# Patient Record
Sex: Female | Born: 1950
Health system: Southern US, Community
[De-identification: ages and names within clinical notes are randomized; demographics above are authoritative.]

## PROBLEM LIST (undated history)

## (undated) DIAGNOSIS — R600 Localized edema: Secondary | ICD-10-CM

## (undated) DIAGNOSIS — M5430 Sciatica, unspecified side: Secondary | ICD-10-CM

## (undated) DIAGNOSIS — R7303 Prediabetes: Secondary | ICD-10-CM

## (undated) DIAGNOSIS — M2550 Pain in unspecified joint: Secondary | ICD-10-CM

## (undated) DIAGNOSIS — M1990 Unspecified osteoarthritis, unspecified site: Secondary | ICD-10-CM

## (undated) DIAGNOSIS — Z91018 Allergy to other foods: Secondary | ICD-10-CM

## (undated) DIAGNOSIS — T7840XA Allergy, unspecified, initial encounter: Secondary | ICD-10-CM

## (undated) DIAGNOSIS — I10 Essential (primary) hypertension: Secondary | ICD-10-CM

## (undated) DIAGNOSIS — K219 Gastro-esophageal reflux disease without esophagitis: Secondary | ICD-10-CM

## (undated) HISTORY — DX: Allergy to other foods: Z91.018

## (undated) HISTORY — DX: Unspecified osteoarthritis, unspecified site: M19.90

## (undated) HISTORY — PX: BREAST SURGERY: SHX581

## (undated) HISTORY — DX: Localized edema: R60.0

## (undated) HISTORY — DX: Prediabetes: R73.03

## (undated) HISTORY — DX: Gastro-esophageal reflux disease without esophagitis: K21.9

## (undated) HISTORY — DX: Allergy, unspecified, initial encounter: T78.40XA

## (undated) HISTORY — DX: Pain in unspecified joint: M25.50

## (undated) HISTORY — DX: Essential (primary) hypertension: I10

## (undated) HISTORY — DX: Sciatica, unspecified side: M54.30

---

## 2013-01-08 LAB — HEMOGLOBIN A1C: Hemoglobin A1C: 5.9

## 2013-01-08 LAB — LIPID PANEL
Cholesterol: 199 mg/dL (ref 0–200)
HDL: 79 mg/dL — AB (ref 35–70)
LDL Cholesterol: 104 mg/dL
Triglycerides: 78 mg/dL (ref 40–160)

## 2013-01-08 LAB — BASIC METABOLIC PANEL
BUN: 18 mg/dL (ref 4–21)
Creatinine: 0.9 mg/dL (ref 0.5–1.1)
Glucose: 86 mg/dL
Potassium: 3.9 mmol/L (ref 3.4–5.3)
Sodium: 141 mmol/L (ref 137–147)

## 2013-01-08 LAB — HEPATIC FUNCTION PANEL
ALT: 20 U/L (ref 7–35)
AST: 21 U/L (ref 13–35)
Alkaline Phosphatase: 99 U/L (ref 25–125)
Bilirubin, Total: 0.8 mg/dL

## 2013-01-08 LAB — TSH: TSH: 1.77 u[IU]/mL (ref 0.41–5.90)

## 2013-01-08 LAB — MICROALBUMIN, URINE: Microalb, Ur: 1.2

## 2013-01-09 LAB — HM PAP SMEAR: HM Pap smear: NEGATIVE

## 2017-03-14 ENCOUNTER — Encounter: Payer: Self-pay | Admitting: Family Medicine

## 2017-03-14 ENCOUNTER — Ambulatory Visit (INDEPENDENT_AMBULATORY_CARE_PROVIDER_SITE_OTHER): Payer: Self-pay | Admitting: Family Medicine

## 2017-03-14 VITALS — BP 122/80 | HR 71 | Resp 12 | Ht 63.5 in | Wt 181.2 lb

## 2017-03-14 DIAGNOSIS — Z6831 Body mass index (BMI) 31.0-31.9, adult: Secondary | ICD-10-CM

## 2017-03-14 DIAGNOSIS — M19041 Primary osteoarthritis, right hand: Secondary | ICD-10-CM

## 2017-03-14 DIAGNOSIS — L84 Corns and callosities: Secondary | ICD-10-CM

## 2017-03-14 DIAGNOSIS — M19042 Primary osteoarthritis, left hand: Secondary | ICD-10-CM

## 2017-03-14 DIAGNOSIS — I10 Essential (primary) hypertension: Secondary | ICD-10-CM | POA: Insufficient documentation

## 2017-03-14 DIAGNOSIS — E669 Obesity, unspecified: Secondary | ICD-10-CM | POA: Insufficient documentation

## 2017-03-14 DIAGNOSIS — R7303 Prediabetes: Secondary | ICD-10-CM

## 2017-03-14 DIAGNOSIS — M19049 Primary osteoarthritis, unspecified hand: Secondary | ICD-10-CM | POA: Insufficient documentation

## 2017-03-14 LAB — BASIC METABOLIC PANEL
BUN: 19 mg/dL (ref 6–23)
CO2: 29 mEq/L (ref 19–32)
Calcium: 9.6 mg/dL (ref 8.4–10.5)
Chloride: 102 mEq/L (ref 96–112)
Creatinine, Ser: 0.88 mg/dL (ref 0.40–1.20)
GFR: 83.2 mL/min (ref >60.00)
Glucose, Bld: 83 mg/dL (ref 70–99)
Potassium: 3.9 mEq/L (ref 3.5–5.1)
Sodium: 140 mEq/L (ref 135–145)

## 2017-03-14 LAB — HEMOGLOBIN A1C: Hgb A1c MFr Bld: 5.9 % (ref 4.6–6.5)

## 2017-03-14 MED ORDER — TRIAMTERENE-HCTZ 37.5-25 MG PO TABS: 1.0000 | ORAL_TABLET | Freq: Every day | ORAL | 2 refills | Status: DC

## 2017-03-14 NOTE — Progress Notes (Signed)
HPI:   Robin Martinez is a 66 y.o. female, who is here today to establish care.  Former PCP: N/A Last preventive routine visit: 2016 in [place]. Colonoscopy right before moving to [place].   Chronic medical problems: HTN, allergies, OA, and prediabetes among some.   Hypertension:  Dx in her mid 45's. Currently on Triamterene -HCTZ 75-50 mg 1/2 tab daily, taking her husband's mediation after hers ran out.  She is not checking BP.  She is taking medications as instructed, no side effects reported.  She has not noted unusual headache, visual changes, exertional chest pain, dyspnea,  focal weakness, or edema.   Concerns today:   -IP joints "crooked", mild achy sometimes but has not noted significant stiffness and no limitation of ROM. She has had symptoms since 2016. She is not sure if it is getting worse.  She was having knee pain before but improved since she started Omega XL (OTC).  -Tender lesions of feet, recurrent for years. Pain is moderate, exacerbated by prolonged standing and walking, alleviated by peeling lesions off. She denies numbness, edema,or erythema.  - She exercises 3 times per week, she goes to the gym. She also tried to follow a healthy diet. She states that she has gained wt since she moved to [facility]. She was eating out frequently, now trying to cook more at home.    Review of Systems  Constitutional: Negative for activity change, appetite change, fatigue, fever and unexpected weight change.  HENT: Negative for mouth sores, nosebleeds and trouble swallowing.   Eyes: Negative for redness and visual disturbance.  Respiratory: Negative for cough, shortness of breath and wheezing.   Cardiovascular: Negative for chest pain, palpitations and leg swelling.  Gastrointestinal: Negative for abdominal pain, nausea and vomiting.       Negative for changes in bowel habits.  Endocrine: Negative for polydipsia, polyphagia and polyuria.  Genitourinary:  Negative for decreased urine volume, difficulty urinating, dysuria and hematuria.  Musculoskeletal: Positive for arthralgias. Negative for gait problem and myalgias.  Skin: Negative for rash.  Allergic/Immunologic: Positive for food allergies (Citrus fruits: rash if she "eats a lot.").  Neurological: Negative for syncope, weakness and headaches.  Psychiatric/Behavioral: Negative for confusion and sleep disturbance. The patient is not nervous/anxious.      No current outpatient prescriptions on file prior to visit.   No current facility-administered medications on file prior to visit.     Past Medical History:  Diagnosis Date  . Allergy   . Arthritis   . GERD (gastroesophageal reflux disease)   . Hypertension    No Known Allergies : Citrus fruits rash if eats a lot.  Family History  Problem Relation Age of Onset  . Hypertension Mother   . Dementia Mother   . Stroke Father   . Hypertension Father   . Cancer Maternal Grandmother        gyn ("vaginal")  . Diabetes Maternal Grandfather     Social History   Social History  . Marital status: Married    Spouse name: N/A  . Number of children: N/A  . Years of education: N/A   Social History Main Topics  . Smoking status: Never Smoker  . Smokeless tobacco: Never Used  . Alcohol use No  . Drug use: No  . Sexual activity: Not Currently   Other Topics Concern  . None   Social History Narrative  . None    Vitals:   03/14/17 1009  BP: 122/80  Pulse:  71  Resp: 12   O2 sat at RA 98% Body mass index is 31.6 kg/m.  Physical Exam  Nursing note and vitals reviewed. Constitutional: She is oriented to person, place, and time. She appears well-developed. No distress.  HENT:  Head: Atraumatic.  Mouth/Throat: Oropharynx is clear and moist and mucous membranes are normal.  Eyes: Conjunctivae and EOM are normal. Pupils are equal, round, and reactive to light.  Neck: No tracheal deviation present. No thyroid mass and no  thyromegaly present.  Cardiovascular: Normal rate and regular rhythm.   No murmur heard. Pulses:      Dorsalis pedis pulses are 2+ on the right side, and 2+ on the left side.  Respiratory: Effort normal and breath sounds normal. No respiratory distress.  GI: Soft. She exhibits no mass. There is no hepatomegaly. There is no tenderness.  Musculoskeletal: She exhibits no edema.       Feet:  No significant deformities appreciated, some nodular changes on IP joints, more noticeable on 5th left finger. No limitation of ROM. Knee crepitus bilateral, normal ROM, no tenderness or effusion.  Lymphadenopathy:    She has no cervical adenopathy.  Neurological: She is alert and oriented to person, place, and time. She has normal strength. Coordination normal.  Skin: Skin is warm. No rash noted. No erythema.  Thick hyperkeratotic rounded lesions (2-3 mm to 1.2 cm) on plantar aspect of both feet. One on left foot is tender with palpation (lateral). No erythema, edema,or drainage. See MS graphic for detail.  Psychiatric: She has a normal mood and affect.  Well groomed, good eye contact.      ASSESSMENT AND PLAN:    Gavin PoundDeborah was seen today for establish care.  Diagnoses and all orders for this visit:   Lab Results  Component Value Date   HGBA1C 5.9 03/14/2017   Lab Results  Component Value Date   CREATININE 0.88 03/14/2017   BUN 19 03/14/2017   NA 140 03/14/2017   K 3.9 03/14/2017   CL 102 03/14/2017   CO2 29 03/14/2017    Prediabetes  Healthy life style for primary prevention recommended. Further recommendations will be given according to lab results.  -     Basic metabolic panel -     Hemoglobin A1c  Hypertension, essential, benign  Adequately controlled. No changes in current management. DASH-low salt diet to continue. Eye exam recommended annually. F/U in 6 months, before if needed.  -     Basic metabolic panel -     triamterene-hydrochlorothiazide (MAXZIDE-25)  37.5-25 MG tablet; Take 1 tablet by mouth daily.  Plantar callus  OTC inserts or callus removal products may help. Educated about Dx and frequent recurrence. She can follow with podiatrists if needed.  Class 1 obesity without serious comorbidity with body mass index (BMI) of 31.0 to 31.9 in adult, unspecified obesity type  We discussed benefits of wt loss as well as adverse effects of obesity. Consistency with healthy diet and physical activity to continue.   Primary osteoarthritis of both hands  Educated about Dx, prognosis,and treatment options. It is mild, Omega XL has helped with knees, so continue. Tylenol OTC 500 mg tid if needed. F/U as needed.     Betty G. SwazilandJordan, MD  [facility]. [place] office.

## 2017-03-14 NOTE — Patient Instructions (Addendum)
A few things to remember from today's visit:   Prediabetes - Plan: Basic metabolic panel, Hemoglobin A1c  Hypertension, essential, benign - Plan: Basic metabolic panel, triamterene-hydrochlorothiazide (MAXZIDE-25) 37.5-25 MG tablet  Monitor blood at home.  Blood pressure goal for most people is less than 140/90. Some populations (older than 60) the goal is less than 150/90.  Most recent cardiologists' recommendations recommend blood pressure at or less than 130/80.   Elevated blood pressure increases the risk of strokes, heart and kidney disease, and eye problems. Regular physical activity and a healthy diet (DASH diet) usually help. Low salt diet. Take medications as instructed.  Caution with some over the counter medications as cold medications, dietary products (for weight loss), and Ibuprofen or Aleve (frequent use);all these medications could cause elevation of blood pressure.  Please be sure medication list is accurate. If a new problem present, please set up appointment sooner than planned today.

## 2017-03-16 ENCOUNTER — Encounter: Payer: Self-pay | Admitting: Family Medicine

## 2017-10-30 ENCOUNTER — Ambulatory Visit (INDEPENDENT_AMBULATORY_CARE_PROVIDER_SITE_OTHER): Payer: BLUE CROSS/BLUE SHIELD | Admitting: Family Medicine

## 2017-10-30 VITALS — BP 160/100 | HR 62 | Temp 98.1°F | Ht 63.5 in | Wt 185.2 lb

## 2017-10-30 DIAGNOSIS — R590 Localized enlarged lymph nodes: Secondary | ICD-10-CM | POA: Diagnosis not present

## 2017-10-30 DIAGNOSIS — Z9189 Other specified personal risk factors, not elsewhere classified: Secondary | ICD-10-CM | POA: Diagnosis not present

## 2017-10-30 DIAGNOSIS — Z13220 Encounter for screening for lipoid disorders: Secondary | ICD-10-CM | POA: Diagnosis not present

## 2017-10-30 DIAGNOSIS — I10 Essential (primary) hypertension: Secondary | ICD-10-CM | POA: Diagnosis not present

## 2017-10-30 DIAGNOSIS — Z1159 Encounter for screening for other viral diseases: Secondary | ICD-10-CM

## 2017-10-30 DIAGNOSIS — Z0000 Encounter for general adult medical examination without abnormal findings: Secondary | ICD-10-CM

## 2017-10-30 DIAGNOSIS — Z780 Asymptomatic menopausal state: Secondary | ICD-10-CM

## 2017-10-30 DIAGNOSIS — Z131 Encounter for screening for diabetes mellitus: Secondary | ICD-10-CM | POA: Diagnosis not present

## 2017-10-30 LAB — BASIC METABOLIC PANEL
BUN: 16 mg/dL (ref 6–23)
CO2: 34 mEq/L — ABNORMAL HIGH (ref 19–32)
Calcium: 9.5 mg/dL (ref 8.4–10.5)
Chloride: 102 mEq/L (ref 96–112)
Creatinine, Ser: 0.74 mg/dL (ref 0.40–1.20)
GFR: 101.41 mL/min (ref >60.00)
Glucose, Bld: 88 mg/dL (ref 70–99)
Potassium: 4 mEq/L (ref 3.5–5.1)
Sodium: 141 mEq/L (ref 135–145)

## 2017-10-30 LAB — LIPID PANEL
Cholesterol: 203 mg/dL — ABNORMAL HIGH (ref 0–200)
HDL: 74.2 mg/dL (ref >39.00)
LDL Cholesterol: 111 mg/dL — ABNORMAL HIGH (ref 0–99)
NonHDL: 128.48
Total CHOL/HDL Ratio: 3
Triglycerides: 89 mg/dL (ref 0.0–149.0)
VLDL: 17.8 mg/dL (ref 0.0–40.0)

## 2017-10-30 LAB — HEMOGLOBIN A1C: Hgb A1c MFr Bld: 5.7 % (ref 4.6–6.5)

## 2017-10-30 NOTE — Progress Notes (Signed)
HPI:   Robin Martinez is a 67 y.o. female, who is here today for her routine physical.  Last CPE: 2016 in [place] She was last see on 03/14/17.  Regular exercise 3 or more time per week: Yes Following a healthy diet: Yes She lives with her husband.  Chronic medical problems: Prediabetes.   Pap smear: In 01/2013 Hx of abnormal pap smears: Denies M: 10 G: 4 P:1 A:3 LMP: at 50.  Hx of STD's Denies. No sexually active.    There is no immunization history on file for this patient.  Mammogram: 10/2013. Colon cancer screening: Colonoscopy in 10/2013. DEXA: N/A  Hep C screening: Not done before and she would like to have it done today.  She has no concerns today.  BP elevated today. She is reporting home BP's 130's/80's.  HTN, currently she is on Triamterene - HCTZ 35-25 mg daily. Denies severe/frequent headache, visual changes, chest pain, dyspnea, palpitation, claudication, focal weakness, or edema.    Review of Systems   Constitutional: Negative for appetite change, fatigue, fever and unexpected weight change.  HENT: Negative for dental problem, hearing loss, mouth sores, nosebleeds, sore throat, trouble swallowing and voice change.   Eyes: Negative for photophobia and visual disturbance.  Respiratory: Negative for cough, shortness of breath and wheezing.   Cardiovascular: Negative for chest pain, palpitations and leg swelling.  Gastrointestinal: Negative for abdominal pain, blood in stool, nausea and vomiting.       No changes in bowel habits.  Endocrine: Negative for cold intolerance, heat intolerance, polydipsia, polyphagia and polyuria.  Genitourinary: Negative for decreased urine volume, dysuria, hematuria, menstrual problem, vaginal bleeding and vaginal discharge.       No breast tenderness or nipple discharge.  Musculoskeletal: Negative for gait problem and myalgias. + Arthralgias. Skin: Negative for color change and rash.  Neurological: Negative for  syncope, weakness, numbness and headaches.  Hematological: Negative for adenopathy. Does not bruise/bleed easily.  Psychiatric/Behavioral: Negative for confusion and sleep disturbance. The patient is not nervous/anxious.   All other systems reviewed and are negative.   Current Outpatient Medications on File Prior to Visit  Medication Sig Dispense Refill  . triamterene-hydrochlorothiazide (MAXZIDE-25) 37.5-25 MG tablet Take 1 tablet by mouth daily. 90 tablet 2   No current facility-administered medications on file prior to visit.      Past Medical History:  Diagnosis Date  . Allergy   . Arthritis   . GERD (gastroesophageal reflux disease)   . Hypertension      No Known Allergies  Family History  Problem Relation Age of Onset  . Hypertension Mother   . Dementia Mother   . Stroke Father   . Hypertension Father   . Cancer Maternal Grandmother        gyn ("vaginal")  . Diabetes Maternal Grandfather     Social History   Socioeconomic History  . Marital status: Married    Spouse name: Not on file  . Number of children: Not on file  . Years of education: Not on file  . Highest education level: Not on file  Social Needs  . Financial resource strain: Not on file  . Food insecurity - worry: Not on file  . Food insecurity - inability: Not on file  . Transportation needs - medical: Not on file  . Transportation needs - non-medical: Not on file  Occupational History  . Not on file  Tobacco Use  . Smoking status: Never Smoker  . Smokeless tobacco:  Never Used  Substance and Sexual Activity  . Alcohol use: No  . Drug use: No  . Sexual activity: Not Currently  Other Topics Concern  . Not on file  Social History Narrative  . Not on file     Vitals:   10/30/17 1051 10/30/17 1203  BP: (!) 158/110 (!) 160/100  Pulse: 62   Temp: 98.1 F (36.7 C)   SpO2: 97%    Body mass index is 32.29 kg/m.   Wt Readings from Last 3 Encounters:  10/30/17 185 lb 3.2 oz (84 kg)    03/14/17 181 lb 4 oz (82.2 kg)     Physical Exam   Nursing note and vitals reviewed. Constitutional: She is oriented to person, place, and time. She appears well-developed. No distress.  HENT:  Head: Normocephalic and atraumatic.  Right Ear: Hearing, tympanic membrane, external ear and ear canal normal.  Left Ear: Hearing, tympanic membrane, external ear and ear canal normal.  Mouth/Throat: Uvula is midline, oropharynx is clear and moist and mucous membranes are normal.  Eyes: Conjunctivae and EOM are normal. Pupils are equal, round, and reactive to light.  Neck: No tracheal deviation present. No thyromegaly present.  Cardiovascular: Normal rate and regular rhythm.  No murmur heard. Pulses:      Dorsalis pedis pulses are 2+ on the right side, and 2+ on the left side.    Respiratory: Effort normal and breath sounds normal. No respiratory distress.  GI: Soft. She exhibits no mass. There is no hepatomegaly. There is no tenderness.  Genitourinary:Breast: No masses,skin changes,or nipple discharge.  Musculoskeletal: She exhibits no edema.  No major deformity or signs of synovitis appreciated.  Lymphadenopathy:    She has no cervical adenopathy.    She has axillary adenopathy,tender left side, 1 cm. Right axillary neg.      Right: No supraclavicular adenopathy present.       Left: No supraclavicular adenopathy present.  Neurological: She is alert and oriented to person, place, and time. She has normal strength. No cranial nerve deficit. Coordination and gait normal.  Reflex Scores:      Bicep reflexes are 2+ on the right side and 2+ on the left side.      Patellar reflexes are 2+ on the right side and 2+ on the left side. Skin: Skin is warm. No rash noted. No erythema.  Psychiatric: She has a normal mood and affect. Cognition and memory are normal.  Well groomed, good eye contact.    ASSESSMENT AND PLAN:   Robin Martinez was seen today for annual exam.  Diagnoses and all orders  for this visit:  Lab Results  Component Value Date   CHOL 203 (H) 10/30/2017   HDL 74.20 10/30/2017   LDLCALC 111 (H) 10/30/2017   TRIG 89.0 10/30/2017   CHOLHDL 3 10/30/2017      Lab Results  Component Value Date   HGBA1C 5.7 10/30/2017   Lab Results  Component Value Date   CREATININE 0.74 10/30/2017   BUN 16 10/30/2017   NA 141 10/30/2017   K 4.0 10/30/2017   CL 102 10/30/2017   CO2 34 (H) 10/30/2017    Routine general medical examination at a health care facility  We discussed the importance of regular physical activity and healthy diet for prevention of chronic illness and/or complications. Preventive guidelines reviewed. Vaccination updated. Pap smear in 2014 negative for lesions or malignancy,HPV neg.She is not interested in having pap today, so we will plan on doing it next  year. Ca++ and vit D supplementation recommended. Next CPE in a year.  The 10-year ASCVD risk score Denman George(Goff [place] Montez HagemanJr., et al., 2013) is: 13.7%   Values used to calculate the score:     Age: [age] years     Sex: Female     Is Non-Hispanic African American: Yes     Diabetic: No     Tobacco smoker: No     Systolic Blood Pressure: 160 mmHg     Is BP treated: Yes     HDL Cholesterol: 74.2 mg/dL     Total Cholesterol: 203 mg/dL  Screening for lipid disorders -     Lipid panel  Diabetes mellitus screening -     Basic metabolic panel -     Hemoglobin A1c  Asymptomatic postmenopausal estrogen deficiency -     DEXAScan; Future  Lymphadenopathy, axillary -     Mammogram Digital Diagnostic Bilateral; Future  Encounter for HCV screening test for high risk patient -     Hepatitis C antibody screen  Hypertension, essential, benign  She is reporting normal BP readings at home. No changes in current management for now. Clearly instructed about warning signs. Recommend bringing BP monitoring next visit.   Return in 5 months (on 03/30/2018) for HTN.       Robin Franze G. Swazil[place]Jordan, MD  [facility]. [place] office.

## 2017-10-30 NOTE — Patient Instructions (Addendum)
A few things to remember from today's visit:   Routine general medical examination at a health care facility  Screening for lipid disorders - Plan: Lipid panel  Prediabetes  Asymptomatic postmenopausal estrogen deficiency - Plan: DEXAScan, Basic metabolic panel, Hemoglobin A1c  Lymphadenopathy, axillary - Plan: Mammogram Digital Diagnostic Bilateral  Encounter for HCV screening test for high risk patient - Plan: Hepatitis C antibody screen  Hypertension, essential, benign  Today you have you routine preventive visit.  Monitor blood pressure at home.   At least 150 minutes of moderate exercise per week, daily brisk walking for 15-30 min is a good exercise option. Healthy diet low in saturated (animal) fats and sweets and consisting of fresh fruits and vegetables, lean meats such as fish and white chicken and whole grains.  These are some of recommendations for screening depending of age and risk factors:   - Vaccines:  Tdap vaccine every 10 years.  Shingles vaccine recommended at age 65, could be given after 10950 years of age but not sure about insurance coverage.   Pneumonia vaccines:  Prevnar 13 at 65 and Pneumovax at 66. Sometimes Pneumovax is giving earlier if history of smoking, lung disease,diabetes,kidney disease among some.    Screening for diabetes at age 65 and every 3 years.  Cervical cancer prevention:   Pap smear every 3-5 years between women 30 and older if pap smear negative and HPV screening negative.   -Breast cancer: Mammogram: There is disagreement between experts about when to start screening in low risk asymptomatic female but recent recommendations are to start screening at 8040 and not later than 65 years old , every 1-2 years and after 65 yo q 2 years. Screening is recommended until 65 years old but some women can continue screening depending of healthy issues.   Colon cancer screening: starts at 65 years old until 65 years old.  Cholesterol  disorder screening at age 65 and every 3 years.  Also recommended:  1. Dental visit- Brush and floss your teeth twice daily; visit your dentist twice a year. 2. Eye doctor- Get an eye exam at least every 2 years. 3. Helmet use- Always wear a helmet when riding a bicycle, motorcycle, rollerblading or skateboarding. 4. Safe sex- If you may be exposed to sexually transmitted infections, use a condom. 5. Seat belts- Seat belts can save your live; always wear one. 6. Smoke/Carbon Monoxide detectors- These detectors need to be installed on the appropriate level of your home. Replace batteries at least once a year. 7. Skin cancer- When out in the sun please cover up and use sunscreen 15 SPF or higher. 8. Violence- If anyone is threatening or hurting you, please tell your healthcare provider.  9. Drink alcohol in moderation- Limit alcohol intake to one drink or less per day. Never drink and drive.  Please be sure medication list is accurate. If a new problem present, please set up appointment sooner than planned today.

## 2017-10-31 ENCOUNTER — Telehealth: Payer: Self-pay | Admitting: Family Medicine

## 2017-10-31 LAB — HEPATITIS C ANTIBODY
Hepatitis C Ab: NONREACTIVE
SIGNAL TO CUT-OFF: 0.01 (ref <1.00)

## 2017-10-31 NOTE — Telephone Encounter (Signed)
Copied from CRM [id number]. Topic: Inquiry >> Oct 31, 2017  8:24 AM Diana EvesHoyt, Maryann B wrote: Reason for CRM: pt wants to know where to call to get mammo scheduled.

## 2017-10-31 NOTE — Telephone Encounter (Signed)
[facility] Thanks, BJ

## 2017-11-01 NOTE — Telephone Encounter (Signed)
Spoke with patient on 10/31/2017, informed her that she could call [facility].

## 2017-11-05 ENCOUNTER — Encounter: Payer: Self-pay | Admitting: Family Medicine

## 2017-11-07 LAB — HM DEXA SCAN

## 2017-11-13 ENCOUNTER — Encounter: Payer: Self-pay | Admitting: Family Medicine

## 2017-12-21 ENCOUNTER — Other Ambulatory Visit: Payer: Self-pay | Admitting: Family Medicine

## 2017-12-21 DIAGNOSIS — R590 Localized enlarged lymph nodes: Secondary | ICD-10-CM

## 2018-01-16 ENCOUNTER — Other Ambulatory Visit: Payer: Self-pay | Admitting: Family Medicine

## 2018-01-16 DIAGNOSIS — I10 Essential (primary) hypertension: Secondary | ICD-10-CM

## 2018-02-09 ENCOUNTER — Encounter: Payer: Self-pay | Admitting: Family Medicine

## 2018-02-09 ENCOUNTER — Ambulatory Visit: Payer: BLUE CROSS/BLUE SHIELD | Admitting: Family Medicine

## 2018-02-09 VITALS — BP 120/78 | HR 68 | Temp 98.0°F | Resp 12 | Ht 63.5 in | Wt 179.2 lb

## 2018-02-09 DIAGNOSIS — J309 Allergic rhinitis, unspecified: Secondary | ICD-10-CM

## 2018-02-09 DIAGNOSIS — M20002 Unspecified deformity of left finger(s): Secondary | ICD-10-CM

## 2018-02-09 DIAGNOSIS — M19042 Primary osteoarthritis, left hand: Secondary | ICD-10-CM | POA: Diagnosis not present

## 2018-02-09 DIAGNOSIS — M19041 Primary osteoarthritis, right hand: Secondary | ICD-10-CM

## 2018-02-09 MED ORDER — FLUTICASONE PROPIONATE 50 MCG/ACT NA SUSP: 1.0000 | Freq: Two times a day (BID) | NASAL | 3 refills | Status: DC

## 2018-02-09 NOTE — Progress Notes (Signed)
ACUTE VISIT   HPI:  Chief Complaint  Patient presents with  . Joint issue    pinky finger, left hand, had for a long time  . Cough    started Monday  . Nasal Congestion    Ms.Tonnie Stillman is a 67 y.o. female, who is here today complaining of IP pain 5th finger left hand.  She has had this problem for years,exacerbated by cold weather. No alleviating factors identified. No erythema or edema.  She looked up information on line and thinks she has is "DC," and she would like to pursue treatment.   She is also c/o nasal congestion,post nasal drainage,and rhinorrhea for 5 days.  Taking Robitussin ,Dayquil,and Nyquil.  No fever,chills,soere throat,or body aches.  + Cough,which initially was productive and now is not. No wheezing,dyspnea,or hemoptysis. She has not identified exacerbating or alleviating factors.  Hx of allergies years ago.   Review of Systems  Constitutional: Negative for activity change, appetite change, fatigue and fever.  HENT: Positive for congestion, postnasal drip and rhinorrhea. Negative for ear pain, mouth sores, sinus pressure, sore throat and voice change.   Eyes: Negative for discharge, redness and itching.  Respiratory: Positive for cough. Negative for shortness of breath and wheezing.   Cardiovascular: Negative.   Gastrointestinal: Negative for abdominal pain, diarrhea, nausea and vomiting.  Musculoskeletal: Positive for arthralgias. Negative for myalgias.  Skin: Negative for rash.  Allergic/Immunologic: Positive for environmental allergies.  Neurological: Negative for weakness, numbness and headaches.  Hematological: Negative for adenopathy. Does not bruise/bleed easily.      Current Outpatient Medications on File Prior to Visit  Medication Sig Dispense Refill  . triamterene-hydrochlorothiazide (MAXZIDE-25) 37.5-25 MG tablet TAKE 1 TABLET BY MOUTH ONCE DAILY 90 tablet 2   No current facility-administered medications on file prior  to visit.      Past Medical History:  Diagnosis Date  . Allergy   . Arthritis   . GERD (gastroesophageal reflux disease)   . Hypertension    No Known Allergies  Social History   Socioeconomic History  . Marital status: Married    Spouse name: Not on file  . Number of children: Not on file  . Years of education: Not on file  . Highest education level: Not on file  Occupational History  . Not on file  Social Needs  . Financial resource strain: Not on file  . Food insecurity:    Worry: Not on file    Inability: Not on file  . Transportation needs:    Medical: Not on file    Non-medical: Not on file  Tobacco Use  . Smoking status: Never Smoker  . Smokeless tobacco: Never Used  Substance and Sexual Activity  . Alcohol use: No  . Drug use: No  . Sexual activity: Not Currently  Lifestyle  . Physical activity:    Days per week: Not on file    Minutes per session: Not on file  . Stress: Not on file  Relationships  . Social connections:    Talks on phone: Not on file    Gets together: Not on file    Attends religious service: Not on file    Active member of club or organization: Not on file    Attends meetings of clubs or organizations: Not on file    Relationship status: Not on file  Other Topics Concern  . Not on file  Social History Narrative  . Not on file    Vitals:  02/09/18 0918  BP: 120/78  Pulse: 68  Resp: 12  Temp: 98 F (36.7 C)  SpO2: 98%   Body mass index is 31.25 kg/m.   Physical Exam  Nursing note and vitals reviewed. Constitutional: She is oriented to person, place, and time. She appears well-developed. She does not appear ill. No distress.  HENT:  Head: Normocephalic and atraumatic.  Nose: Right sinus exhibits no maxillary sinus tenderness and no frontal sinus tenderness. Left sinus exhibits no maxillary sinus tenderness and no frontal sinus tenderness.  Mouth/Throat: Oropharynx is clear and moist and mucous membranes are normal.    Hypertrophic turbinates, nasal voice.   Eyes: Conjunctivae are normal.  Cardiovascular: Normal rate and regular rhythm.  No murmur heard. Respiratory: Effort normal and breath sounds normal. No respiratory distress.  Musculoskeletal: She exhibits no edema.  5th left finger DIP joint with fixed flexion. No signs of synovitis. No tenderness with palpation. No contractures or palpable nodules/cord in palm of left hand appreciated.   Lymphadenopathy:    She has no cervical adenopathy.  Neurological: She is alert and oriented to person, place, and time. She has normal strength. Gait normal.  Skin: Skin is warm. No rash noted. No erythema.  Psychiatric: She has a normal mood and affect. Her speech is normal.  Well groomed, good eye contact.     ASSESSMENT AND PLAN:   Ms. Randall was seen today for joint issue, cough and nasal congestion.  Diagnoses and all orders for this visit:  Finger deformity, left  Educated about signs and symptoms of dupuytren contracture, which I do not think is causing her symptoms. Most likely changes of DIP joint 5th finger is caused by OA.  Treatment options discussed,including surgical correction ,she prefers to hold on referral.  Primary osteoarthritis of both hands  Educated about symptoms. OTC Tylenol arthritis can be taken tid as needed.  F/U as needed.  Allergic rhinitis, unspecified seasonality, unspecified trigger  OTC Zyrtec or Allegra daily and Flonase nasal spray may help. Nasal irrigations with saline. F/U as needed.   -     fluticasone (FLONASE) 50 MCG/ACT nasal spray; Place 1 spray into both nostrils 2 (two) times daily.      Ellyssa Zagal G. [place], MD  [facility]. [place] office.

## 2018-02-09 NOTE — Patient Instructions (Addendum)
A few things to remember from today's visit:   Primary osteoarthritis of both hands  Allergic rhinitis, unspecified seasonality, unspecified trigger - Plan: fluticasone (FLONASE) 50 MCG/ACT nasal spray  Zyrtec 10 mg daily. There are 2 forms of allergic rhinitis: . Seasonal (hay fever): Caused by an allergy to pollen and/or mold spores in the air. Pollen is the fine powder that comes from the stamen of flowering plants. It can be carried through the air and is easily inhaled. Symptoms are seasonal and usually occur in spring, late summer, and fall. Marland Kitchen Perennial: Caused by other allergens such as dust mites, pet hair or dander, or mold. Symptoms occur year-round.  Symptoms: Your symptoms can vary, depending on the severity of your allergies. Symptoms can include: Sneezing, coughing.itching (mostly eyes, nose, mouth, throat and skin),runny nose,stuffy nose.headache,pressure in the nose and cheeks,ear fullness and popping, sore throat.watery, red, or swollen eyes,dark circles under your eyes,trouble smelling, and sometimes hives.  Allergic rhinitis cannot be prevented. You can help your symptoms by avoiding the things that you are allergic, including: . Keeping windows closed. This is especially important during high-pollen seasons. . Washing your hands after petting animals. . Using dust- and mite-proof bedding and mattress covers. . Wearing glasses outside to protect your eyes. . Showering before bed to wash off allergens from hair and skin. You can also avoid things that can make your symptoms worse, such as: . aerosol sprays . air pollution . cold temperatures . humidity . irritating fumes . tobacco smoke . wind . wood smoke.   Antihistamines help reduce the sneezing, runny nose, and itchiness of allergies. These come in pill form and as nasal sprays. Allegra,Zyrtec,or Claritin are some examples. Decongestants, such as pseudoephedrine and phenylephrine, help temporarily relieve the  stuffy nose of allergies. Decongestants are found in many medicines and come as pills, nose sprays, and nose drops. They could increase heart rate and cause tachycardia and tremor. Nasal Afrin should not be used for more than 3 days because you can become dependent on them. This causes you to feel even more stopped-up when you try to quit using them.  Nasal sprays: steroids or antihistaminics. Over the counter intranasal sterids: Nasocort,Rhinocort,or Flonase.You won't notice their benefits for up to 2 weeks after starting them. Allergy shots or sublingual tablets when other treatment do not help.This is done by immunologists.    Please be sure medication list is accurate. If a new problem present, please set up appointment sooner than planned today.

## 2018-02-10 DIAGNOSIS — M20002 Unspecified deformity of left finger(s): Secondary | ICD-10-CM | POA: Insufficient documentation

## 2018-06-04 DIAGNOSIS — R69 Illness, unspecified: Secondary | ICD-10-CM | POA: Diagnosis not present

## 2018-06-25 ENCOUNTER — Encounter: Payer: Self-pay | Admitting: Family Medicine

## 2018-06-25 ENCOUNTER — Ambulatory Visit (INDEPENDENT_AMBULATORY_CARE_PROVIDER_SITE_OTHER): Payer: Medicare HMO | Admitting: Family Medicine

## 2018-06-25 VITALS — BP 124/80 | HR 58 | Temp 98.2°F | Resp 16 | Ht 63.5 in | Wt 179.0 lb

## 2018-06-25 DIAGNOSIS — I83891 Varicose veins of right lower extremities with other complications: Secondary | ICD-10-CM | POA: Diagnosis not present

## 2018-06-25 DIAGNOSIS — J309 Allergic rhinitis, unspecified: Secondary | ICD-10-CM

## 2018-06-25 DIAGNOSIS — M79604 Pain in right leg: Secondary | ICD-10-CM | POA: Diagnosis not present

## 2018-06-25 MED ORDER — FLUTICASONE PROPIONATE 50 MCG/ACT NA SUSP: 1.0000 | Freq: Two times a day (BID) | NASAL | 6 refills | Status: DC

## 2018-06-25 MED ORDER — CETIRIZINE HCL 10 MG PO TABS: 10.0000 mg | ORAL_TABLET | Freq: Every day | ORAL | 3 refills | Status: DC

## 2018-06-25 NOTE — Patient Instructions (Addendum)
A few things to remember from today's visit:   Right leg pain  Varicose veins of lower extremities with complications, right  Allergic rhinitis, unspecified seasonality, unspecified trigger - Plan: fluticasone (FLONASE) 50 MCG/ACT nasal spray

## 2018-06-25 NOTE — Progress Notes (Signed)
ACUTE VISIT   HPI:  Chief Complaint  Patient presents with  . Right leg pain    Robin Martinez is a 67 y.o. female, who is here today complaining of RLE pain that started about 5 months ago. Problem started after prolonged sitting in the car, long trip. No edema or erythema but skin feels hot." She has Hx of varicose veins,has not noted skin ulcers or rash.  Pain starts on anterior aspect of right hip to medial aspect of thigh and knee.  Morning hip and knee stiffness.  Intermittent soreness, 5/10,and more noticeable at night when she is in bed,lying on her back. Occasional lower back pain,stable,no radiated.   Topical Hemp has helped.   She is also requesting "something for allergies." In the past she has used Flonase nasal spray and Zyrtec, she would like a prescription for both. Postnasal drainage, nasal congestion, and rhinorrhea. Occasional non productive cough.  No fever,chills,sore throat. No sick contact.   Review of Systems  Constitutional: Negative for activity change, appetite change, chills, fatigue and fever.  HENT: Positive for congestion, postnasal drip and sneezing. Negative for mouth sores, sinus pressure, sore throat, trouble swallowing and voice change.   Eyes: Negative for discharge, redness and itching.  Respiratory: Positive for cough. Negative for shortness of breath and wheezing.   Cardiovascular: Negative for chest pain, palpitations and leg swelling.  Gastrointestinal: Negative for abdominal pain, diarrhea, nausea and vomiting.  Musculoskeletal: Positive for arthralgias. Negative for back pain, joint swelling, myalgias and neck pain.  Skin: Negative for color change, pallor and rash.  Allergic/Immunologic: Positive for environmental allergies.  Neurological: Negative for weakness, numbness and headaches.  Psychiatric/Behavioral: Positive for sleep disturbance.      Current Outpatient Medications on File Prior to Visit    Medication Sig Dispense Refill  . triamterene-hydrochlorothiazide (MAXZIDE-25) 37.5-25 MG tablet TAKE 1 TABLET BY MOUTH ONCE DAILY 90 tablet 2   No current facility-administered medications on file prior to visit.      Past Medical History:  Diagnosis Date  . Allergy   . Arthritis   . GERD (gastroesophageal reflux disease)   . Hypertension    No Known Allergies  Social History   Socioeconomic History  . Marital status: Married    Spouse name: Not on file  . Number of children: Not on file  . Years of education: Not on file  . Highest education level: Not on file  Occupational History  . Not on file  Social Needs  . Financial resource strain: Not on file  . Food insecurity:    Worry: Not on file    Inability: Not on file  . Transportation needs:    Medical: Not on file    Non-medical: Not on file  Tobacco Use  . Smoking status: Never Smoker  . Smokeless tobacco: Never Used  Substance and Sexual Activity  . Alcohol use: No  . Drug use: No  . Sexual activity: Not Currently  Lifestyle  . Physical activity:    Days per week: Not on file    Minutes per session: Not on file  . Stress: Not on file  Relationships  . Social connections:    Talks on phone: Not on file    Gets together: Not on file    Attends religious service: Not on file    Active member of club or organization: Not on file    Attends meetings of clubs or organizations: Not on file  Relationship status: Not on file  Other Topics Concern  . Not on file  Social History Narrative  . Not on file    Vitals:   06/25/18 0944  BP: 124/80  Pulse: (!) 58  Resp: 16  Temp: 98.2 F (36.8 C)  SpO2: 98%   Body mass index is 31.21 kg/m.   Physical Exam  Nursing note and vitals reviewed. Constitutional: She is oriented to person, place, and time. She appears well-developed. No distress.  HENT:  Head: Normocephalic and atraumatic.  Nose: Right sinus exhibits no maxillary sinus tenderness and no  frontal sinus tenderness. Left sinus exhibits no maxillary sinus tenderness and no frontal sinus tenderness.  Mouth/Throat: Oropharynx is clear and moist and mucous membranes are normal.  Hypertrophic turbinates.   Eyes: Conjunctivae are normal.  Cardiovascular: Regular rhythm. Bradycardia present.  No murmur heard. Pulses:      Dorsalis pedis pulses are 2+ on the right side, and 2+ on the left side.  Tortuous varicose veins inner aspect of RLE, worse in thigh and warm to palpation,no tender or erythema. Other smaller varicose veins R>L  Respiratory: Effort normal and breath sounds normal. No respiratory distress.  GI: Soft. She exhibits no mass. There is no hepatomegaly. There is no tenderness.  Musculoskeletal: She exhibits no edema.       Right hip: She exhibits normal range of motion and no tenderness.       Right knee: She exhibits normal range of motion, no effusion and no erythema.       Lumbar back: She exhibits no tenderness and no bony tenderness.  Knee with crepitus upon flexion and extension. No tendernes upon palpation of right hip,anterior thigh or great trochanteric bursa.  Neurological: She is alert and oriented to person, place, and time. She has normal strength. No cranial nerve deficit. Gait normal.  SLR negative ,right.  Skin: Skin is warm. No rash noted. No erythema.  Psychiatric: She has a normal mood and affect.  Well groomed, good eye contact.      ASSESSMENT AND PLAN:   Ms. Robin Martinez was seen today for right leg pain.  Diagnoses and all orders for this visit:  Right leg pain  Possible causes discussed, I think it is mostly related to varicose vein disease. Radicular pain also to be consider. For now no further work up. Instructed about warning signs.  -     Ambulatory referral to Vascular Surgery  Varicose veins of lower extremities with complications, right  LE elevation,compression stocking recommended. Skin care to prevent venous ulcers.  -      Ambulatory referral to Vascular Surgery  Allergic rhinitis, unspecified seasonality, unspecified trigger  Saline nasal irrigations as needed. Resume Flonase nasal spray and Zyrtec 10 mg daily. F/U as needed.  -     fluticasone (FLONASE) 50 MCG/ACT nasal spray; Place 1 spray into both nostrils 2 (two) times daily. -     cetirizine (ZYRTEC) 10 MG tablet; Take 1 tablet (10 mg total) by mouth daily.     Return if symptoms worsen or fail to improve.     Betty G. [place], MD  [facility]. [place] office.

## 2018-06-28 ENCOUNTER — Encounter: Payer: Self-pay | Admitting: Family Medicine

## 2018-07-11 DIAGNOSIS — K08409 Partial loss of teeth, unspecified cause, unspecified class: Secondary | ICD-10-CM | POA: Diagnosis not present

## 2018-07-11 DIAGNOSIS — J309 Allergic rhinitis, unspecified: Secondary | ICD-10-CM | POA: Diagnosis not present

## 2018-07-11 DIAGNOSIS — E669 Obesity, unspecified: Secondary | ICD-10-CM | POA: Diagnosis not present

## 2018-07-11 DIAGNOSIS — Z823 Family history of stroke: Secondary | ICD-10-CM | POA: Diagnosis not present

## 2018-07-11 DIAGNOSIS — Z833 Family history of diabetes mellitus: Secondary | ICD-10-CM | POA: Diagnosis not present

## 2018-07-11 DIAGNOSIS — I10 Essential (primary) hypertension: Secondary | ICD-10-CM | POA: Diagnosis not present

## 2018-07-11 DIAGNOSIS — R32 Unspecified urinary incontinence: Secondary | ICD-10-CM | POA: Diagnosis not present

## 2018-07-12 ENCOUNTER — Other Ambulatory Visit: Payer: Self-pay

## 2018-07-12 DIAGNOSIS — S46819S Strain of other muscles, fascia and tendons at shoulder and upper arm level, unspecified arm, sequela: Secondary | ICD-10-CM

## 2018-07-12 DIAGNOSIS — I83811 Varicose veins of right lower extremities with pain: Secondary | ICD-10-CM

## 2018-07-18 ENCOUNTER — Ambulatory Visit (INDEPENDENT_AMBULATORY_CARE_PROVIDER_SITE_OTHER): Payer: Medicare HMO | Admitting: Vascular Surgery

## 2018-07-18 ENCOUNTER — Ambulatory Visit (HOSPITAL_COMMUNITY)
Admission: RE | Admit: 2018-07-18 | Discharge: 2018-07-18 | Disposition: A | Discharge status: Home / Self Care | Payer: Medicare HMO | Source: Ambulatory Visit | Attending: Vascular Surgery | Admitting: Vascular Surgery

## 2018-07-18 ENCOUNTER — Encounter: Payer: Self-pay | Admitting: Vascular Surgery

## 2018-07-18 ENCOUNTER — Other Ambulatory Visit: Payer: Self-pay

## 2018-07-18 VITALS — BP 137/88 | HR 78 | Temp 98.1°F | Resp 18 | Ht 63.0 in | Wt 177.7 lb

## 2018-07-18 DIAGNOSIS — I83811 Varicose veins of right lower extremities with pain: Secondary | ICD-10-CM | POA: Diagnosis not present

## 2018-07-18 NOTE — Progress Notes (Signed)
Referring Physician: Dr Betty [place]  Patient name: [name] MRN: [id number] DOB: 05-24-1951 Sex: female  REASON FOR CONSULT: Symptomatic varicose veins with pain and swelling  HPI: Robin Martinez is a 67 y.o. female, with a long-standing history of varicose veins of about 30 years.  These developed after previous pregnancy.  Over the last 2 to 3 years they have become more symptomatic with pain swelling and aching.  This is somewhat improved by applying Tiger balm.  He has not worn compression stockings in several years.  Pain primarily occurs on the lateral right thigh posterior calf and medial knee of the right leg she does have a family history of varicose veins in her mother.  She denies prior history of DVT.    Past Medical History:  Diagnosis Date  . Allergy   . Arthritis   . GERD (gastroesophageal reflux disease)   . Hypertension    Past Surgical History:  Procedure Laterality Date  . BREAST SURGERY     biopsy    Family History  Problem Relation Age of Onset  . Hypertension Mother   . Dementia Mother   . Stroke Father   . Hypertension Father   . Cancer Maternal Grandmother        gyn ("vaginal")  . Diabetes Maternal Grandfather     SOCIAL HISTORY: Social History   Socioeconomic History  . Marital status: Married    Spouse name: Not on file  . Number of children: Not on file  . Years of education: Not on file  . Highest education level: Not on file  Occupational History  . Not on file  Social Needs  . Financial resource strain: Not on file  . Food insecurity:    Worry: Not on file    Inability: Not on file  . Transportation needs:    Medical: Not on file    Non-medical: Not on file  Tobacco Use  . Smoking status: Never Smoker  . Smokeless tobacco: Never Used  Substance and Sexual Activity  . Alcohol use: No  . Drug use: No  . Sexual activity: Not Currently  Lifestyle  . Physical activity:    Days per week: Not on file    Minutes per session:  Not on file  . Stress: Not on file  Relationships  . Social connections:    Talks on phone: Not on file    Gets together: Not on file    Attends religious service: Not on file    Active member of club or organization: Not on file    Attends meetings of clubs or organizations: Not on file    Relationship status: Not on file  . Intimate partner violence:    Fear of current or ex partner: Not on file    Emotionally abused: Not on file    Physically abused: Not on file    Forced sexual activity: Not on file  Other Topics Concern  . Not on file  Social History Narrative  . Not on file    No Known Allergies  Current Outpatient Medications  Medication Sig Dispense Refill  . fluticasone (FLONASE) 50 MCG/ACT nasal spray Place 1 spray into both nostrils 2 (two) times daily. 16 g 6  . triamterene-hydrochlorothiazide (MAXZIDE-25) 37.5-25 MG tablet TAKE 1 TABLET BY MOUTH ONCE DAILY 90 tablet 2  . cetirizine (ZYRTEC) 10 MG tablet Take 1 tablet (10 mg total) by mouth daily. (Patient not taking: Reported on 07/18/2018) 90 tablet 3  No current facility-administered medications for this visit.     ROS:   General:  No weight loss, Fever, chills  HEENT: No recent headaches, no nasal bleeding, no visual changes, no sore throat  Neurologic: No dizziness, blackouts, seizures. No recent symptoms of stroke or mini- stroke. No recent episodes of slurred speech, or temporary blindness.  Cardiac: No recent episodes of chest pain/pressure, no shortness of breath at rest.  No shortness of breath with exertion.  Denies history of atrial fibrillation or irregular heartbeat  Vascular: No history of rest pain in feet.  No history of claudication.  No history of non-healing ulcer, No history of DVT   Pulmonary: No home oxygen, no productive cough, no hemoptysis,  No asthma or wheezing  Musculoskeletal:  [ ]  Arthritis, [ ]  Low back pain,  [ ]  Joint pain  Hematologic:No history of hypercoagulable state.   No history of easy bleeding.  No history of anemia  Gastrointestinal: No hematochezia or melena,  No gastroesophageal reflux, no trouble swallowing  Urinary: [ ]  chronic Kidney disease, [ ]  on HD - [0] MWF or [ ]  TTHS, [ ]  Burning with urination, [ ]  Frequent urination, [ ]  Difficulty urinating;   Skin: No rashes  Psychological: No history of anxiety,  No history of depression   Physical Examination  Vitals:   07/18/18 1431  BP: 137/88  Pulse: 78  Resp: 18  Temp: 98.1 F (36.7 C)  TempSrc: Oral  SpO2: 97%  Weight: 177 lb 11.2 oz (80.6 kg)  Height: 5\' 3"  (1.6 m)    Body mass index is 31.48 kg/m.  General:  Alert and oriented, no acute distress HEENT: Normal Neck: No bruit or JVD Pulmonary: Clear to auscultation bilaterally Cardiac: Regular Rate and Rhythm without murmur Abdomen: Soft, non-tender, non-distended, no mass Skin: No rash, use enlargement tortuous varicosities right medial thigh extending down into the calf, reticular type varicosities right lateral thigh, no ulceration         Extremity Pulses:  2+ radial, brachial, femoral, dorsalis pedis, posterior tibial pulses bilaterally Musculoskeletal: No deformity or edema  Neurologic: Upper and lower extremity motor 5/5 and symmetric  DATA:  Patient had a venous reflux exam today.  This showed evidence of deep vein reflux in the common femoral vein.  She also had diffuse reflux especially throughout the thigh segment of the right greater saphenous vein.  There was significant tortuosity.  ASSESSMENT: Diffuse reflux right greater saphenous vein with enlargement of varicosities and pain   PLAN: Patient was given a prescription today for lower extremity compression stockings.  She will try these for the next 3 months.  She will follow-up in 3 months time to see if she has had any improvement in symptoms.  If not we would consider right leg vein stripping in the operating room with stab avulsions.  I do not  believe that she would be a very good candidate for laser ablation due to the severe tortuosity of the saphenous vein extending from the mid calf all the way up into the distal thigh.   Fabienne Bruns, MD [facility] Office: [phone] Pager: [phone]

## 2018-09-14 ENCOUNTER — Encounter

## 2018-09-14 ENCOUNTER — Encounter (HOSPITAL_COMMUNITY): Payer: Medicare HMO

## 2018-09-14 ENCOUNTER — Encounter: Payer: Medicare HMO | Admitting: Vascular Surgery

## 2018-10-17 ENCOUNTER — Ambulatory Visit: Payer: Medicare Other | Admitting: Vascular Surgery

## 2018-10-17 ENCOUNTER — Other Ambulatory Visit: Payer: Self-pay

## 2018-10-17 ENCOUNTER — Ambulatory Visit: Payer: Medicare HMO | Admitting: Vascular Surgery

## 2018-10-17 ENCOUNTER — Encounter: Payer: Self-pay | Admitting: Vascular Surgery

## 2018-10-17 VITALS — BP 140/91 | HR 77 | Temp 97.3°F | Resp 18 | Ht 63.0 in | Wt 178.0 lb

## 2018-10-17 DIAGNOSIS — I83811 Varicose veins of right lower extremities with pain: Secondary | ICD-10-CM

## 2018-10-17 NOTE — Progress Notes (Addendum)
Patient is a 68 year old female with a longstanding history of varicose veins for almost 30 years.  She returns today for follow-up.  She was last seen October 2019.  Her varicose veins developed after pregnancy.  They have become more progressive with more swelling and aching over the last 2 to 3 years.  They are also somewhat more prominent.  Her symptoms are somewhat improved by Tiger balm ointment as well as the compression stockings that we prescribed for her 3 months ago.  However this has not completely alleviated all of her symptoms.  She does have a family history of varicose veins in her mother.  She denies prior history of DVT.  Past Medical History:  Diagnosis Date  . Allergy   . Arthritis   . GERD (gastroesophageal reflux disease)   . Hypertension     Past Surgical History:  Procedure Laterality Date  . BREAST SURGERY     biopsy    Current Outpatient Medications on File Prior to Visit  Medication Sig Dispense Refill  . fluticasone (FLONASE) 50 MCG/ACT nasal spray Place 1 spray into both nostrils 2 (two) times daily. 16 g 6  . triamterene-hydrochlorothiazide (MAXZIDE-25) 37.5-25 MG tablet TAKE 1 TABLET BY MOUTH ONCE DAILY 90 tablet 2  . cetirizine (ZYRTEC) 10 MG tablet Take 1 tablet (10 mg total) by mouth daily. (Patient not taking: Reported on 07/18/2018) 90 tablet 3   No current facility-administered medications on file prior to visit.     Review of systems: She has no shortness of breath.  She has no chest pain.  Physical exam:  Vitals:   10/17/18 1433  BP: (!) 140/91  Pulse: 77  Resp: 18  Temp: (!) 97.3 F (36.3 C)  TempSrc: Oral  SpO2: 99%  Weight: 178 lb (80.7 kg)  Height: 5\' 3"  (1.6 m)    Right lower extremity: Tortuous greater saphenous vein extending from proximal calf to mid thigh.  I used the SonoSite ultrasound today at the bedside to examine the vein.  It has a fairly straight course extending from the saphenofemoral junction to the mid thigh and then  becomes quite tortuous all the way to the knee with multiple tributaries and branches extending off of this.  Skin: No open ulcer or rash  Assessment: Patient with symptomatic varicose veins with reflux right lower extremity tortuosity extending from the mid thigh to the proximal calf  Plan: Patient wishes to have an intervention for her varicose veins at this point.  We will get approval through her insurance for laser ablation of her right greater saphenous vein with additional stab phlebectomies to improve her overall symptoms.  We will call the patient regarding scheduling after approval.  She will continue to wear compression stockings meanwhile.  Fabienne Bruns, MD [facility] Office: [phone] Pager: [phone]

## 2018-10-19 ENCOUNTER — Other Ambulatory Visit: Payer: Self-pay | Admitting: *Deleted

## 2018-10-19 DIAGNOSIS — I10 Essential (primary) hypertension: Secondary | ICD-10-CM

## 2018-10-19 MED ORDER — TRIAMTERENE-HCTZ 37.5-25 MG PO TABS: 1.0000 | ORAL_TABLET | Freq: Every day | ORAL | 2 refills | Status: DC

## 2018-11-13 ENCOUNTER — Other Ambulatory Visit: Payer: Self-pay | Admitting: *Deleted

## 2018-11-13 DIAGNOSIS — I83811 Varicose veins of right lower extremities with pain: Secondary | ICD-10-CM

## 2018-12-12 ENCOUNTER — Encounter: Payer: Self-pay | Admitting: Vascular Surgery

## 2018-12-12 ENCOUNTER — Other Ambulatory Visit: Payer: Self-pay

## 2018-12-12 ENCOUNTER — Ambulatory Visit: Payer: Medicare Other | Admitting: Vascular Surgery

## 2018-12-12 VITALS — BP 149/90 | HR 69 | Temp 97.3°F | Resp 16 | Ht 63.0 in | Wt 178.0 lb

## 2018-12-12 DIAGNOSIS — I83811 Varicose veins of right lower extremities with pain: Secondary | ICD-10-CM

## 2018-12-12 HISTORY — PX: ENDOVENOUS ABLATION SAPHENOUS VEIN W/ LASER: SUR449

## 2018-12-12 NOTE — Progress Notes (Signed)
     Laser Ablation Procedure    Date: 12/12/2018   Robin Martinez DOB:[birth year]  Consent signed: Yes    Surgeon:  Dr. Fabienne Bruns   Procedure: Laser Ablation: right Greater Saphenous Vein  BP (!) 149/90 (BP Location: Left Arm, Patient Position: Sitting, Cuff Size: Normal)   Pulse 69   Temp (!) 97.3 F (36.3 C) (Oral)   Resp 16   Ht 5\' 3"  (1.6 m)   Wt 178 lb (80.7 kg)   SpO2 100%   BMI 31.53 kg/m   Tumescent Anesthesia: 200 cc 0.9% NaCl with 50 cc Lidocaine HCL 1% and 15 cc 8.4% NaHCO3  Local Anesthesia: 4 cc Lidocaine HCL and NaHCO3 (ratio 2:1)  15 watts continuous mode        Total energy: 1,074 Joules    Total time: 1:11   Patient tolerated procedure well    Description of Procedure:  After marking the course of the secondary varicosities, the patient was placed on the operating table in the supine position, and the right leg was prepped and draped in sterile fashion.   Local anesthetic was administered and under ultrasound guidance the saphenous vein was accessed with a micro needle and guide wire; then the mirco puncture sheath was placed.  A guide wire was inserted saphenofemoral junction , followed by a 5 french sheath. The wire would not completely cross the junction nor the sheath or laser fiber so I positioned the sheath and laser about 3 cm below the junction. The position of the sheath and then the laser fiber below the junction was confirmed using the ultrasound.  Tumescent anesthesia was administered along the course of the saphenous vein using ultrasound guidance. The patient was placed in Trendelenburg position and protective laser glasses were placed on patient and staff, and the laser was fired at 15 watts continuous mode advancing 1-1mm/second for a total of 1074 joules.    Steri strip was applied to the IV insertion site and ABD pads and thigh high compression stockings were applied.  Ace wrap bandages were applied over the right thigh and at the top of the  saphenofemoral junction. Blood loss was less than 15 cc.  The patient ambulated out of the operating room having tolerated the procedure well.  Fabienne Bruns, MD [facility] [place] Office: [phone] Pager: [phone]

## 2018-12-20 ENCOUNTER — Encounter: Payer: Self-pay | Admitting: Vascular Surgery

## 2018-12-20 ENCOUNTER — Ambulatory Visit (HOSPITAL_COMMUNITY)
Admission: RE | Admit: 2018-12-20 | Discharge: 2018-12-20 | Disposition: A | Discharge status: Home / Self Care | Payer: Medicare Other | Source: Ambulatory Visit | Attending: Vascular Surgery | Admitting: Vascular Surgery

## 2018-12-20 ENCOUNTER — Ambulatory Visit (INDEPENDENT_AMBULATORY_CARE_PROVIDER_SITE_OTHER): Payer: Medicare Other | Admitting: Vascular Surgery

## 2018-12-20 ENCOUNTER — Other Ambulatory Visit: Payer: Self-pay

## 2018-12-20 VITALS — BP 142/87 | HR 69 | Temp 97.8°F | Resp 16 | Ht 63.0 in | Wt 178.0 lb

## 2018-12-20 DIAGNOSIS — I83811 Varicose veins of right lower extremities with pain: Secondary | ICD-10-CM | POA: Diagnosis not present

## 2018-12-20 NOTE — Progress Notes (Signed)
Patient is a 68 year old female who returns for follow-up today.  Laser ablation of her right greater saphenous approximately 1 week ago.  She reports minimal pain at this point.  Her cannulation site was mid anterior thigh due to tortuosity in the more medial segment.  On ultrasound today there is good closure of the accessory saphenous.  There is still some reflux in the mid right greater saphenous.  Unfortunately due to tortuosity I do not believe the laser will pass around this.  Physical exam:  Vitals:   12/20/18 1036  BP: (!) 142/87  Pulse: 69  Resp: 16  Temp: 97.8 F (36.6 C)  TempSrc: Oral  SpO2: 97%  Weight: 178 lb (80.7 kg)  Height: 5\' 3"  (1.6 m)    Right lower extremity: Well-healed skin incision, multiple varicosities right medial thigh centered around the medial aspect of the right leg and knee.  Data: Duplex ultrasound no DVT exclusion of the anterior right greater saphenous  Assessment: We will see how the patient does with her current vein closure procedure.  She will follow-up with [place] in 3 months time.  She will continue to wear her compression stockings.  If she has not had complete relief of her symptoms we will consider stab avulsions in the right leg.  Fabienne Bruns, MD [facility] [place] Office: [phone] Pager: [phone]

## 2019-04-03 ENCOUNTER — Ambulatory Visit: Payer: Medicare Other | Admitting: Vascular Surgery

## 2019-04-17 ENCOUNTER — Ambulatory Visit (INDEPENDENT_AMBULATORY_CARE_PROVIDER_SITE_OTHER): Payer: Medicare Other | Admitting: Vascular Surgery

## 2019-04-17 ENCOUNTER — Other Ambulatory Visit: Payer: Self-pay

## 2019-04-17 ENCOUNTER — Encounter: Payer: Self-pay | Admitting: Vascular Surgery

## 2019-04-17 VITALS — BP 143/92 | HR 68 | Temp 97.7°F | Resp 18 | Ht 63.0 in | Wt 178.0 lb

## 2019-04-17 DIAGNOSIS — I83811 Varicose veins of right lower extremities with pain: Secondary | ICD-10-CM

## 2019-04-17 NOTE — Progress Notes (Signed)
Patient is a 68 year old female who returns for follow-up today.  She underwent laser ablation of her right greater saphenous vein in March 2020.  She returns for further follow-up today for consideration of stab avulsions.  She states that although the veins in her right leg decompressed a little bit she still has symptoms of aching and pain over the varicosities and has not had complete resolution of her symptoms.  He has continued to wear her compression stockings in the right leg.  Review of systems: She denies shortness of breath.  She denies chest pain.  Past Medical History:  Diagnosis Date  . Allergy   . Arthritis   . GERD (gastroesophageal reflux disease)   . Hypertension    Physical exam:  Vitals:   04/17/19 1424  BP: (!) 143/92  Pulse: 68  Resp: 18  Temp: 97.7 F (36.5 C)  TempSrc: Temporal  SpO2: 99%  Weight: 178 lb (80.7 kg)  Height: 5\' 3"  (1.6 m)    Extremities: No significant tenderness over the cannula insertion site in the right medial leg.    Vascular: 2+ dorsalis pedis pulse right foot      Assessment: Symptomatic varicose veins right leg.  She has not had complete resolution of her symptoms with laser ablation of the right greater saphenous vein as well as compression stockings.  Plan: Stab avulsions right leg for relief of varicose vein pain symptoms.  This will be scheduled in the near future pending insurance approval.  Ruta Hinds, MD [facility] [place]: [phone] Pager: [phone]

## 2019-04-26 ENCOUNTER — Other Ambulatory Visit: Payer: Self-pay

## 2019-04-26 ENCOUNTER — Encounter: Payer: Self-pay | Admitting: Family Medicine

## 2019-04-26 ENCOUNTER — Ambulatory Visit (INDEPENDENT_AMBULATORY_CARE_PROVIDER_SITE_OTHER): Payer: Medicare Other | Admitting: Family Medicine

## 2019-04-26 DIAGNOSIS — B070 Plantar wart: Secondary | ICD-10-CM | POA: Diagnosis not present

## 2019-04-26 NOTE — Progress Notes (Signed)
Virtual Visit via Video Note  I connected with Robin Martinez on 04/26/19 at 11:30 AM EDT by a video enabled telemedicine application and verified that I am speaking with the correct person using two identifiers.  Location patient: home Location provider:work or home office Persons participating in the virtual visit: patient, provider  I discussed the limitations of evaluation and management by telemedicine and the availability of in person appointments. The patient expressed understanding and agreed to proceed.   HPI: Pt is a 68 yo female, pt of Dr. Doug Sou, with [facility] sig for HTN, GERD, OA who was seen for ongoing concern.  Pt using OTC freeze spray for plantar warts x 3 yrs.  The spray has made the warts go down, but they return.  Has also used the medicated disc.  States now her feet are becoming sore on the bottom.  Pt has 2 on soles of each foot.   ROS: See pertinent positives and negatives per HPI.  Past Medical History:  Diagnosis Date  . Allergy   . Arthritis   . GERD (gastroesophageal reflux disease)   . Hypertension     Past Surgical History:  Procedure Laterality Date  . BREAST SURGERY     biopsy  . ENDOVENOUS ABLATION SAPHENOUS VEIN W/ LASER Right 12/12/2018   endovenous laser ablation right greater saphenous vein by Ruta Hinds MD    Family History  Problem Relation Age of Onset  . Hypertension Mother   . Dementia Mother   . Stroke Father   . Hypertension Father   . Cancer Maternal Grandmother        gyn ("vaginal")  . Diabetes Maternal Grandfather      Current Outpatient Medications:  .  fluticasone (FLONASE) 50 MCG/ACT nasal spray, Place 1 spray into both nostrils 2 (two) times daily., Disp: 16 g, Rfl: 6 .  triamterene-hydrochlorothiazide (MAXZIDE-25) 37.5-25 MG tablet, Take 1 tablet by mouth daily., Disp: 90 tablet, Rfl: 2  EXAM:  VITALS per patient if applicable: RR between 41-32 bpm  GENERAL: alert, oriented, appears well and in no acute  distress  HEENT: atraumatic, conjunctiva clear, no obvious abnormalities on inspection of external nose and ears  NECK: normal movements of the head and neck  LUNGS: on inspection no signs of respiratory distress, breathing rate appears normal, no obvious gross SOB, gasping or wheezing  CV: no obvious cyanosis  MS: moves all visible extremities without noticeable abnormality  SKIN:  Plantar warts noted on soles of b/l feet.  PSYCH/NEURO: pleasant and cooperative, no obvious depression or anxiety, speech and thought processing grossly intact  ASSESSMENT AND PLAN:  Discussed the following assessment and plan:  Plantar warts  -discussed cause and various options for removal - Plan: Ambulatory referral to Podiatry  F/u prn   I discussed the assessment and treatment plan with the patient. The patient was provided an opportunity to ask questions and all were answered. The patient agreed with the plan and demonstrated an understanding of the instructions.   The patient was advised to call back or seek an in-person evaluation if the symptoms worsen or if the condition fails to improve as anticipated.  Billie Ruddy, MD

## 2019-04-27 ENCOUNTER — Other Ambulatory Visit: Payer: Self-pay | Admitting: Family Medicine

## 2019-04-27 DIAGNOSIS — I10 Essential (primary) hypertension: Secondary | ICD-10-CM

## 2019-04-27 DIAGNOSIS — J309 Allergic rhinitis, unspecified: Secondary | ICD-10-CM

## 2019-05-09 ENCOUNTER — Ambulatory Visit: Payer: Medicare Other | Admitting: Podiatry

## 2019-05-09 ENCOUNTER — Other Ambulatory Visit: Payer: Self-pay

## 2019-05-09 ENCOUNTER — Encounter: Payer: Self-pay | Admitting: Podiatry

## 2019-05-09 VITALS — BP 155/90 | Temp 97.8°F

## 2019-05-09 DIAGNOSIS — M2042 Other hammer toe(s) (acquired), left foot: Secondary | ICD-10-CM | POA: Diagnosis not present

## 2019-05-09 DIAGNOSIS — M2041 Other hammer toe(s) (acquired), right foot: Secondary | ICD-10-CM | POA: Diagnosis not present

## 2019-05-09 DIAGNOSIS — Q828 Other specified congenital malformations of skin: Secondary | ICD-10-CM

## 2019-05-09 NOTE — Progress Notes (Signed)
Subjective:   Patient ID: Robin Martinez, female   DOB: 68 y.o.   MRN: [id number]   HPI Patient presents stating she has lesions on the bottom of both feet which become painful and make it hard for her to be active.  States that at times the pain is stabbing and it started with one and then it is grown into 4 different lesions that are painful.  Patient does not smoke likes to be active   Review of Systems  All other systems reviewed and are negative.       Objective:  Physical Exam Vitals signs and nursing note reviewed.  Constitutional:      Appearance: She is well-developed.  Pulmonary:     Effort: Pulmonary effort is normal.  Musculoskeletal: Normal range of motion.  Skin:    General: Skin is warm.  Neurological:     Mental Status: She is alert.     Neurovascular status intact muscle strength was found to be adequate range of motion within normal limits.  Patient is found to have 3 lesions on the left foot 1 on the right that are painful when pressed and making walking difficult.  They are keratotic with upon debridement lucent bases and I did not note pinpoint bleeding or pain to lateral pressure with most pain on direct pressure.  Patient was found to have good digital perfusion well oriented x3 and did have digital deformities noted bilateral with elevation of the lesser digits     Assessment:  Lesions that are most likely porokeratotic in nature along with hammertoe deformity bilateral     Plan:  H&P discussed conditions and at this point sharp sterile debridement accomplished with no iatrogenic bleeding and discussed digital deformities and recommended shoe gear modifications.  We will see the patient back and make decisions for other treatment based on response to this aggressive conservative therapy performed today

## 2019-06-05 ENCOUNTER — Encounter: Payer: Self-pay | Admitting: Family Medicine

## 2019-06-05 ENCOUNTER — Telehealth (INDEPENDENT_AMBULATORY_CARE_PROVIDER_SITE_OTHER): Payer: Medicare Other | Admitting: Family Medicine

## 2019-06-05 ENCOUNTER — Other Ambulatory Visit: Payer: Self-pay

## 2019-06-05 VITALS — BP 170/85 | HR 81 | Ht 63.5 in

## 2019-06-05 DIAGNOSIS — J309 Allergic rhinitis, unspecified: Secondary | ICD-10-CM

## 2019-06-05 DIAGNOSIS — R7303 Prediabetes: Secondary | ICD-10-CM | POA: Diagnosis not present

## 2019-06-05 DIAGNOSIS — I10 Essential (primary) hypertension: Secondary | ICD-10-CM | POA: Diagnosis not present

## 2019-06-05 DIAGNOSIS — E785 Hyperlipidemia, unspecified: Secondary | ICD-10-CM | POA: Insufficient documentation

## 2019-06-05 MED ORDER — FLUTICASONE PROPIONATE 50 MCG/ACT NA SUSP: NASAL | 3 refills | Status: DC

## 2019-06-05 MED ORDER — TRIAMTERENE-HCTZ 37.5-25 MG PO TABS: 1.0000 | ORAL_TABLET | Freq: Every day | ORAL | 1 refills | Status: DC

## 2019-06-05 NOTE — Progress Notes (Signed)
Virtual Visit via Video Note   I connected with Robin Martinez on 06/05/19 by a video enabled telemedicine application and verified that I am speaking with the correct person using two identifiers.  Location patient: home Location provider:work office Persons participating in the virtual visit: patient, provider  I discussed the limitations of evaluation and management by telemedicine and the availability of in person appointments. The patient expressed understanding and agreed to proceed.   HPI: Robin Martinez is a 68 year old female with history of allergy rhinitis, hypertension, and hyperlipidemia who is requesting refills on Flonase nasal spray and Maxide. Last follow-up on hypertension was in 10/2017.  She has not been checking BP regularly. Currently she is on triamterene-HCTZ 37.5-25 mg daily. She took BP during visit x 2 and elevated at 170/85 and 189/86. Denies severe/frequent headache, visual changes, chest pain, dyspnea, palpitation, gross hematuria, decreased urine output, focal weakness, or edema.   She states that about 2 months ago she had a BP check at her podiatrist's office and it was mildly elevated at 142/92, which she attributes to some stress.   Lab Results  Component Value Date   CREATININE 0.74 10/30/2017   BUN 16 10/30/2017   NA 141 10/30/2017   K 4.0 10/30/2017   CL 102 10/30/2017   CO2 34 (H) 10/30/2017   Allergy rhinitis: Currently she is on Flonase nasal spray, which she uses as needed and helps with nasal congestion and rhinorrhea. She has not had fever, chills, or sinus pain. Negative for cough and wheezing.  Hyperlipidemia: Currently she is on nonpharmacologic treatment. She has not been consistent with following a healthful diet. She is walking about 2 miles every 2 days.  Lab Results  Component Value Date   CHOL 203 (H) 10/30/2017   HDL 74.20 10/30/2017   LDLCALC 111 (H) 10/30/2017   TRIG 89.0 10/30/2017   CHOLHDL 3 10/30/2017   A1C has been  elevated in the past.  Lab Results  Component Value Date   HGBA1C 5.7 10/30/2017  Denies abdominal pain, nausea,vomiting, polydipsia,polyuria, or polyphagia.   ROS: See pertinent positives and negatives per HPI.  Past Medical History:  Diagnosis Date  . Allergy   . Arthritis   . GERD (gastroesophageal reflux disease)   . Hypertension     Past Surgical History:  Procedure Laterality Date  . BREAST SURGERY     biopsy  . ENDOVENOUS ABLATION SAPHENOUS VEIN W/ LASER Right 12/12/2018   endovenous laser ablation right greater saphenous vein by Ruta Hinds MD    Family History  Problem Relation Age of Onset  . Hypertension Mother   . Dementia Mother   . Stroke Father   . Hypertension Father   . Cancer Maternal Grandmother        gyn ("vaginal")  . Diabetes Maternal Grandfather     Social History   Socioeconomic History  . Marital status: Married    Spouse name: Not on file  . Number of children: Not on file  . Years of education: Not on file  . Highest education level: Not on file  Occupational History  . Not on file  Social Needs  . Financial resource strain: Not on file  . Food insecurity    Worry: Not on file    Inability: Not on file  . Transportation needs    Medical: Not on file    Non-medical: Not on file  Tobacco Use  . Smoking status: Never Smoker  . Smokeless tobacco: Never Used  Substance  and Sexual Activity  . Alcohol use: No  . Drug use: No  . Sexual activity: Not Currently  Lifestyle  . Physical activity    Days per week: Not on file    Minutes per session: Not on file  . Stress: Not on file  Relationships  . Social Musician on phone: Not on file    Gets together: Not on file    Attends religious service: Not on file    Active member of club or organization: Not on file    Attends meetings of clubs or organizations: Not on file    Relationship status: Not on file  . Intimate partner violence    Fear of current or ex  partner: Not on file    Emotionally abused: Not on file    Physically abused: Not on file    Forced sexual activity: Not on file  Other Topics Concern  . Not on file  Social History Narrative  . Not on file    Current Outpatient Medications:  .  fluticasone (FLONASE) 50 MCG/ACT nasal spray, Use 1 spray(s) in each nostril twice daily, Disp: 16 g, Rfl: 3 .  triamterene-hydrochlorothiazide (MAXZIDE-25) 37.5-25 MG tablet, Take 1 tablet by mouth daily., Disp: 90 tablet, Rfl: 1  EXAM:  VITALS per patient if applicable:BP (!) 170/85   Pulse 81   Ht 5' 3.5" (1.613 m)   BMI 31.04 kg/m   GENERAL: alert, oriented, appears well and in no acute distress  HEENT: atraumatic, conjunctiva clear, no obvious abnormalities on inspection.  NECK: normal movements of the head and neck  LUNGS: on inspection no signs of respiratory distress, breathing rate appears normal, no obvious gross SOB, gasping or wheezing  CV: no obvious cyanosis  Robin: moves all visible extremities without noticeable abnormality  PSYCH/NEURO: pleasant and cooperative, no obvious depression or anxiety, speech and thought processing grossly intact  ASSESSMENT AND PLAN:  Discussed the following assessment and plan:  Hypertension, essential, benign - Plan: Basic metabolic panel, triamterene-hydrochlorothiazide (MAXZIDE-25) 37.5-25 MG tablet BP readings elevated today, she does not think her BP monitor is accurate. She is going to ask her husband to check BP and she will let me know about readings. We discussed possible complications of elevated BP. Continue low-salt diet. No changes in current management. Instructed about warning signs.  Allergic rhinitis, unspecified seasonality, unspecified trigger - Plan: fluticasone (FLONASE) 50 MCG/ACT nasal spray Problem is well controlled with Flonase nasal spray daily as needed. She can also try nasal saline irrigations as needed.  Hyperlipidemia, unspecified hyperlipidemia type  - Plan: Lipid panel For now recommend low-fat diet. Further recommendation will be given according to lipid panel results.  Prediabetes - Plan: Basic metabolic panel, Hemoglobin A1c Healthy lifestyle for primary prevention recommended.  Lab appointment will be arranged.   I discussed the assessment and treatment plan with the patient. She was provided an opportunity to ask questions and all were answered. She agreed with the plan and demonstrated an understanding of the instructions.   The patient was advised to call back or seek an in-person evaluation if the symptoms worsen or if the condition fails to improve as anticipated.   We will decide about follow-up depending on BP readings at home.   [place], MD

## 2019-06-13 ENCOUNTER — Other Ambulatory Visit: Payer: Self-pay

## 2019-06-13 ENCOUNTER — Other Ambulatory Visit (INDEPENDENT_AMBULATORY_CARE_PROVIDER_SITE_OTHER): Payer: Medicare Other

## 2019-06-13 DIAGNOSIS — I10 Essential (primary) hypertension: Secondary | ICD-10-CM | POA: Diagnosis not present

## 2019-06-13 DIAGNOSIS — E785 Hyperlipidemia, unspecified: Secondary | ICD-10-CM | POA: Diagnosis not present

## 2019-06-13 DIAGNOSIS — R7303 Prediabetes: Secondary | ICD-10-CM | POA: Diagnosis not present

## 2019-06-13 LAB — BASIC METABOLIC PANEL
BUN: 20 mg/dL (ref 6–23)
CO2: 31 mEq/L (ref 19–32)
Calcium: 9.2 mg/dL (ref 8.4–10.5)
Chloride: 103 mEq/L (ref 96–112)
Creatinine, Ser: 0.86 mg/dL (ref 0.40–1.20)
GFR: 79.82 mL/min (ref >60.00)
Glucose, Bld: 81 mg/dL (ref 70–99)
Potassium: 3.8 mEq/L (ref 3.5–5.1)
Sodium: 142 mEq/L (ref 135–145)

## 2019-06-13 LAB — LIPID PANEL
Cholesterol: 210 mg/dL — ABNORMAL HIGH (ref 0–200)
HDL: 73.4 mg/dL (ref >39.00)
LDL Cholesterol: 120 mg/dL — ABNORMAL HIGH (ref 0–99)
NonHDL: 136.2
Total CHOL/HDL Ratio: 3
Triglycerides: 79 mg/dL (ref 0.0–149.0)
VLDL: 15.8 mg/dL (ref 0.0–40.0)

## 2019-06-13 LAB — HEMOGLOBIN A1C: Hgb A1c MFr Bld: 5.9 % (ref 4.6–6.5)

## 2019-07-10 ENCOUNTER — Encounter: Payer: Medicare Other | Admitting: Vascular Surgery

## 2019-08-05 ENCOUNTER — Telehealth: Payer: Self-pay | Admitting: Family Medicine

## 2019-08-05 NOTE — Telephone Encounter (Signed)
Patient wanted to know if she could stop by tomorrow to drop off form for authorization to release information from [employer] [facility]   Please advise  Call back number [phone]

## 2019-08-12 NOTE — Telephone Encounter (Signed)
Patient can drop of the form at her convenience.

## 2019-08-12 NOTE — Telephone Encounter (Signed)
Left message for patient to call back. CRM created 

## 2019-08-14 ENCOUNTER — Other Ambulatory Visit: Payer: Self-pay

## 2019-08-14 ENCOUNTER — Ambulatory Visit: Payer: Medicare Other | Admitting: Podiatry

## 2019-08-14 ENCOUNTER — Encounter: Payer: Self-pay | Admitting: Podiatry

## 2019-08-14 DIAGNOSIS — M7752 Other enthesopathy of left foot: Secondary | ICD-10-CM | POA: Diagnosis not present

## 2019-08-14 DIAGNOSIS — Q828 Other specified congenital malformations of skin: Secondary | ICD-10-CM | POA: Diagnosis not present

## 2019-08-14 DIAGNOSIS — M779 Enthesopathy, unspecified: Secondary | ICD-10-CM

## 2019-08-15 NOTE — Progress Notes (Signed)
Subjective:   Patient ID: Robin Martinez, female   DOB: 68 y.o.   MRN: [id number]   HPI Patient presents stating he has developed a lot of pain in the outside of the left foot with inflammation and has lesions present both feet   ROS      Objective:  Physical Exam  Inflammatory capsulitis around the base of the fifth MPJ left with fluid buildup along with keratotic lesion bilateral that are painful     Assessment:  H&P reviewed condition and recommended the continuation of conservative treatment.  I went ahead and did a careful sterile prep and then injection of the base of the fifth metatarsal around the plantar capsule 3 mg Kenalog 5 mg Xylocaine and debrided lesions on both feet with no iatrogenic bleeding noted.  Patient will be seen back to recheck     Plan:  Reviewed above with chronic lesion in the plantar capsule fifth MPJ that is inflamed

## 2019-09-11 ENCOUNTER — Ambulatory Visit: Payer: Medicare Other | Admitting: Vascular Surgery

## 2019-09-11 ENCOUNTER — Encounter: Payer: Self-pay | Admitting: Vascular Surgery

## 2019-09-11 ENCOUNTER — Other Ambulatory Visit: Payer: Self-pay

## 2019-09-11 VITALS — BP 136/88 | HR 62 | Temp 97.2°F | Resp 16 | Ht 63.0 in | Wt 180.0 lb

## 2019-09-11 DIAGNOSIS — I83811 Varicose veins of right lower extremities with pain: Secondary | ICD-10-CM | POA: Diagnosis not present

## 2019-09-11 HISTORY — PX: OTHER SURGICAL HISTORY: SHX169

## 2019-09-11 NOTE — Progress Notes (Signed)
    Stab Phlebectomy Procedure  Robin Martinez DOB:1951/06/19  09/11/2019  Consent signed: Yes  Surgeon:C.E. Tru Rana, MD  Procedure: stab phlebectomy: right leg  BP 136/88 (BP Location: Left Arm, Patient Position: Sitting, Cuff Size: Normal)   Pulse 62   Temp (!) 97.2 F (36.2 C) (Temporal)   Resp 16   Ht 5\' 3"  (1.6 m)   Wt 180 lb (81.6 kg)   SpO2 100%   BMI 31.89 kg/m   Start time: 11:17 AM   End time: 12:15 PM    Tumescent Anesthesia: 425 cc 0.9% NaCl with 50 cc Lidocaine HCL 1%  and 15 cc 8.4% NaHCO3  Local Anesthesia: 14 cc Lidocaine HCL and NaHCO3 (ratio 2:1)    Stab Phlebectomy: > 20  Sites: Thigh, Calf and Ankle  Patient tolerated procedure well: Yes  Notes: [name] wore facial masks. All staff members wore facial masks and goggles.    Description of Procedure:  After marking the course of the secondary varicosities, the patient was placed on the operating table in the supine position, and the right leg was prepped and draped in sterile fashion.    The patient was then put into Trendelenburg position.  Local anesthetic was administered at the previously marked varicosities, and tumescent anesthesia was administered around the vessels.  Greater than 20 stab wounds were made using the tip of an 11 blade. And using the vein hook, the phlebectomies were performed using a hemostat to avulse the varicosities.  Adequate hemostasis was achieved, and steri strips were applied to the stab wound.      ABD pads and thigh high compression stockings were applied as well ace wraps where needed. Blood loss was less than 15 cc.  Post procedure instructions reviewed with patient and hardcopy given to patient to take home. The patient ambulated out of the operating room having tolerated the procedure well.  Ruta Hinds, MD [facility] [place] Office: [phone]

## 2019-09-19 ENCOUNTER — Encounter: Payer: Self-pay | Admitting: Vascular Surgery

## 2019-10-09 ENCOUNTER — Encounter: Payer: Self-pay | Admitting: Vascular Surgery

## 2019-10-22 ENCOUNTER — Other Ambulatory Visit: Payer: Self-pay | Admitting: Family Medicine

## 2019-10-22 DIAGNOSIS — Z1231 Encounter for screening mammogram for malignant neoplasm of breast: Secondary | ICD-10-CM

## 2019-11-12 ENCOUNTER — Telehealth: Payer: Self-pay | Admitting: Family Medicine

## 2019-11-12 DIAGNOSIS — Z13820 Encounter for screening for osteoporosis: Secondary | ICD-10-CM

## 2019-11-12 NOTE — Telephone Encounter (Signed)
I called and spoke with pt. She wanted to know if it was okay to get her [facility] since she got it done at [facility] last time. Informed pt it is okay to have [facility]. Dexa scan order entered with message to see if they can schedule it on the same day as her mammogram.

## 2019-11-12 NOTE — Telephone Encounter (Signed)
Pt received a letter in the mail about having a Bone density test. Pt would like to know if Dr. [place] ordered the test or a mammogram. Pt can be reached at [phone]

## 2019-11-13 ENCOUNTER — Telehealth: Payer: Self-pay | Admitting: Family Medicine

## 2019-11-13 DIAGNOSIS — Z1231 Encounter for screening mammogram for malignant neoplasm of breast: Secondary | ICD-10-CM

## 2019-11-13 DIAGNOSIS — Z13820 Encounter for screening for osteoporosis: Secondary | ICD-10-CM

## 2019-11-13 NOTE — Addendum Note (Signed)
Addended by: Kathreen Devoid on: 11/13/2019 03:17 PM   Modules accepted: Orders

## 2019-11-13 NOTE — Telephone Encounter (Signed)
Orders placed & printed for pcp signature.

## 2019-11-13 NOTE — Telephone Encounter (Signed)
Pt is wanting a order to have a bone density and mammogram and want both to be done at [facility] on [address].

## 2019-11-15 NOTE — Telephone Encounter (Signed)
Orders signed & faxed to [facility]. They will contact pt to schedule appointments.

## 2019-11-20 ENCOUNTER — Ambulatory Visit (INDEPENDENT_AMBULATORY_CARE_PROVIDER_SITE_OTHER): Payer: Medicare Other | Admitting: Podiatry

## 2019-11-20 ENCOUNTER — Other Ambulatory Visit: Payer: Self-pay

## 2019-11-20 VITALS — Temp 96.6°F

## 2019-11-20 DIAGNOSIS — M21622 Bunionette of left foot: Secondary | ICD-10-CM | POA: Diagnosis not present

## 2019-11-20 DIAGNOSIS — Q828 Other specified congenital malformations of skin: Secondary | ICD-10-CM

## 2019-11-20 NOTE — Progress Notes (Signed)
Subjective:   Patient ID: Robin Martinez, female   DOB: 69 y.o.   MRN: [id number]   HPI Patient presents stating these lesions are bad especially as one of my left foot and it only lasted for a short period of time   ROS      Objective:  Physical Exam  Ocular status intact with severe keratotic lesion subfifth metatarsal head left that is painful with several of the lesions on both feet     Assessment:  Tailor's bunion deformity left with inflammatory capsulitis fifth MPJ with lesions also on both feet     Plan:  H&P reviewed all conditions and today went ahead did sterile debridement of all lesions and discussed possible fifth metatarsal head resection left educating her on the procedure that would be necessary.  Patient will be seen back when symptomatic and depending on when it comes back consideration for surgery will be pressed

## 2019-11-22 ENCOUNTER — Ambulatory Visit: Payer: Medicare Other

## 2019-12-10 ENCOUNTER — Encounter: Payer: Self-pay | Admitting: Family Medicine

## 2019-12-10 DIAGNOSIS — Z1231 Encounter for screening mammogram for malignant neoplasm of breast: Secondary | ICD-10-CM | POA: Diagnosis not present

## 2019-12-10 DIAGNOSIS — M85852 Other specified disorders of bone density and structure, left thigh: Secondary | ICD-10-CM | POA: Diagnosis not present

## 2019-12-10 LAB — HM MAMMOGRAPHY

## 2019-12-11 ENCOUNTER — Encounter: Payer: Self-pay | Admitting: Family Medicine

## 2019-12-17 ENCOUNTER — Telehealth: Payer: Self-pay | Admitting: Family Medicine

## 2019-12-17 DIAGNOSIS — I10 Essential (primary) hypertension: Secondary | ICD-10-CM

## 2019-12-17 DIAGNOSIS — J309 Allergic rhinitis, unspecified: Secondary | ICD-10-CM

## 2019-12-17 MED ORDER — FLUTICASONE PROPIONATE 50 MCG/ACT NA SUSP: NASAL | 3 refills | Status: DC

## 2019-12-17 MED ORDER — TRIAMTERENE-HCTZ 37.5-25 MG PO TABS: 1.0000 | ORAL_TABLET | Freq: Every day | ORAL | 1 refills | Status: DC

## 2019-12-17 NOTE — Telephone Encounter (Signed)
triamterene-hydrochlorothiazide (MAXZIDE-25) 37.5-25 MG tablet   fluticasone (FLONASE) 50 MCG/ACT nasal spray    [id number] - [place] ([place]), [place] - [address] Phone:  [phone]  Fax:  [phone]

## 2019-12-17 NOTE — Telephone Encounter (Signed)
Rx's sent in as requested. 

## 2020-01-05 ENCOUNTER — Encounter: Payer: Self-pay | Admitting: Family Medicine

## 2020-01-06 ENCOUNTER — Other Ambulatory Visit: Payer: Self-pay | Admitting: Family Medicine

## 2020-01-06 DIAGNOSIS — H538 Other visual disturbances: Secondary | ICD-10-CM

## 2020-01-06 NOTE — Telephone Encounter (Signed)
Pt came in to see if she needs a referral to go to the ophthalmologist.  She was told that the Mychart msg was rec'd and sent to the provider.  And Dr. [place]il[place] will let her know what she will do after reading the message.  Pt is aware to call her insurance company to see if she will need a referral but per [facility] she should not need a referral to see the ophthalmologist.  Pt also will call the ophthalmologist office tomorrow (due to them being closed today) to see if they require a referral from her PCP.

## 2020-01-22 DIAGNOSIS — H10412 Chronic giant papillary conjunctivitis, left eye: Secondary | ICD-10-CM | POA: Diagnosis not present

## 2020-01-22 DIAGNOSIS — H4322 Crystalline deposits in vitreous body, left eye: Secondary | ICD-10-CM | POA: Diagnosis not present

## 2020-01-22 DIAGNOSIS — H5203 Hypermetropia, bilateral: Secondary | ICD-10-CM | POA: Diagnosis not present

## 2020-02-21 DIAGNOSIS — S93432A Sprain of tibiofibular ligament of left ankle, initial encounter: Secondary | ICD-10-CM | POA: Diagnosis not present

## 2020-02-21 DIAGNOSIS — M25572 Pain in left ankle and joints of left foot: Secondary | ICD-10-CM | POA: Diagnosis not present

## 2020-04-10 ENCOUNTER — Other Ambulatory Visit: Payer: Self-pay

## 2020-04-10 ENCOUNTER — Ambulatory Visit (INDEPENDENT_AMBULATORY_CARE_PROVIDER_SITE_OTHER): Payer: Medicare Other

## 2020-04-10 ENCOUNTER — Encounter: Payer: Self-pay | Admitting: Podiatry

## 2020-04-10 ENCOUNTER — Ambulatory Visit (INDEPENDENT_AMBULATORY_CARE_PROVIDER_SITE_OTHER): Payer: Medicare Other | Admitting: Podiatry

## 2020-04-10 DIAGNOSIS — M779 Enthesopathy, unspecified: Secondary | ICD-10-CM

## 2020-04-10 DIAGNOSIS — M79671 Pain in right foot: Secondary | ICD-10-CM

## 2020-04-10 DIAGNOSIS — M79672 Pain in left foot: Secondary | ICD-10-CM | POA: Diagnosis not present

## 2020-04-10 DIAGNOSIS — Q828 Other specified congenital malformations of skin: Secondary | ICD-10-CM

## 2020-04-14 NOTE — Progress Notes (Signed)
Subjective:   Patient ID: Robin Martinez, female   DOB: 69 y.o.   MRN: [id number]   HPI Patient states she has had chronic pain in the plantar aspect of both feet which makes walking difficult   ROS      Objective:  Physical Exam  Neurovascular status intact with inflammation around the fifth MPJ left with fluid buildup and keratotic lesions plantar aspect first fifth metatarsals bilateral     Assessment:  Inflammatory capsulitis with lesion formation     Plan:  H&P reviewed condition and went ahead and did sterile prep and injected around the MPJ 3 mg Dexasone Kenalog 5 mg Xylocaine and debrided lesions bilateral with no iatrogenic bleeding.  Reappoint to recheck

## 2020-05-29 ENCOUNTER — Other Ambulatory Visit: Payer: Self-pay

## 2020-05-29 ENCOUNTER — Ambulatory Visit (INDEPENDENT_AMBULATORY_CARE_PROVIDER_SITE_OTHER): Payer: Medicare Other | Admitting: Family Medicine

## 2020-05-29 ENCOUNTER — Encounter: Payer: Self-pay | Admitting: Family Medicine

## 2020-05-29 VITALS — BP 136/78 | HR 68 | Temp 98.3°F | Wt 182.0 lb

## 2020-05-29 DIAGNOSIS — S43102A Unspecified dislocation of left acromioclavicular joint, initial encounter: Secondary | ICD-10-CM

## 2020-05-29 DIAGNOSIS — G8929 Other chronic pain: Secondary | ICD-10-CM | POA: Diagnosis not present

## 2020-05-29 DIAGNOSIS — M898X1 Other specified disorders of bone, shoulder: Secondary | ICD-10-CM | POA: Diagnosis not present

## 2020-05-29 DIAGNOSIS — M25512 Pain in left shoulder: Secondary | ICD-10-CM

## 2020-05-29 NOTE — Progress Notes (Addendum)
Subjective:    Patient ID: Robin Martinez, female    DOB: [birth year], 69 y.o.   MRN: [id number]  No chief complaint on file.   HPI Patient, followed by Dr. [place], was seen today for ongoing concern.  Pt endorse L collar bone pain since June.  Pt unsure of injury, but noticed edema of L clavicle after going go-carting while on vacation.  Pt also with L sided neck and posterior L shoulder soreness.  Pt states she has not been using the arm as she did not know what was wrong with it.  Pt's L shoulder pain has been occurring x several months as she 1st noticed it after sleeping on her L side.  Pt took tylenol and ASA one for the sensation.  Past Medical History:  Diagnosis Date  . Allergy   . Arthritis   . GERD (gastroesophageal reflux disease)   . Hypertension     No Known Allergies  ROS General: Denies fever, chills, night sweats, changes in weight, changes in appetite HEENT: Denies headaches, ear pain, changes in vision, rhinorrhea, sore throat CV: Denies CP, palpitations, SOB, orthopnea Pulm: Denies SOB, cough, wheezing GI: Denies abdominal pain, nausea, vomiting, diarrhea, constipation GU: Denies dysuria, hematuria, frequency, vaginal discharge Msk: Denies muscle cramps, joint pains  +L shoulder and neck pain.  Edema of L clavicle. Neuro: Denies weakness, numbness, tingling Skin: Denies rashes, bruising Psych: Denies depression, anxiety, hallucinations      Objective:    Blood pressure 136/78, pulse 68, temperature 98.3 F (36.8 C), temperature source Oral, weight 182 lb (82.6 kg), SpO2 97 %.  Gen. Pleasant, well-nourished, in no distress, normal affect   HEENT: [place]/AT, face symmetric, conjunctiva clear, no scleral icterus, PERRLA, EOMI, nares patent without drainage.  No cervical lymphadenopathy Lungs: no accessory muscle use Cardiovascular: RRR, no peripheral edema Musculoskeletal: L clavicle more prominent proximal at sternum than R clavicle.  TTP of L clavicle, L AC joint,  and lateral scapula.  +Hawkins and Neers.  Negative empty can.  Normal ROM with pain.  No cyanosis or clubbing, normal tone Neuro:  A&Ox3, CN II-XII intact, normal gait Skin:  Warm, no lesions/ rash   Wt Readings from Last 3 Encounters:  09/11/19 180 lb (81.6 kg)  04/17/19 178 lb (80.7 kg)  12/20/18 178 lb (80.7 kg)    Lab Results  Component Value Date   GLUCOSE 81 06/13/2019   CHOL 210 (H) 06/13/2019   TRIG 79.0 06/13/2019   HDL 73.40 06/13/2019   LDLCALC 120 (H) 06/13/2019   ALT 20 01/08/2013   AST 21 01/08/2013   NA 142 06/13/2019   K 3.8 06/13/2019   CL 103 06/13/2019   CREATININE 0.86 06/13/2019   BUN 20 06/13/2019   CO2 31 06/13/2019   TSH 1.77 01/08/2013   HGBA1C 5.9 06/13/2019   MICROALBUR 1.2 01/08/2013    Assessment/Plan:  Chronic left shoulder pain  -discussed likely multifactorial: lack of use, dislocation on L clavicle, and AC joint arthritis. -discussed supportive care: heat, OTC topical analgesics, NSAIDs po prn, ROM exercises -pt advised to move/use arm -given handout - Plan: DG Shoulder Left  Pain of left clavicle -L clavicle protruding at sternoclavicular joint. -given duration of symptoms continue supportive care.  Advised will likely remain prominent. - Plan: DG Clavicle Left  F/u prn with pcp  Abbe Amsterdam, MD

## 2020-05-29 NOTE — Patient Instructions (Addendum)
Shoulder Pain Many things can cause shoulder pain, including:  An injury to the shoulder.  Overuse of the shoulder.  Arthritis. The source of the pain can be:  Inflammation.  An injury to the shoulder joint.  An injury to a tendon, ligament, or bone. Follow these instructions at home: Pay attention to changes in your symptoms. Let your health care provider know about them. Follow these instructions to relieve your pain. If you have a sling:  Wear the sling as told by your health care provider. Remove it only as told by your health care provider.  Loosen the sling if your fingers tingle, become numb, or turn cold and blue.  Keep the sling clean.  If the sling is not waterproof: ? Do not let it get wet. Remove it to shower or bathe.  Move your arm as little as possible, but keep your hand moving to prevent swelling. Managing pain, stiffness, and swelling   If directed, put ice on the painful area: ? Put ice in a plastic bag. ? Place a towel between your skin and the bag. ? Leave the ice on for 20 minutes, 2-3 times per day. Stop applying ice if it does not help with the pain.  Squeeze a soft ball or a foam pad as much as possible. This helps to keep the shoulder from swelling. It also helps to strengthen the arm. General instructions  Take over-the-counter and prescription medicines only as told by your health care provider.  Keep all follow-up visits as told by your health care provider. This is important. Contact a health care provider if:  Your pain gets worse.  Your pain is not relieved with medicines.  New pain develops in your arm, hand, or fingers. Get help right away if:  Your arm, hand, or fingers: ? Tingle. ? Become numb. ? Become swollen. ? Become painful. ? Turn white or blue. Summary  Shoulder pain can be caused by an injury, overuse, or arthritis.  Pay attention to changes in your symptoms. Let your health care provider know about  them.  This condition may be treated with a sling, ice, and pain medicines.  Contact your health care provider if the pain gets worse or new pain develops. Get help right away if your arm, hand, or fingers tingle or become numb, swollen, or painful.  Keep all follow-up visits as told by your health care provider. This is important. This information is not intended to replace advice given to you by your health care provider. Make sure you discuss any questions you have with your health care provider. Document Revised: 04/10/2018 Document Reviewed: 04/10/2018 Elsevier Patient Education  2020 [employer] Inc[employer]  

## 2020-06-02 ENCOUNTER — Encounter: Payer: Self-pay | Admitting: Family Medicine

## 2020-06-02 ENCOUNTER — Ambulatory Visit (INDEPENDENT_AMBULATORY_CARE_PROVIDER_SITE_OTHER): Payer: Medicare Other | Admitting: Family Medicine

## 2020-06-02 ENCOUNTER — Other Ambulatory Visit: Payer: Self-pay

## 2020-06-02 ENCOUNTER — Ambulatory Visit (INDEPENDENT_AMBULATORY_CARE_PROVIDER_SITE_OTHER)
Admission: RE | Admit: 2020-06-02 | Discharge: 2020-06-02 | Disposition: A | Discharge status: Home / Self Care | Payer: Medicare Other | Source: Ambulatory Visit | Attending: Family Medicine | Admitting: Family Medicine

## 2020-06-02 VITALS — BP 136/70 | HR 72 | Temp 98.0°F | Resp 16 | Ht 63.0 in | Wt 180.0 lb

## 2020-06-02 DIAGNOSIS — I10 Essential (primary) hypertension: Secondary | ICD-10-CM | POA: Diagnosis not present

## 2020-06-02 DIAGNOSIS — R7303 Prediabetes: Secondary | ICD-10-CM | POA: Diagnosis not present

## 2020-06-02 DIAGNOSIS — M25512 Pain in left shoulder: Secondary | ICD-10-CM | POA: Diagnosis not present

## 2020-06-02 DIAGNOSIS — S43102A Unspecified dislocation of left acromioclavicular joint, initial encounter: Secondary | ICD-10-CM

## 2020-06-02 DIAGNOSIS — E785 Hyperlipidemia, unspecified: Secondary | ICD-10-CM

## 2020-06-02 DIAGNOSIS — G8929 Other chronic pain: Secondary | ICD-10-CM

## 2020-06-02 DIAGNOSIS — Z0000 Encounter for general adult medical examination without abnormal findings: Secondary | ICD-10-CM | POA: Diagnosis not present

## 2020-06-02 DIAGNOSIS — M12812 Other specific arthropathies, not elsewhere classified, left shoulder: Secondary | ICD-10-CM | POA: Diagnosis not present

## 2020-06-02 NOTE — Progress Notes (Signed)
HPI: Robin Martinez is a 69 y.o. female, who is here today for her routine physical.  Last CPE: 10/30/17.  Regular exercise 3 or more time per week: She goes to the gyn 2/week and has a treadmill at home. Following a healthy diet: Cooking her meals, baking more during COVID 19, which contributed to wt gain. She lives with her husband.  Chronic medical problems: Varicose veins,HTN,HLD,OA,and allergic rhinitis. HgA1C was mildly elevated 5.9. Denies abdominal pain, nausea,vomiting, polydipsia,polyuria, or polyphagia.  Immunization History  Administered Date(s) Administered  . Janssen (J&J) SARS-COV-2 Vaccination 02/19/2020   Mammogram: 12/10/2019 Bi-Rads 1 Colonoscopy: 10/12/13, 10 years follow up recommended. DEXA: 12/10/19 osteopenia.  Hep C screening: 10/30/17 NR  She has no new concerns today.  HTN: She is on Triamterene-HCTZ 37.5-25 mg daily.  Lab Results  Component Value Date   CREATININE 0.86 06/13/2019   BUN 20 06/13/2019   NA 142 06/13/2019   K 3.8 06/13/2019   CL 103 06/13/2019   CO2 31 06/13/2019   HLD: She is on non pharmacologic treatment.  Lab Results  Component Value Date   CHOL 210 (H) 06/13/2019   HDL 73.40 06/13/2019   LDLCALC 120 (H) 06/13/2019   TRIG 79.0 06/13/2019   CHOLHDL 3 06/13/2019   Review of Systems  Constitutional: Negative for appetite change, fatigue and fever.  HENT: Negative for hearing loss, mouth sores and sore throat.   Eyes: Negative for redness and visual disturbance.  Respiratory: Negative for cough, shortness of breath and wheezing.   Cardiovascular: Negative for chest pain and leg swelling.  Gastrointestinal: Negative for abdominal pain, nausea and vomiting.       No changes in bowel habits.  Endocrine: Negative for cold intolerance and heat intolerance.  Genitourinary: Negative for decreased urine volume, dysuria, hematuria, vaginal bleeding and vaginal discharge.  Musculoskeletal: Positive for arthralgias. Negative for  gait problem.  Skin: Negative for color change and rash.  Allergic/Immunologic: Positive for environmental allergies.  Neurological: Negative for syncope, weakness and headaches.  Hematological: Negative for adenopathy. Does not bruise/bleed easily.  Psychiatric/Behavioral: Negative for confusion and sleep disturbance. The patient is not nervous/anxious.   All other systems reviewed and are negative.  Current Outpatient Medications on File Prior to Visit  Medication Sig Dispense Refill  . fluticasone (FLONASE) 50 MCG/ACT nasal spray Use 1 spray(s) in each nostril twice daily 16 g 3  . triamterene-hydrochlorothiazide (MAXZIDE-25) 37.5-25 MG tablet Take 1 tablet by mouth daily. 90 tablet 1   No current facility-administered medications on file prior to visit.    Past Medical History:  Diagnosis Date  . Allergy   . Arthritis   . GERD (gastroesophageal reflux disease)   . Hypertension     Past Surgical History:  Procedure Laterality Date  . BREAST SURGERY     biopsy  . ENDOVENOUS ABLATION SAPHENOUS VEIN W/ LASER Right 12/12/2018   endovenous laser ablation right greater saphenous vein by Ruta Hinds MD  . stab phlebectomy  Right 09/11/2019   stab phlebectomy > 20 incisions right leg by Ruta Hinds MD     No Known Allergies  Family History  Problem Relation Age of Onset  . Hypertension Mother   . Dementia Mother   . Stroke Father   . Hypertension Father   . Cancer Maternal Grandmother        gyn ("vaginal")  . Diabetes Maternal Grandfather     Social History   Socioeconomic History  . Marital status: Married  Spouse name: Not on file  . Number of children: Not on file  . Years of education: Not on file  . Highest education level: Not on file  Occupational History  . Not on file  Tobacco Use  . Smoking status: Never Smoker  . Smokeless tobacco: Never Used  Substance and Sexual Activity  . Alcohol use: No  . Drug use: No  . Sexual activity: Not  Currently  Other Topics Concern  . Not on file  Social History Narrative  . Not on file   Social Determinants of Health   Financial Resource Strain:   . Difficulty of Paying Living Expenses: Not on file  Food Insecurity:   . Worried About Charity fundraiser in the Last Year: Not on file  . Ran Out of Food in the Last Year: Not on file  Transportation Needs:   . Lack of Transportation (Medical): Not on file  . Lack of Transportation (Non-Medical): Not on file  Physical Activity:   . Days of Exercise per Week: Not on file  . Minutes of Exercise per Session: Not on file  Stress:   . Feeling of Stress : Not on file  Social Connections:   . Frequency of Communication with Friends and Family: Not on file  . Frequency of Social Gatherings with Friends and Family: Not on file  . Attends Religious Services: Not on file  . Active Member of Clubs or Organizations: Not on file  . Attends Archivist Meetings: Not on file  . Marital Status: Not on file    Vitals:   06/02/20 0950  BP: 136/70  Pulse: 72  Resp: 16  Temp: 98 F (36.7 C)  SpO2: 97%   Body mass index is 31.89 kg/m.  Wt Readings from Last 3 Encounters:  06/02/20 180 lb (81.6 kg)  05/29/20 182 lb (82.6 kg)  09/11/19 180 lb (81.6 kg)   Physical Exam Vitals and nursing note reviewed.  Constitutional:      General: She is not in acute distress.    Appearance: She is well-developed.  HENT:     Head: Normocephalic and atraumatic.     Right Ear: Hearing, tympanic membrane, ear canal and external ear normal.     Left Ear: Hearing, tympanic membrane, ear canal and external ear normal.     Mouth/Throat:     Mouth: Mucous membranes are moist.     Pharynx: Oropharynx is clear. Uvula midline.  Eyes:     Extraocular Movements: Extraocular movements intact.     Conjunctiva/sclera: Conjunctivae normal.     Pupils: Pupils are equal, round, and reactive to light.  Neck:     Thyroid: No thyromegaly.     Trachea:  No tracheal deviation.  Cardiovascular:     Rate and Rhythm: Normal rate and regular rhythm.     Pulses:          Dorsalis pedis pulses are 2+ on the right side and 2+ on the left side.     Heart sounds: No murmur heard.   Pulmonary:     Effort: Pulmonary effort is normal. No respiratory distress.     Breath sounds: Normal breath sounds.  Chest:     Comments: Left clavicle is more prominent.There is no tenderness,edema,or local erythema. Abdominal:     Palpations: Abdomen is soft. There is no hepatomegaly or mass.     Tenderness: There is no abdominal tenderness.  Genitourinary:    Comments: Breast: No masses,skin changes,or nipple  discharge bilateral. Musculoskeletal:     Comments: No signs of synovitis appreciated.  Lymphadenopathy:     Cervical: No cervical adenopathy.     Upper Body:     Right upper body: No supraclavicular or axillary adenopathy.     Left upper body: No supraclavicular or axillary adenopathy.  Skin:    General: Skin is warm.     Findings: No erythema or rash.  Neurological:     General: No focal deficit present.     Mental Status: She is alert and oriented to person, place, and time.     Cranial Nerves: No cranial nerve deficit.     Coordination: Coordination normal.     Gait: Gait normal.     Deep Tendon Reflexes:     Reflex Scores:      Bicep reflexes are 2+ on the right side and 2+ on the left side.      Patellar reflexes are 2+ on the right side and 2+ on the left side. Psychiatric:        Speech: Speech normal.     Comments: Well groomed, good eye contact.   ASSESSMENT AND PLAN:  Ms. Robin Martinez was here today annual physical examination.  Orders Placed This Encounter  Procedures  . Comprehensive metabolic panel  . Hemoglobin A1c  . Lipid panel   Lab Results  Component Value Date   CREATININE 0.90 06/02/2020   BUN 19 06/02/2020   NA 139 06/02/2020   K 3.7 06/02/2020   CL 104 06/02/2020   CO2 26 06/02/2020   Lab Results    Component Value Date   HGBA1C 5.4 06/02/2020   Lab Results  Component Value Date   CHOL 222 (H) 06/02/2020   HDL 74 06/02/2020   LDLCALC 130 (H) 06/02/2020   TRIG 83 06/02/2020   CHOLHDL 3.0 06/02/2020    Lab Results  Component Value Date   ALT 14 06/02/2020   AST 15 06/02/2020   ALKPHOS 99 01/08/2013   BILITOT 1.1 06/02/2020   Routine general medical examination at a health care facility We discussed the importance of regular physical activity and healthy diet for prevention of chronic illness and/or complications. Preventive guidelines reviewed. Vaccination: Not interested in pneumovax.  Ca++ and vit D supplementation recommended. Next CPE in a year.  The 10-year ASCVD risk score Mikey Bussing DC Brooke Bonito., et al., 2013) is: 12.4%   Values used to calculate the score:     Age: 70 years     Sex: Female     Is Non-Hispanic African American: Yes     Diabetic: No     Tobacco smoker: No     Systolic Blood Pressure: 765 mmHg     Is BP treated: Yes     HDL Cholesterol: 74 mg/dL     Total Cholesterol: 222 mg/dL  Prediabetes Healthy life style for primary prevention of diabetes. Further recommendations according to HgA1C.  Hypertension, essential, benign BP adequately controlled. No changes in current management. Some side effects of Triamterene-HCTZ discussed. Continue low salt diet and monitoring BP regularly.  Hyperlipidemia Continue non pharmacologic treatment. Further recommendations according to FLP results and 10 years CVD risk score.  Return in 6 months (on 12/03/2020) for HTN.   Kyley Solow G. [place], MD  [facility]. [place] office.   A few things to remember from today's visit:   Routine general medical examination at a health care facility  Hyperlipidemia, unspecified hyperlipidemia type - Plan: Comprehensive metabolic panel, Lipid panel  Hypertension,  essential, benign - Plan: Comprehensive metabolic panel  Prediabetes - Plan: Comprehensive  metabolic panel, Hemoglobin A1c  Mammogram in 12/2020 and colonoscopy in 2025. DEX in 2-3 years. Calcium [phone] mg daily and vit D 800 U daily. Continue regular physical activity and fall precautions.  If you need refills please call your pharmacy. Do not use My Chart to request refills or for acute issues that need immediate attention.   Please be sure medication list is accurate. If a new problem present, please set up appointment sooner than planned today. A few tips:  -As we age balance is not as good as it was, so there is a higher risks for falls. Please remove small rugs and furniture that is "in your way" and could increase the risk of falls. Stretching exercises may help with fall prevention: Yoga and Tai Chi are some examples. Low impact exercise is better, so you are not very achy the next day.  -Sun screen and avoidance of direct sun light recommended. Caution with dehydration, if working outdoors be sure to drink enough fluids.  - Some medications are not safe as we age, increases the risk of side effects and can potentially interact with other medication you are also taken;  including some of over the counter medications. Be sure to let me know when you start a new medication even if it is a dietary/vitamin supplement.   -Healthy diet low in red meet/animal fat and sugar + regular physical activity is recommended.

## 2020-06-02 NOTE — Assessment & Plan Note (Signed)
BP adequately controlled. No changes in current management. Some side effects of Triamterene-HCTZ discussed. Continue low salt diet and monitoring BP regularly.

## 2020-06-02 NOTE — Assessment & Plan Note (Signed)
Continue non pharmacologic treatment. °Further recommendations according to FLP results and 10 years CVD risk score. °

## 2020-06-02 NOTE — Patient Instructions (Signed)
A few things to remember from today's visit:   Routine general medical examination at a health care facility  Hyperlipidemia, unspecified hyperlipidemia type - Plan: Comprehensive metabolic panel, Lipid panel  Hypertension, essential, benign - Plan: Comprehensive metabolic panel  Prediabetes - Plan: Comprehensive metabolic panel, Hemoglobin A1c  Mammogram in 12/2020 and colonoscopy in 2025. DEX in 2-3 years. Calcium [phone] mg daily and vit D 800 U daily. Continue regular physical activity and fall precautions.  If you need refills please call your pharmacy. Do not use My Chart to request refills or for acute issues that need immediate attention.   Please be sure medication list is accurate. If a new problem present, please set up appointment sooner than planned today. A few tips:  -As we age balance is not as good as it was, so there is a higher risks for falls. Please remove small rugs and furniture that is "in your way" and could increase the risk of falls. Stretching exercises may help with fall prevention: Yoga and Tai Chi are some examples. Low impact exercise is better, so you are not very achy the next day.  -Sun screen and avoidance of direct sun light recommended. Caution with dehydration, if working outdoors be sure to drink enough fluids.  - Some medications are not safe as we age, increases the risk of side effects and can potentially interact with other medication you are also taken;  including some of over the counter medications. Be sure to let me know when you start a new medication even if it is a dietary/vitamin supplement.   -Healthy diet low in red meet/animal fat and sugar + regular physical activity is recommended.

## 2020-06-03 LAB — COMPREHENSIVE METABOLIC PANEL
AG Ratio: 1.4 (calc) (ref 1.0–2.5)
ALT: 14 U/L (ref 6–29)
AST: 15 U/L (ref 10–35)
Albumin: 4.2 g/dL (ref 3.6–5.1)
Alkaline phosphatase (APISO): 102 U/L (ref 37–153)
BUN: 19 mg/dL (ref 7–25)
CO2: 26 mmol/L (ref 20–32)
Calcium: 9.5 mg/dL (ref 8.6–10.4)
Chloride: 104 mmol/L (ref 98–110)
Creat: 0.9 mg/dL (ref 0.50–0.99)
Globulin: 2.9 g/dL (calc) (ref 1.9–3.7)
Glucose, Bld: 96 mg/dL (ref 65–99)
Potassium: 3.7 mmol/L (ref 3.5–5.3)
Sodium: 139 mmol/L (ref 135–146)
Total Bilirubin: 1.1 mg/dL (ref 0.2–1.2)
Total Protein: 7.1 g/dL (ref 6.1–8.1)

## 2020-06-03 LAB — LIPID PANEL
Cholesterol: 222 mg/dL — ABNORMAL HIGH (ref <200)
HDL: 74 mg/dL (ref >=50)
LDL Cholesterol (Calc): 130 mg/dL (calc) — ABNORMAL HIGH
Non-HDL Cholesterol (Calc): 148 mg/dL (calc) — ABNORMAL HIGH (ref <130)
Total CHOL/HDL Ratio: 3 (calc) (ref <5.0)
Triglycerides: 83 mg/dL (ref <150)

## 2020-06-03 LAB — HEMOGLOBIN A1C
Hgb A1c MFr Bld: 5.4 % of total Hgb (ref <5.7)
Mean Plasma Glucose: 108 (calc)
eAG (mmol/L): 6 (calc)

## 2020-07-06 ENCOUNTER — Other Ambulatory Visit: Payer: Self-pay | Admitting: Family Medicine

## 2020-07-06 DIAGNOSIS — I10 Essential (primary) hypertension: Secondary | ICD-10-CM

## 2020-08-13 ENCOUNTER — Ambulatory Visit: Payer: Medicare Other | Admitting: Podiatry

## 2020-08-13 ENCOUNTER — Other Ambulatory Visit: Payer: Self-pay

## 2020-08-13 ENCOUNTER — Encounter: Payer: Self-pay | Admitting: Podiatry

## 2020-08-13 DIAGNOSIS — Q828 Other specified congenital malformations of skin: Secondary | ICD-10-CM | POA: Diagnosis not present

## 2020-08-13 DIAGNOSIS — M779 Enthesopathy, unspecified: Secondary | ICD-10-CM | POA: Diagnosis not present

## 2020-08-13 NOTE — Progress Notes (Signed)
Subjective:   Patient ID: Robin Martinez, female   DOB: 69 y.o.   MRN: [id number]   HPI Patient presents with inflammation around the fifth MPJ left and proximal with keratotic lesion x3 and discomfort in the heels that occurs at different times   ROS      Objective:  Physical Exam  Inflammatory capsulitis of the fifth MPJ left with fluid buildup along with lesion x3     Assessment:  Chronic porokeratotic lesion with inflammatory capsulitis fifth MPJ     Plan:  Sterile prep injected the fifth MPJ left 3 mg dexamethasone 5 mg Xylocaine and debrided 3 separate lesions with no iatrogenic bleeding and reappoint as needed

## 2020-08-13 NOTE — Patient Instructions (Signed)
Plantar Fasciitis  Plantar fasciitis is a painful foot condition that affects the heel. It occurs when the band of tissue that connects the toes to the heel bone (plantar fascia) becomes irritated. This can happen as the result of exercising too much or doing other repetitive activities (overuse injury). The pain from plantar fasciitis can range from mild irritation to severe pain that makes it difficult to walk or move. The pain is usually worse in the morning after sleeping, or after sitting or lying down for a while. Pain may also be worse after long periods of walking or standing. What are the causes? This condition may be caused by:  Standing for long periods of time.  Wearing shoes that do not have good arch support.  Doing activities that put stress on joints (high-impact activities), including running, aerobics, and ballet.  Being overweight.  An abnormal way of walking (gait).  Tight muscles in the back of your lower leg (calf).  High arches in your feet.  Starting a new athletic activity. What are the signs or symptoms? The main symptom of this condition is heel pain. Pain may:  Be worse with first steps after a time of rest, especially in the morning after sleeping or after you have been sitting or lying down for a while.  Be worse after long periods of standing still.  Decrease after 30-45 minutes of activity, such as gentle walking. How is this diagnosed? This condition may be diagnosed based on your medical history and your symptoms. Your health care provider may ask questions about your activity level. Your health care provider will do a physical exam to check for:  A tender area on the bottom of your foot.  A high arch in your foot.  Pain when you move your foot.  Difficulty moving your foot. You may have imaging tests to confirm the diagnosis, such as:  X-rays.  Ultrasound.  MRI. How is this treated? Treatment for plantar fasciitis depends on how  severe your condition is. Treatment may include:  Rest, ice, applying pressure (compression), and raising the affected foot (elevation). This may be called RICE therapy. Your health care provider may recommend RICE therapy along with over-the-counter pain medicines to manage your pain.  Exercises to stretch your calves and your plantar fascia.  A splint that holds your foot in a stretched, upward position while you sleep (night splint).  Physical therapy to relieve symptoms and prevent problems in the future.  Injections of steroid medicine (cortisone) to relieve pain and inflammation.  Stimulating your plantar fascia with electrical impulses (extracorporeal shock wave therapy). This is usually the last treatment option before surgery.  Surgery, if other treatments have not worked after 12 months. Follow these instructions at home:  Managing pain, stiffness, and swelling  If directed, put ice on the painful area: ? Put ice in a plastic bag, or use a frozen bottle of water. ? Place a towel between your skin and the bag or bottle. ? Roll the bottom of your foot over the bag or bottle. ? Do this for 20 minutes, 2-3 times a day.  Wear athletic shoes that have air-sole or gel-sole cushions, or try wearing soft shoe inserts that are designed for plantar fasciitis.  Raise (elevate) your foot above the level of your heart while you are sitting or lying down. Activity  Avoid activities that cause pain. Ask your health care provider what activities are safe for you.  Do physical therapy exercises and stretches as told   by your health care provider.  Try activities and forms of exercise that are easier on your joints (low-impact). Examples include swimming, water aerobics, and biking. General instructions  Take over-the-counter and prescription medicines only as told by your health care provider.  Wear a night splint while sleeping, if told by your health care provider. Loosen the splint  if your toes tingle, become numb, or turn cold and blue.  Maintain a healthy weight, or work with your health care provider to lose weight as needed.  Keep all follow-up visits as told by your health care provider. This is important. Contact a health care provider if you:  Have symptoms that do not go away after caring for yourself at home.  Have pain that gets worse.  Have pain that affects your ability to move or do your daily activities. Summary  Plantar fasciitis is a painful foot condition that affects the heel. It occurs when the band of tissue that connects the toes to the heel bone (plantar fascia) becomes irritated.  The main symptom of this condition is heel pain that may be worse after exercising too much or standing still for a long time.  Treatment varies, but it usually starts with rest, ice, compression, and elevation (RICE therapy) and over-the-counter medicines to manage pain. This information is not intended to replace advice given to you by your health care provider. Make sure you discuss any questions you have with your health care provider. Document Revised: 09/08/2017 Document Reviewed: 07/24/2017 Elsevier Patient Education  2020 Elsevier Inc[employer]  

## 2020-10-14 ENCOUNTER — Other Ambulatory Visit: Payer: Self-pay | Admitting: Family Medicine

## 2020-10-14 DIAGNOSIS — I10 Essential (primary) hypertension: Secondary | ICD-10-CM

## 2020-10-29 ENCOUNTER — Other Ambulatory Visit: Payer: Self-pay

## 2020-10-29 ENCOUNTER — Ambulatory Visit: Payer: Medicare Other | Admitting: Podiatry

## 2020-10-29 ENCOUNTER — Encounter: Payer: Self-pay | Admitting: Podiatry

## 2020-10-29 DIAGNOSIS — Q828 Other specified congenital malformations of skin: Secondary | ICD-10-CM

## 2020-10-29 DIAGNOSIS — M779 Enthesopathy, unspecified: Secondary | ICD-10-CM

## 2020-10-29 MED ORDER — TRIAMCINOLONE ACETONIDE 10 MG/ML IJ SUSP
10.0000 mg | Freq: Once | INTRAMUSCULAR | Status: AC
  Administered 2020-10-29: 10 mg

## 2020-10-29 NOTE — Progress Notes (Signed)
Subjective:   Patient ID: Robin Martinez, female   DOB: 70 y.o.   MRN: [id number]   HPI Patient states still having a lot of pain in the outside of the left foot and she is getting ready to go out of town for several months and needs it worked on with lesions x2   ROS      Objective:  Physical Exam  Neurovascular status intact with inflammation of the fifth MPJ left fluid buildup around the joint keratotic lesion fifth metatarsal both feet painful     Assessment:  Inflammatory capsulitis of the fifth MPJ left with porokeratotic lesions bilateral     Plan:  Sterile prep done injected the fifth MPJ left 3 mg dexamethasone Kenalog 5 mg Xylocaine and went ahead today and debrided lesions bilateral no iatrogenic bleeding no

## 2020-11-22 DIAGNOSIS — Z20822 Contact with and (suspected) exposure to covid-19: Secondary | ICD-10-CM | POA: Diagnosis not present

## 2020-11-24 ENCOUNTER — Ambulatory Visit: Payer: Medicare Other

## 2020-12-11 ENCOUNTER — Other Ambulatory Visit: Payer: Self-pay

## 2020-12-14 ENCOUNTER — Encounter: Payer: Self-pay | Admitting: Family Medicine

## 2020-12-14 ENCOUNTER — Other Ambulatory Visit: Payer: Self-pay

## 2020-12-14 ENCOUNTER — Ambulatory Visit (INDEPENDENT_AMBULATORY_CARE_PROVIDER_SITE_OTHER): Payer: Medicare Other | Admitting: Family Medicine

## 2020-12-14 VITALS — BP 120/70 | HR 63 | Temp 97.5°F | Resp 16 | Ht 63.0 in | Wt 181.5 lb

## 2020-12-14 DIAGNOSIS — S29011A Strain of muscle and tendon of front wall of thorax, initial encounter: Secondary | ICD-10-CM | POA: Diagnosis not present

## 2020-12-14 DIAGNOSIS — M19012 Primary osteoarthritis, left shoulder: Secondary | ICD-10-CM

## 2020-12-14 DIAGNOSIS — I10 Essential (primary) hypertension: Secondary | ICD-10-CM

## 2020-12-14 NOTE — Progress Notes (Signed)
Chief Complaint  Patient presents with  . pain under breast   HPI: [name] is a pleasant 70 y.o. female with hx of HTN and allergies here today with above complaint. A month ago she noted a "lumb" under right breast, she is not sure if lesion is still there. She has not noted skin changes ,breast tenderness,or nipple discharge.  She has been under a lot of stress, she wonders if this is playing a role. Her mother lives in [place], she is declining and expected to die at any time. She just came back a few days ago and may need to go back in a couple weeks to help with her care.  FHx negative for ovarian or breast cancer. Last mammogram 12/10/2019: Bi-Rads 1  She is surprised her BP reading today is good. She has felt sometimes like her BP is elevated. She is not checking BP regularly. Currently she is on triamterene-HCTZ 37.5-25 mg daily.  Lab Results  Component Value Date   CREATININE 0.90 06/02/2020   BUN 19 06/02/2020   NA 139 06/02/2020   K 3.7 06/02/2020   CL 104 06/02/2020   CO2 26 06/02/2020   She is also c/o left infraclavicular and sternoclavicular joint pain that started while she was in [place] helping her mother.  She thinks she might have pulled a muscle when she was lifting her, she was combative. Mild edema around sternoclavicular joint but no significant deformity.She has hx of OA and this joint has been prominent for sometimes. Negative for left shoulder limited ROM.  Negative for fever, chills, cough, wheezing, dyspnea, palpitation, or diaphoresis. Pain is intermittent, sharp, 2/10, not radiated. She has not taking OTC medications. Pain is exacerbated by movement and lifting. Alleviated by rest. Gradually improving.  Review of Systems  Constitutional: Positive for fatigue. Negative for chills and unexpected weight change.  HENT: Negative for mouth sores and sore throat.   Gastrointestinal: Negative for abdominal pain, nausea and  vomiting.  Musculoskeletal: Negative for gait problem and joint swelling.  Neurological: Negative for syncope and weakness.  Psychiatric/Behavioral: Negative for confusion. The patient is nervous/anxious.   Rest see pertinent positives and negatives per HPI.  Current Outpatient Medications on File Prior to Visit  Medication Sig Dispense Refill  . fluticasone (FLONASE) 50 MCG/ACT nasal spray Use 1 spray(s) in each nostril twice daily 16 g 3  . triamterene-hydrochlorothiazide (MAXZIDE-25) 37.5-25 MG tablet Take 1 tablet by mouth once daily 90 tablet 0   No current facility-administered medications on file prior to visit.    Past Medical History:  Diagnosis Date  . Allergy   . Arthritis   . GERD (gastroesophageal reflux disease)   . Hypertension    No Known Allergies  Social History   Socioeconomic History  . Marital status: Married    Spouse name: Not on file  . Number of children: Not on file  . Years of education: Not on file  . Highest education level: Not on file  Occupational History  . Not on file  Tobacco Use  . Smoking status: Never Smoker  . Smokeless tobacco: Never Used  Substance and Sexual Activity  . Alcohol use: No  . Drug use: No  . Sexual activity: Not Currently  Other Topics Concern  . Not on file  Social History Narrative  . Not on file   Social Determinants of Health   Financial Resource Strain: Not on file  Food Insecurity: Not on file  Transportation Needs: Not  on file  Physical Activity: Not on file  Stress: Not on file  Social Connections: Not on file    Vitals:   12/14/20 1115  BP: 120/70  Pulse: 63  Resp: 16  Temp: (!) 97.5 F (36.4 C)  SpO2: 95%   Body mass index is 32.15 kg/m.  Physical Exam Vitals and nursing note reviewed.  Constitutional:      General: She is not in acute distress.    Appearance: She is well-developed.  HENT:     Head: Normocephalic and atraumatic.     Mouth/Throat:     Mouth: Oropharynx is clear  and moist and mucous membranes are normal.  Eyes:     Conjunctiva/sclera: Conjunctivae normal.  Cardiovascular:     Rate and Rhythm: Normal rate and regular rhythm.     Heart sounds: No murmur heard.   Pulmonary:     Effort: Pulmonary effort is normal. No respiratory distress.     Breath sounds: Normal breath sounds.  Chest:  Breasts:     Right: No axillary adenopathy or supraclavicular adenopathy.     Left: No axillary adenopathy or supraclavicular adenopathy.        Comments: Symmetric chest wall. Abdominal:     Palpations: Abdomen is soft. There is no hepatomegaly or mass.     Tenderness: There is no abdominal tenderness.  Genitourinary:    Comments: Breast: She can not find lesion of concern. I do not appreciate masses,skin changes,or nipple discharge. Mild fibrocystic changes,bilateral with no tenderness. Musculoskeletal:        General: No edema.       Arms:  Lymphadenopathy:     Cervical: No cervical adenopathy.     Upper Body:     Right upper body: No supraclavicular or axillary adenopathy.     Left upper body: No supraclavicular or axillary adenopathy.  Skin:    General: Skin is warm.     Findings: No erythema or rash.  Neurological:     General: No focal deficit present.     Mental Status: She is alert and oriented to person, place, and time.     Gait: Gait normal.     Deep Tendon Reflexes: Strength normal.  Psychiatric:        Mood and Affect: Mood is anxious.     Comments: Well groomed, good eye contact.   ASSESSMENT AND PLAN:  [name] was seen today for pain under breast.  Diagnoses and all orders for this visit:  Muscle strain of anterior chest wall Left infraclavicular area. I do not think imaging is needed today. Monitor for new symptoms. OTC topical IcyHot or Aspercreme applying on area may help.  Arthritis of left sternoclavicular joint This is a chronic problem, most likely exacerbated by muscle strain. Continue monitoring for  now. Topical IcyHot or Aspercreme may help as well as Tylenol 500 mg 3-4 times per day.  Hypertension, essential, benign BP today adequately controlled. Stress and anxiety can certainly cause BP elevation, recommend monitoring BP regularly. Continue triamterene-HCTZ 37.5-25 mg daily and low-salt diet.  In regard to lump under right breast, I did not notice masses and she could not find lesion of concern. Instructed to keep appt for screening mammogram, if she identifies lesion again to let me know. She voices understanding and agrees with plan.  Return if symptoms worsen or fail to improve.   Robin Martinez G. [place], MD  [facility]. [place] [facility].  A few things to remember from today's visit:  Muscle strain of anterior chest wall  Arthritis of left sternoclavicular joint  Hypertension, essential, benign  If you need refills please call your pharmacy. Do not use My Chart to request refills or for acute issues that need immediate attention.   Keep your appointment for mammogram. Topical icy hot on affected area may help. Keep monitoring blood pressure.  Please be sure medication list is accurate. If a new problem present, please set up appointment sooner than planned today.

## 2020-12-14 NOTE — Patient Instructions (Addendum)
A few things to remember from today's visit:   Muscle strain of anterior chest wall  Arthritis of left sternoclavicular joint  Hypertension, essential, benign  If you need refills please call your pharmacy. Do not use My Chart to request refills or for acute issues that need immediate attention.   Keep your appointment for mammogram. Topical icy hot on affected area may help. Keep monitoring blood pressure.  Please be sure medication list is accurate. If a new problem present, please set up appointment sooner than planned today.

## 2021-01-19 ENCOUNTER — Other Ambulatory Visit: Payer: Self-pay | Admitting: Family Medicine

## 2021-01-19 DIAGNOSIS — I10 Essential (primary) hypertension: Secondary | ICD-10-CM

## 2021-01-19 DIAGNOSIS — J309 Allergic rhinitis, unspecified: Secondary | ICD-10-CM

## 2021-01-26 DIAGNOSIS — Z1231 Encounter for screening mammogram for malignant neoplasm of breast: Secondary | ICD-10-CM | POA: Diagnosis not present

## 2021-01-26 LAB — HM MAMMOGRAPHY

## 2021-01-29 ENCOUNTER — Encounter: Payer: Self-pay | Admitting: Family Medicine

## 2021-02-03 ENCOUNTER — Other Ambulatory Visit: Payer: Self-pay

## 2021-02-03 ENCOUNTER — Encounter: Payer: Self-pay | Admitting: Podiatry

## 2021-02-03 ENCOUNTER — Ambulatory Visit: Payer: Medicare Other | Admitting: Podiatry

## 2021-02-03 DIAGNOSIS — Q828 Other specified congenital malformations of skin: Secondary | ICD-10-CM | POA: Diagnosis not present

## 2021-02-03 DIAGNOSIS — M779 Enthesopathy, unspecified: Secondary | ICD-10-CM | POA: Diagnosis not present

## 2021-02-03 MED ORDER — TRIAMCINOLONE ACETONIDE 10 MG/ML IJ SUSP
10.0000 mg | Freq: Once | INTRAMUSCULAR | Status: AC
  Administered 2021-02-03: 10 mg

## 2021-02-05 NOTE — Progress Notes (Signed)
Subjective:   Patient ID: Robin Martinez, female   DOB: 70 y.o.   MRN: [id number]   HPI Patient presents stating she is got painful lesions on both feet and the one on the right foot now has become very inflamed and sore   ROS      Objective:  Physical Exam  Neurovascular status intact with patient's right fourth MPJ becoming inflamed lesion x3 plantar aspect both feet     Assessment:  Inflammatory capsulitis right fourth MPJ along with chronic lesion formation bilateral     Plan:  H&P reviewed condition did a sterile prep and injected the fourth MPJ 3 mg dexamethasone Kenalog 5 mg Xylocaine debrided 3 separate lesions no iatrogenic bleeding reappoint routine care

## 2021-02-17 ENCOUNTER — Ambulatory Visit: Payer: Medicare Other

## 2021-03-11 ENCOUNTER — Telehealth: Payer: Self-pay | Admitting: Family Medicine

## 2021-03-11 NOTE — Telephone Encounter (Signed)
Left message for patient to call back and schedule Medicare Annual Wellness Visit (AWV) either virtually or in office.   AWV-I per [place] 03/11/19 please schedule at anytime with [facility] Nurse Health Advisor 1 or 2   This should be a 45 minute visit.  

## 2021-04-23 ENCOUNTER — Telehealth: Payer: Self-pay | Admitting: Family Medicine

## 2021-04-23 NOTE — Telephone Encounter (Signed)
Left message for patient to call back and schedule Medicare Annual Wellness Visit (AWV) either virtually or in office.   AWV-I per [place] 03/11/19 please schedule at anytime with [facility] Nurse Health Advisor 1 or 2   This should be a 45 minute visit.

## 2021-04-23 NOTE — Telephone Encounter (Signed)
Pt called back and scheduled an appointment for 04/27/2021 at 11:15 with Vernona Rieger

## 2021-04-23 NOTE — Telephone Encounter (Signed)
Documented on spreadsheet 

## 2021-04-27 ENCOUNTER — Ambulatory Visit (INDEPENDENT_AMBULATORY_CARE_PROVIDER_SITE_OTHER): Payer: Medicare Other

## 2021-04-27 DIAGNOSIS — Z0000 Encounter for general adult medical examination without abnormal findings: Secondary | ICD-10-CM

## 2021-04-27 NOTE — Patient Instructions (Signed)
Robin Martinez , Thank you for taking time to come for your Medicare Wellness Visit. I appreciate your ongoing commitment to your health goals. Please review the following plan we discussed and let me know if I can assist you in the future.   Screening recommendations/referrals: Colonoscopy: 10/12/2013  due 2025 Mammogram: 01/26/2021 Bone Density: 12/10/2019 Recommended yearly ophthalmology/optometry visit for glaucoma screening and checkup Recommended yearly dental visit for hygiene and checkup  Vaccinations: Influenza vaccine: due in Fall 2022  Pneumococcal vaccine: will obtain local pharmacy  Tdap vaccine: due with injury  Shingles vaccine: will obtain local pharmacy     Advanced directives: will provide copies   Conditions/risks identified: none   Next appointment: none    Preventive Care 65 Years and Older, Female Preventive care refers to lifestyle choices and visits with your health care provider that can promote health and wellness. What does preventive care include? A yearly physical exam. This is also called an annual well check. Dental exams once or twice a year. Routine eye exams. Ask your health care provider how often you should have your eyes checked. Personal lifestyle choices, including: Daily care of your teeth and gums. Regular physical activity. Eating a healthy diet. Avoiding tobacco and drug use. Limiting alcohol use. Practicing safe sex. Taking low-dose aspirin every day. Taking vitamin and mineral supplements as recommended by your health care provider. What happens during an annual well check? The services and screenings done by your health care provider during your annual well check will depend on your age, overall health, lifestyle risk factors, and family history of disease. Counseling  Your health care provider may ask you questions about your: Alcohol use. Tobacco use. Drug use. Emotional well-being. Home and relationship well-being. Sexual  activity. Eating habits. History of falls. Memory and ability to understand (cognition). Work and work Astronomer. Reproductive health. Screening  You may have the following tests or measurements: Height, weight, and BMI. Blood pressure. Lipid and cholesterol levels. These may be checked every 5 years, or more frequently if you are over 29 years old. Skin check. Lung cancer screening. You may have this screening every year starting at age 37 if you have a 30-pack-year history of smoking and currently smoke or have quit within the past 15 years. Fecal occult blood test (FOBT) of the stool. You may have this test every year starting at age 4. Flexible sigmoidoscopy or colonoscopy. You may have a sigmoidoscopy every 5 years or a colonoscopy every 10 years starting at age 21. Hepatitis C blood test. Hepatitis B blood test. Sexually transmitted disease (STD) testing. Diabetes screening. This is done by checking your blood sugar (glucose) after you have not eaten for a while (fasting). You may have this done every 1-3 years. Bone density scan. This is done to screen for osteoporosis. You may have this done starting at age 49. Mammogram. This may be done every 1-2 years. Talk to your health care provider about how often you should have regular mammograms. Talk with your health care provider about your test results, treatment options, and if necessary, the need for more tests. Vaccines  Your health care provider may recommend certain vaccines, such as: Influenza vaccine. This is recommended every year. Tetanus, diphtheria, and acellular pertussis (Tdap, Td) vaccine. You may need a Td booster every 10 years. Zoster vaccine. You may need this after age 31. Pneumococcal 13-valent conjugate (PCV13) vaccine. One dose is recommended after age 18. Pneumococcal polysaccharide (PPSV23) vaccine. One dose is recommended after age 85.  Talk to your health care provider about which screenings and vaccines  you need and how often you need them. This information is not intended to replace advice given to you by your health care provider. Make sure you discuss any questions you have with your health care provider. Document Released: 10/23/2015 Document Revised: 06/15/2016 Document Reviewed: 07/28/2015 Elsevier Interactive Patient Education  2017 [place] Prevention in the Home Falls can cause injuries. They can happen to people of all ages. There are many things you can do to make your home safe and to help prevent falls. What can I do on the outside of my home? Regularly fix the edges of walkways and driveways and fix any cracks. Remove anything that might make you trip as you walk through a door, such as a raised step or threshold. Trim any bushes or trees on the path to your home. Use bright outdoor lighting. Clear any walking paths of anything that might make someone trip, such as rocks or tools. Regularly check to see if handrails are loose or broken. Make sure that both sides of any steps have handrails. Any raised decks and porches should have guardrails on the edges. Have any leaves, snow, or ice cleared regularly. Use sand or salt on walking paths during winter. Clean up any spills in your garage right away. This includes oil or grease spills. What can I do in the bathroom? Use night lights. Install grab bars by the toilet and in the tub and shower. Do not use towel bars as grab bars. Use non-skid mats or decals in the tub or shower. If you need to sit down in the shower, use a plastic, non-slip stool. Keep the floor dry. Clean up any water that spills on the floor as soon as it happens. Remove soap buildup in the tub or shower regularly. Attach bath mats securely with double-sided non-slip rug tape. Do not have throw rugs and other things on the floor that can make you trip. What can I do in the bedroom? Use night lights. Make sure that you have a light by your bed that  is easy to reach. Do not use any sheets or blankets that are too big for your bed. They should not hang down onto the floor. Have a firm chair that has side arms. You can use this for support while you get dressed. Do not have throw rugs and other things on the floor that can make you trip. What can I do in the kitchen? Clean up any spills right away. Avoid walking on wet floors. Keep items that you use a lot in easy-to-reach places. If you need to reach something above you, use a strong step stool that has a grab bar. Keep electrical cords out of the way. Do not use floor polish or wax that makes floors slippery. If you must use wax, use non-skid floor wax. Do not have throw rugs and other things on the floor that can make you trip. What can I do with my stairs? Do not leave any items on the stairs. Make sure that there are handrails on both sides of the stairs and use them. Fix handrails that are broken or loose. Make sure that handrails are as long as the stairways. Check any carpeting to make sure that it is firmly attached to the stairs. Fix any carpet that is loose or worn. Avoid having throw rugs at the top or bottom of the stairs. If you do have throw  rugs, attach them to the floor with carpet tape. Make sure that you have a light switch at the top of the stairs and the bottom of the stairs. If you do not have them, ask someone to add them for you. What else can I do to help prevent falls? Wear shoes that: Do not have high heels. Have rubber bottoms. Are comfortable and fit you well. Are closed at the toe. Do not wear sandals. If you use a stepladder: Make sure that it is fully opened. Do not climb a closed stepladder. Make sure that both sides of the stepladder are locked into place. Ask someone to hold it for you, if possible. Clearly mark and make sure that you can see: Any grab bars or handrails. First and last steps. Where the edge of each step is. Use tools that help you  move around (mobility aids) if they are needed. These include: Canes. Walkers. Scooters. Crutches. Turn on the lights when you go into a dark area. Replace any light bulbs as soon as they burn out. Set up your furniture so you have a clear path. Avoid moving your furniture around. If any of your floors are uneven, fix them. If there are any pets around you, be aware of where they are. Review your medicines with your doctor. Some medicines can make you feel dizzy. This can increase your chance of falling. Ask your doctor what other things that you can do to help prevent falls. This information is not intended to replace advice given to you by your health care provider. Make sure you discuss any questions you have with your health care provider. Document Released: 07/23/2009 Document Revised: 03/03/2016 Document Reviewed: 10/31/2014 Elsevier Interactive Patient Education  2017 [employer].

## 2021-04-27 NOTE — Progress Notes (Signed)
Subjective:   Robin Martinez is a 70 y.o. female who presents for Medicare Annual (Subsequent) preventive examination.   I connected with Skarleth Delmonico today by telephone and verified that I am speaking with the correct person using two identifiers. Location patient: home Location provider: work Persons participating in the virtual visit: patient, provider.   I discussed the limitations, risks, security and privacy concerns of performing an evaluation and management service by telephone and the availability of in person appointments. I also discussed with the patient that there may be a patient responsible charge related to this service. The patient expressed understanding and verbally consented to this telephonic visit.    Interactive audio and video telecommunications were attempted between this provider and patient, however failed, due to patient having technical difficulties OR patient did not have access to video capability.  We continued and completed visit with audio only.    Review of Systems    N/a       Objective:    There were no vitals filed for this visit. There is no height or weight on file to calculate BMI.  Advanced Directives 12/20/2018  Does Patient Have a Medical Advance Directive? Yes  Type of Estate agent of [place];Living will    Current Medications (verified) Outpatient Encounter Medications as of 04/27/2021  Medication Sig   fluticasone (FLONASE) 50 MCG/ACT nasal spray Use 1 spray(s) in each nostril twice daily   triamterene-hydrochlorothiazide (MAXZIDE-25) 37.5-25 MG tablet Take 1 tablet by mouth once daily   No facility-administered encounter medications on file as of 04/27/2021.    Allergies (verified) Patient has no known allergies.   History: Past Medical History:  Diagnosis Date   Allergy    Arthritis    GERD (gastroesophageal reflux disease)    Hypertension    Past Surgical History:  Procedure Laterality Date    BREAST SURGERY     biopsy   ENDOVENOUS ABLATION SAPHENOUS VEIN W/ LASER Right 12/12/2018   endovenous laser ablation right greater saphenous vein by Fabienne Bruns MD   stab phlebectomy  Right 09/11/2019   stab phlebectomy > 20 incisions right leg by Fabienne Bruns MD    Family History  Problem Relation Age of Onset   Hypertension Mother    Dementia Mother    Stroke Father    Hypertension Father    Cancer Maternal Grandmother        gyn ("vaginal")   Diabetes Maternal Grandfather    Social History   Socioeconomic History   Marital status: Married    Spouse name: Not on file   Number of children: Not on file   Years of education: Not on file   Highest education level: Not on file  Occupational History   Not on file  Tobacco Use   Smoking status: Never   Smokeless tobacco: Never  Substance and Sexual Activity   Alcohol use: No   Drug use: No   Sexual activity: Not Currently  Other Topics Concern   Not on file  Social History Narrative   Not on file   Social Determinants of Health   Financial Resource Strain: Not on file  Food Insecurity: Not on file  Transportation Needs: Not on file  Physical Activity: Not on file  Stress: Not on file  Social Connections: Not on file    Tobacco Counseling Counseling given: Not Answered   Clinical Intake:                 Diabetic?no  Activities of Daily Living In your present state of health, do you have any difficulty performing the following activities: 06/02/2020  Hearing? N  Vision? N  Difficulty concentrating or making decisions? N  Walking or climbing stairs? N  Dressing or bathing? N  Doing errands, shopping? N  Some recent data might be hidden    Patient Care Team: [place], Betty G, MD as PCP - General (Family Medicine)  Indicate any recent Medical Services you may have received from other than [facility] providers in the past year (date may be approximate).     Assessment:   This is a  routine wellness examination for Robin Martinez.  Hearing/Vision screen No results found.  Dietary issues and exercise activities discussed:     Goals Addressed   None    Depression Screen PHQ 2/9 Scores 06/06/2020 11/05/2017  PHQ - 2 Score 0 0    Fall Risk No flowsheet data found.  FALL RISK PREVENTION PERTAINING TO THE HOME:  Any stairs in or around the home? Yes  If so, are there any without handrails? No  Home free of loose throw rugs in walkways, pet beds, electrical cords, etc? Yes  Adequate lighting in your home to reduce risk of falls? Yes   ASSISTIVE DEVICES UTILIZED TO PREVENT FALLS:  Life alert? No  Use of a cane, walker or w/c? No  Grab bars in the bathroom? Yes  Shower chair or bench in shower? Yes  Elevated toilet seat or a handicapped toilet? Yes    Cognitive Function:      Normal cognitive status assessed by direct observation by this Nurse Health Advisor. No abnormalities found.    Immunizations Immunization History  Administered Date(s) Administered   Janssen (J&J) SARS-COV-2 Vaccination 02/19/2020    TDAP status: Due, Education has been provided regarding the importance of this vaccine. Advised may receive this vaccine at local pharmacy or Health Dept. Aware to provide a copy of the vaccination record if obtained from local pharmacy or Health Dept. Verbalized acceptance and understanding.  Flu Vaccine status: Up to date  Pneumococcal vaccine status: Due, Education has been provided regarding the importance of this vaccine. Advised may receive this vaccine at local pharmacy or Health Dept. Aware to provide a copy of the vaccination record if obtained from local pharmacy or Health Dept. Verbalized acceptance and understanding.  Covid-19 vaccine status: Completed vaccines  Qualifies for Shingles Vaccine? Yes   Zostavax completed No   Shingrix Completed?: No.    Education has been provided regarding the importance of this vaccine. Patient has been  advised to call insurance company to determine out of pocket expense if they have not yet received this vaccine. Advised may also receive vaccine at local pharmacy or Health Dept. Verbalized acceptance and understanding.  Screening Tests Health Maintenance  Topic Date Due   Zoster Vaccines- Shingrix (1 of 2) Never done   COVID-19 Vaccine (2 - Booster for Janssen series) 04/15/2020   INFLUENZA VACCINE  05/10/2021   TETANUS/TDAP  01/03/2023   MAMMOGRAM  01/27/2023   COLONOSCOPY (Pts 45-50yrs Insurance coverage will need to be confirmed)  10/13/2023   DEXA SCAN  Completed   Hepatitis C Screening  Completed   HPV VACCINES  Aged Out   PNA vac Low Risk Adult  Discontinued    Health Maintenance  Health Maintenance Due  Topic Date Due   Zoster Vaccines- Shingrix (1 of 2) Never done   COVID-19 Vaccine (2 - Booster for [employer] series) 04/15/2020  Colorectal cancer screening: Type of screening: Colonoscopy. Completed 01/0382015. Repeat every 10 years  Mammogram status: Completed 01/26/2021. Repeat every year  Bone Density status: Completed 12/10/2019. Results reflect: Bone density results: OSTEOPENIA. Repeat every 5 years.  Lung Cancer Screening: (Low Dose CT Chest recommended if Age 52-80 years, 30 pack-year currently smoking OR have quit w/in 15years.) does not qualify.   Lung Cancer Screening Referral: n/a  Additional Screening:  Hepatitis C Screening: does not qualify; Completed 10/30/2017  Vision Screening: Recommended annual ophthalmology exams for early detection of glaucoma and other disorders of the eye. Is the patient up to date with their annual eye exam?  Yes  Who is the provider or what is the name of the office in which the patient attends annual eye exams? [facility]  If pt is not established with a provider, would they like to be referred to a provider to establish care? No .   Dental Screening: Recommended annual dental exams for proper oral hygiene  Community  Resource Referral / Chronic Care Management: CRR required this visit?  No   CCM required this visit?  No      Plan:     I have personally reviewed and noted the following in the patient's chart:   Medical and social history Use of alcohol, tobacco or illicit drugs  Current medications and supplements including opioid prescriptions.  Functional ability and status Nutritional status Physical activity Advanced directives List of other physicians Hospitalizations, surgeries, and ER visits in previous 12 months Vitals Screenings to include cognitive, depression, and falls Referrals and appointments  In addition, I have reviewed and discussed with patient certain preventive protocols, quality metrics, and best practice recommendations. A written personalized care plan for preventive services as well as general preventive health recommendations were provided to patient.     March Rummage, LPN   0/63/0160   Nurse Notes: none

## 2021-05-05 ENCOUNTER — Other Ambulatory Visit: Payer: Self-pay

## 2021-05-05 ENCOUNTER — Ambulatory Visit: Payer: Medicare Other | Admitting: Podiatry

## 2021-05-05 ENCOUNTER — Encounter: Payer: Self-pay | Admitting: Podiatry

## 2021-05-05 DIAGNOSIS — Q828 Other specified congenital malformations of skin: Secondary | ICD-10-CM | POA: Diagnosis not present

## 2021-05-05 NOTE — Progress Notes (Signed)
Subjective:   Patient ID: Robin Martinez, female   DOB: 70 y.o.   MRN: [id number]   HPI Patient presents with painful lesions on both feet states that they are doing okay but they are getting sore again she is getting ready to go out of town   ROS      Objective:  Physical Exam  Neurovascular status intact with keratotic lesions plantar aspect both feet painful when pressed     Assessment:  Chronic lesion formation bilateral     Plan:  Debridement painful lesions reappoint routine care

## 2021-05-25 ENCOUNTER — Other Ambulatory Visit: Payer: Self-pay | Admitting: Family Medicine

## 2021-05-25 DIAGNOSIS — I10 Essential (primary) hypertension: Secondary | ICD-10-CM

## 2021-07-22 ENCOUNTER — Encounter: Payer: Self-pay | Admitting: Podiatry

## 2021-07-22 ENCOUNTER — Other Ambulatory Visit: Payer: Self-pay

## 2021-07-22 ENCOUNTER — Ambulatory Visit: Payer: Medicare Other | Admitting: Podiatry

## 2021-07-22 DIAGNOSIS — Q828 Other specified congenital malformations of skin: Secondary | ICD-10-CM | POA: Diagnosis not present

## 2021-07-23 NOTE — Progress Notes (Signed)
Subjective:   Patient ID: Robin Martinez, female   DOB: 70 y.o.   MRN: [id number]   HPI Patient states she has had a reoccurrence of lesions on both feet with the 1 on the left being worse and states that it has started to become painful again   ROS      Objective:  Physical Exam  Neurovascular status intact with chronic lesion formation plantar aspect both feet localized left over right     Assessment:  Chronic lucid formation with small lesions present bilateral     Plan:  Aggressive sharp debridement accomplished no I neurogenic bleeding reappoint routine care may require injection in future to reduce capsular inflammation

## 2021-09-23 ENCOUNTER — Other Ambulatory Visit: Payer: Self-pay

## 2021-09-23 ENCOUNTER — Encounter: Payer: Self-pay | Admitting: Podiatry

## 2021-09-23 ENCOUNTER — Ambulatory Visit: Payer: Medicare Other | Admitting: Podiatry

## 2021-09-23 DIAGNOSIS — M7752 Other enthesopathy of left foot: Secondary | ICD-10-CM | POA: Diagnosis not present

## 2021-09-23 DIAGNOSIS — Q828 Other specified congenital malformations of skin: Secondary | ICD-10-CM | POA: Diagnosis not present

## 2021-09-23 MED ORDER — TRIAMCINOLONE ACETONIDE 10 MG/ML IJ SUSP
10.0000 mg | Freq: Once | INTRAMUSCULAR | Status: AC
Start: 2021-09-23 — End: 2021-09-23
  Administered 2021-09-23: 10 mg

## 2021-09-24 NOTE — Progress Notes (Signed)
Subjective:   Patient ID: Robin Martinez, female   DOB: 70 y.o.   MRN: [id number]   HPI Patient presents with a number of lesions on the bottom of the left foot and quite a bit of pain in the outside left foot around the fifth metatarsal with patient getting ready to go to [employer] and has trouble walking on her foot   ROS      Objective:  Physical Exam  Neurovascular status intact with inflammation of the plantar capsule fifth MPJ left with fluid buildup and several lesions that have lucent course on the plantar left foot     Assessment:  Inflammatory capsulitis fifth MPJ with porokeratotic lesion x3 left     Plan:  Sterile prep and injected the plantar capsule left 2 mg Dexasone Kenalog 5 mg Xylocaine and debrided separate lesions no iatrogenic bleeding left and reappoint as needed

## 2021-10-08 ENCOUNTER — Other Ambulatory Visit: Payer: Self-pay | Admitting: Family Medicine

## 2021-10-08 DIAGNOSIS — I10 Essential (primary) hypertension: Secondary | ICD-10-CM

## 2021-11-15 ENCOUNTER — Ambulatory Visit (INDEPENDENT_AMBULATORY_CARE_PROVIDER_SITE_OTHER): Payer: No Typology Code available for payment source | Admitting: Family Medicine

## 2021-11-15 ENCOUNTER — Encounter: Payer: Self-pay | Admitting: Family Medicine

## 2021-11-15 VITALS — BP 128/80 | HR 80 | Resp 16 | Ht 63.0 in | Wt 184.1 lb

## 2021-11-15 DIAGNOSIS — E785 Hyperlipidemia, unspecified: Secondary | ICD-10-CM

## 2021-11-15 DIAGNOSIS — I10 Essential (primary) hypertension: Secondary | ICD-10-CM

## 2021-11-15 DIAGNOSIS — G8929 Other chronic pain: Secondary | ICD-10-CM | POA: Diagnosis not present

## 2021-11-15 DIAGNOSIS — R002 Palpitations: Secondary | ICD-10-CM | POA: Diagnosis not present

## 2021-11-15 DIAGNOSIS — R7303 Prediabetes: Secondary | ICD-10-CM | POA: Diagnosis not present

## 2021-11-15 DIAGNOSIS — K219 Gastro-esophageal reflux disease without esophagitis: Secondary | ICD-10-CM | POA: Diagnosis not present

## 2021-11-15 DIAGNOSIS — Z0000 Encounter for general adult medical examination without abnormal findings: Secondary | ICD-10-CM

## 2021-11-15 DIAGNOSIS — M25561 Pain in right knee: Secondary | ICD-10-CM

## 2021-11-15 DIAGNOSIS — M25562 Pain in left knee: Secondary | ICD-10-CM | POA: Diagnosis not present

## 2021-11-15 DIAGNOSIS — Z0001 Encounter for general adult medical examination with abnormal findings: Secondary | ICD-10-CM | POA: Diagnosis not present

## 2021-11-15 LAB — COMPREHENSIVE METABOLIC PANEL
ALT: 16 U/L (ref 0–35)
AST: 15 U/L (ref 0–37)
Albumin: 3.9 g/dL (ref 3.5–5.2)
Alkaline Phosphatase: 85 U/L (ref 39–117)
BUN: 17 mg/dL (ref 6–23)
CO2: 33 mEq/L — ABNORMAL HIGH (ref 19–32)
Calcium: 9.1 mg/dL (ref 8.4–10.5)
Chloride: 104 mEq/L (ref 96–112)
Creatinine, Ser: 0.84 mg/dL (ref 0.40–1.20)
GFR: 71.28 mL/min (ref >60.00)
Glucose, Bld: 84 mg/dL (ref 70–99)
Potassium: 3.9 mEq/L (ref 3.5–5.1)
Sodium: 142 mEq/L (ref 135–145)
Total Bilirubin: 0.8 mg/dL (ref 0.2–1.2)
Total Protein: 6.3 g/dL (ref 6.0–8.3)

## 2021-11-15 LAB — LIPID PANEL
Cholesterol: 198 mg/dL (ref 0–200)
HDL: 63.7 mg/dL (ref >39.00)
LDL Cholesterol: 117 mg/dL — ABNORMAL HIGH (ref 0–99)
NonHDL: 133.88
Total CHOL/HDL Ratio: 3
Triglycerides: 82 mg/dL (ref 0.0–149.0)
VLDL: 16.4 mg/dL (ref 0.0–40.0)

## 2021-11-15 LAB — HEMOGLOBIN A1C: Hgb A1c MFr Bld: 6 % (ref 4.6–6.5)

## 2021-11-15 LAB — TSH: TSH: 2.43 u[IU]/mL (ref 0.35–5.50)

## 2021-11-15 MED ORDER — TRIAMTERENE-HCTZ 37.5-25 MG PO TABS: 1.0000 | ORAL_TABLET | Freq: Every day | ORAL | 2 refills | Status: DC

## 2021-11-15 NOTE — Progress Notes (Signed)
HPI: Robin Martinez is a 71 y.o. female, who is here today for her routine physical and follow up.  Last CPE: 06/02/20  Regular exercise 3 or more time per week: Just started, 2 times per week, [employer]:Walking and bike, and wt lifting. Following a healthful diet: She cooks at home, uses air fryer. She eats a lot of sweets.  Chronic medical problems: Varicose veins,HTN,HLD,OA,and allergic rhinitis.  Immunization History  Administered Date(s) Administered   Janssen (J&J) SARS-COV-2 Vaccination 02/19/2020   Health Maintenance  Topic Date Due   COVID-19 Vaccine (2 - Booster for [employer] series) 12/01/2021 (Originally 04/15/2020)   INFLUENZA VACCINE  01/07/2022 (Originally 05/10/2021)   Zoster Vaccines- Shingrix (1 of 2) 02/12/2022 (Originally 03/17/2003)   Pneumonia Vaccine 52+ Years old (1 - PCV) 11/15/2022 (Originally 03/16/2018)   MAMMOGRAM  01/26/2022   TETANUS/TDAP  01/03/2023   COLONOSCOPY (Pts 45-72yrs Insurance coverage will need to be confirmed)  10/13/2023   DEXA SCAN  Completed   Hepatitis C Screening  Completed   HPV VACCINES  Aged Out   She has some concerns today.  -HLD: She is not on pharmacologic treatment and has not been consistent with following low fat diet. Lab Results  Component Value Date   CHOL 222 (H) 06/02/2020   HDL 74 06/02/2020   LDLCALC 130 (H) 06/02/2020   TRIG 83 06/02/2020   CHOLHDL 3.0 06/02/2020   -Hx of prediabetes:Negative for polydipsia,polyuria, or polyphagia. Last HgA1C was improved.  Lab Results  Component Value Date   HGBA1C 5.4 06/02/2020   -HTN: She is on Triamterene-HCTZ 37.5-25 mg daily. She is not checking BP at home.  Lab Results  Component Value Date   CREATININE 0.90 06/02/2020   BUN 19 06/02/2020   NA 139 06/02/2020   K 3.7 06/02/2020   CL 104 06/02/2020   CO2 26 06/02/2020   -Palpitations,usually when she is in bed and on her left side. Started last summer after her mother died. It has improved. Alleviated  by exercising before bedtime and by changing position to the right. It lasts about 5 min, when she changes position to her right. No symptoms with exercise/exertion.  Hx of GERD, she is having heartburn. She is not on treatment.  Bilateral knee pain that started 1-2 years ago. No hx of trauma. Aggravated by working on a machine in the gym and exacerbated by walking up and down stairs. No associated edema or erythema. She has other joint pains: Shoulder,ankles.  Review of Systems  Constitutional:  Negative for appetite change and fever.  HENT:  Negative for hearing loss, mouth sores, sore throat and trouble swallowing.   Eyes:  Negative for redness and visual disturbance.  Respiratory:  Negative for cough, shortness of breath and wheezing.   Cardiovascular:  Negative for leg swelling.  Gastrointestinal:  Negative for abdominal pain, nausea and vomiting.       No changes in bowel habits.  Endocrine: Negative for cold intolerance, heat intolerance, polydipsia, polyphagia and polyuria.  Genitourinary:  Negative for decreased urine volume, dysuria, hematuria, vaginal bleeding and vaginal discharge.  Musculoskeletal:  Positive for arthralgias. Negative for gait problem and myalgias.  Skin:  Negative for color change and rash.  Allergic/Immunologic: Positive for environmental allergies.  Neurological:  Negative for syncope, weakness and headaches.  Hematological:  Negative for adenopathy. Does not bruise/bleed easily.  Psychiatric/Behavioral:  Positive for sleep disturbance. Negative for confusion. The patient is not nervous/anxious.   All other systems reviewed and are negative.  No current outpatient medications on file prior to visit.   No current facility-administered medications on file prior to visit.   Past Medical History:  Diagnosis Date   Allergy    Arthritis    GERD (gastroesophageal reflux disease)    Hypertension    Past Surgical History:  Procedure Laterality Date    BREAST SURGERY     biopsy   ENDOVENOUS ABLATION SAPHENOUS VEIN W/ LASER Right 12/12/2018   endovenous laser ablation right greater saphenous vein by Ruta Hinds MD   stab phlebectomy  Right 09/11/2019   stab phlebectomy > 20 incisions right leg by Ruta Hinds MD    No Known Allergies  Family History  Problem Relation Age of Onset   Hypertension Mother    Dementia Mother    Stroke Father    Hypertension Father    Cancer Maternal Grandmother        gyn ("vaginal")   Diabetes Maternal Grandfather     Social History   Socioeconomic History   Marital status: Married    Spouse name: Not on file   Number of children: Not on file   Years of education: Not on file   Highest education level: Not on file  Occupational History   Not on file  Tobacco Use   Smoking status: Never   Smokeless tobacco: Never  Substance and Sexual Activity   Alcohol use: No   Drug use: No   Sexual activity: Not Currently  Other Topics Concern   Not on file  Social History Narrative   Not on file   Social Determinants of Health   Financial Resource Strain: Low Risk    Difficulty of Paying Living Expenses: Not hard at all  Food Insecurity: No Food Insecurity   Worried About Charity fundraiser in the Last Year: Never true   [place] in the Last Year: Never true  Transportation Needs: No Transportation Needs   Lack of Transportation (Medical): No   Lack of Transportation (Non-Medical): No  Physical Activity: Sufficiently Active   Days of Exercise per Week: 3 days   Minutes of Exercise per Session: 60 min  Stress: No Stress Concern Present   Feeling of Stress : Not at all  Social Connections: Moderately Integrated   Frequency of Communication with Friends and Family: Twice a week   Frequency of Social Gatherings with Friends and Family: Twice a week   Attends Religious Services: More than 4 times per year   Active Member of Clubs or Organizations: No   Attends Archivist  Meetings: Never   Marital Status: Married   Vitals:   11/15/21 0835  BP: 128/80  Pulse: 80  Resp: 16  SpO2: 99%   Body mass index is 32.62 kg/m.  Wt Readings from Last 3 Encounters:  11/15/21 184 lb 2 oz (83.5 kg)  12/14/20 181 lb 8 oz (82.3 kg)  06/02/20 180 lb (81.6 kg)   Physical Exam Vitals and nursing note reviewed.  Constitutional:      General: She is not in acute distress.    Appearance: She is well-developed.  HENT:     Head: Normocephalic and atraumatic.     Right Ear: Hearing, tympanic membrane, ear canal and external ear normal.     Left Ear: Hearing, tympanic membrane, ear canal and external ear normal.     Mouth/Throat:     Mouth: Mucous membranes are moist.     Pharynx: Oropharynx is clear. Uvula midline.  Eyes:     Extraocular Movements: Extraocular movements intact.     Conjunctiva/sclera: Conjunctivae normal.     Pupils: Pupils are equal, round, and reactive to light.  Neck:     Thyroid: No thyromegaly.     Trachea: No tracheal deviation.  Cardiovascular:     Rate and Rhythm: Normal rate and regular rhythm.     Pulses:          Dorsalis pedis pulses are 2+ on the right side and 2+ on the left side.     Heart sounds: No murmur heard. Pulmonary:     Effort: Pulmonary effort is normal. No respiratory distress.     Breath sounds: Normal breath sounds.  Abdominal:     Palpations: Abdomen is soft. There is no hepatomegaly or mass.     Tenderness: There is no abdominal tenderness.  Musculoskeletal:     Right knee: Crepitus present. No bony tenderness.     Left knee: Crepitus present. No bony tenderness. Decreased range of motion (Mild, flexion).     Comments: No signs of synovitis appreciated.  Lymphadenopathy:     Cervical: No cervical adenopathy.     Upper Body:     Right upper body: No supraclavicular adenopathy.     Left upper body: No supraclavicular adenopathy.  Skin:    General: Skin is warm.     Findings: No erythema or rash.   Neurological:     General: No focal deficit present.     Mental Status: She is alert and oriented to person, place, and time.     Cranial Nerves: No cranial nerve deficit.     Coordination: Coordination normal.     Gait: Gait normal.     Deep Tendon Reflexes:     Reflex Scores:      Bicep reflexes are 2+ on the right side and 2+ on the left side.      Patellar reflexes are 2+ on the right side and 2+ on the left side. Psychiatric:        Speech: Speech normal.     Comments: Well groomed, good eye contact.   ASSESSMENT AND PLAN:  Robin Martinez was here today annual physical examination.  Orders Placed This Encounter  Procedures   TSH   Comprehensive metabolic panel   Hemoglobin A1c   Lipid panel   EKG 12-Lead   Lab Results  Component Value Date   HGBA1C 6.0 11/15/2021   Lab Results  Component Value Date   CHOL 198 11/15/2021   HDL 63.70 11/15/2021   LDLCALC 117 (H) 11/15/2021   TRIG 82.0 11/15/2021   CHOLHDL 3 11/15/2021   Lab Results  Component Value Date   CREATININE 0.84 11/15/2021   BUN 17 11/15/2021   NA 142 11/15/2021   K 3.9 11/15/2021   CL 104 11/15/2021   CO2 33 (H) 11/15/2021   Lab Results  Component Value Date   ALT 16 11/15/2021   AST 15 11/15/2021   ALKPHOS 85 11/15/2021   BILITOT 0.8 11/15/2021   Lab Results  Component Value Date   TSH 2.43 11/15/2021   Routine general medical examination at a health care facility We discussed the importance of regular physical activity and healthy diet for prevention of chronic illness and/or complications. Preventive guidelines reviewed. Vaccination up to date. Ca++ and vit D supplementation to continue. Next CPE in a year. The 10-year ASCVD risk score (Arnett DK, et al., 2019) is: 10.6%   Values used to calculate  the score:     Age: 19 years     Sex: Female     Is Non-Hispanic African American: Yes     Diabetic: No     Tobacco smoker: No     Systolic Blood Pressure: 0000000 mmHg     Is BP  treated: Yes     HDL Cholesterol: 63.7 mg/dL     Total Cholesterol: 198 mg/dL  Palpitations We discussed possible etiologies. Hx does not suggest a serious process. EKG today:SR, normal axis and intervals, ? IVCD, and LAE. No other EKG for comparison. I do not think further work up is needed, instructed about warning signs.  Chronic pain of both knees Most likely OA. I do not think imaging is needed at this time. Avoid activities that aggravate problem like frequently walking stairs,kneeling,or squatting.   Hyperlipidemia Non pharmacologic treatment recommended for now. Further recommendations will be given according to lipid panel numbers.  Hypertension, essential, benign BP adequately controlled. Low salt diet to continue. Monitor BP at home periodically. Continue Triamterene-HCTZ 37.5-25 mg daily. Eye exam current.  Prediabetes Encouraged a healthy life style for diabetes prevention. Last HgA1C was normal, it has been 5.9 in 06/2019. Further recommendations according to HgA1C result.  GERD (gastroesophageal reflux disease) GERD precautions recommended for now. If persistent Omeprazole 20 mg daily prn.  Return in 1 year (on 11/15/2022) for CPE and follow up.  Kapil Petropoulos G. [place], MD  [facility]. [place] office.

## 2021-11-15 NOTE — Patient Instructions (Addendum)
A few things to remember from today's visit:   Routine general medical examination at a health care facility  Hyperlipidemia, unspecified hyperlipidemia type - Plan: Comprehensive metabolic panel, Lipid panel  Prediabetes - Plan: Comprehensive metabolic panel, Hemoglobin A1c  Hypertension, essential, benign - Plan: Comprehensive metabolic panel  Palpitations - Plan: EKG 12-Lead, TSH  Chronic pain of both knees  If you need refills please call your pharmacy. Do not use My Chart to request refills or for acute issues that need immediate attention.   Please be sure medication list is accurate. If a new problem present, please set up appointment sooner than planned today. Knee pain suggest osteoarthritis, avoid activities that aggravate pain. Palpitations you are reporting do not sound like something worrisome. Could be related to GERD. Monitor for new symptoms.  Preventive Care 46 Years and Older, Female Preventive care refers to lifestyle choices and visits with your health care provider that can promote health and wellness. Preventive care visits are also called wellness exams. What can I expect for my preventive care visit? Counseling Your health care provider may ask you questions about your: Medical history, including: Past medical problems. Family medical history. Pregnancy and menstrual history. History of falls. Current health, including: Memory and ability to understand (cognition). Emotional well-being. Home life and relationship well-being. Sexual activity and sexual health. Lifestyle, including: Alcohol, nicotine or tobacco, and drug use. Access to firearms. Diet, exercise, and sleep habits. Work and work Statistician. Sunscreen use. Safety issues such as seatbelt and bike helmet use. Physical exam Your health care provider will check your: Height and weight. These may be used to calculate your BMI (body mass index). BMI is a measurement that tells if you are  at a healthy weight. Waist circumference. This measures the distance around your waistline. This measurement also tells if you are at a healthy weight and may help predict your risk of certain diseases, such as type 2 diabetes and high blood pressure. Heart rate and blood pressure. Body temperature. Skin for abnormal spots. What immunizations do I need? Vaccines are usually given at various ages, according to a schedule. Your health care provider will recommend vaccines for you based on your age, medical history, and lifestyle or other factors, such as travel or where you work. What tests do I need? Screening Your health care provider may recommend screening tests for certain conditions. This may include: Lipid and cholesterol levels. Hepatitis C test. Hepatitis B test. HIV (human immunodeficiency virus) test. STI (sexually transmitted infection) testing, if you are at risk. Lung cancer screening. Colorectal cancer screening. Diabetes screening. This is done by checking your blood sugar (glucose) after you have not eaten for a while (fasting). Mammogram. Talk with your health care provider about how often you should have regular mammograms. BRCA-related cancer screening. This may be done if you have a family history of breast, ovarian, tubal, or peritoneal cancers. Bone density scan. This is done to screen for osteoporosis. Talk with your health care provider about your test results, treatment options, and if necessary, the need for more tests. Follow these instructions at home: Eating and drinking  Eat a diet that includes fresh fruits and vegetables, whole grains, lean protein, and low-fat dairy products. Limit your intake of foods with high amounts of sugar, saturated fats, and salt. Take vitamin and mineral supplements as recommended by your health care provider. Do not drink alcohol if your health care provider tells you not to drink. If you drink alcohol: Limit how much you  have to  0-1 drink a day. Know how much alcohol is in your drink. In the [place] one drink equals one 12 oz bottle of beer (355 mL), one 5 oz glass of wine (148 mL), or one 1 oz glass of hard liquor (44 mL). Lifestyle Brush your teeth every morning and night with fluoride toothpaste. Floss one time each day. Exercise for at least 30 minutes 5 or more days each week. Do not use any products that contain nicotine or tobacco. These products include cigarettes, chewing tobacco, and vaping devices, such as e-cigarettes. If you need help quitting, ask your health care provider. Do not use drugs. If you are sexually active, practice safe sex. Use a condom or other form of protection in order to prevent STIs. Take aspirin only as told by your health care provider. Make sure that you understand how much to take and what form to take. Work with your health care provider to find out whether it is safe and beneficial for you to take aspirin daily. Ask your health care provider if you need to take a cholesterol-lowering medicine (statin). Find healthy ways to manage stress, such as: Meditation, yoga, or listening to music. Journaling. Talking to a trusted person. Spending time with friends and family. Minimize exposure to UV radiation to reduce your risk of skin cancer. Safety Always wear your seat belt while driving or riding in a vehicle. Do not drive: If you have been drinking alcohol. Do not ride with someone who has been drinking. When you are tired or distracted. While texting. If you have been using any mind-altering substances or drugs. Wear a helmet and other protective equipment during sports activities. If you have firearms in your house, make sure you follow all gun safety procedures. What's next? Visit your health care provider once a year for an annual wellness visit. Ask your health care provider how often you should have your eyes and teeth checked. Stay up to date on all vaccines. This  information is not intended to replace advice given to you by your health care provider. Make sure you discuss any questions you have with your health care provider. Document Revised: 03/24/2021 Document Reviewed: 03/24/2021 Elsevier Patient Education  [place].

## 2021-11-15 NOTE — Assessment & Plan Note (Signed)
GERD precautions recommended for now. If persistent Omeprazole 20 mg daily prn.

## 2021-11-15 NOTE — Assessment & Plan Note (Addendum)
BP adequately controlled. Low salt diet to continue. Monitor BP at home periodically. Continue Triamterene-HCTZ 37.5-25 mg daily. Eye exam current.

## 2021-11-15 NOTE — Assessment & Plan Note (Addendum)
Non pharmacologic treatment recommended for now. Further recommendations will be given according to lipid panel numbers.  

## 2021-11-15 NOTE — Assessment & Plan Note (Signed)
Encouraged a healthy life style for diabetes prevention. Last HgA1C was normal, it has been 5.9 in 06/2019. Further recommendations according to HgA1C result.

## 2021-12-17 ENCOUNTER — Other Ambulatory Visit: Payer: Self-pay

## 2021-12-17 ENCOUNTER — Ambulatory Visit (INDEPENDENT_AMBULATORY_CARE_PROVIDER_SITE_OTHER): Payer: No Typology Code available for payment source | Admitting: Podiatry

## 2021-12-17 ENCOUNTER — Encounter: Payer: Self-pay | Admitting: Podiatry

## 2021-12-17 DIAGNOSIS — Q828 Other specified congenital malformations of skin: Secondary | ICD-10-CM

## 2021-12-19 NOTE — Progress Notes (Signed)
Subjective:  ? ?Patient ID: Robin Martinez, female   DOB: 71 y.o.   MRN: [id number]  ? ?HPI ?Patient states she is much improved does have lesions on both feet which can become painful but doing much better from the previous condition ? ? ?ROS ? ? ?   ?Objective:  ?Physical Exam  ?Neuro vascular status intact with diminished discomfort with diminished fluid around the MPJ secondary to capsular injection with chronic keratotic lesion x2/5 and third metatarsal bilateral with lucent course ? ?   ?Assessment:  ?Imp proved from inflammatory capsulitis fifth MPJ with porokeratotic lesion bilateral ? ?   ?Plan:  ?No other treatment for capsulitis debrided lesions bilateral no iatrogenic bleeding reappoint routine care ?   ? ? ?

## 2022-01-24 ENCOUNTER — Telehealth: Payer: Self-pay | Admitting: Family Medicine

## 2022-01-24 NOTE — Progress Notes (Signed)
? ?ACUTE VISIT ?Chief Complaint  ?Patient presents with  ? Breathing Problem  ?  Waking up gasping for air, having low energy & legs are starting to hurt again.  ? ?HPI: ?[name] is a 71 y.o. female, who is here today complaining of intermittent episodes of difficulty breathing as described above. ? ?Problem has been going on for about 10 days. ?Sudden onset, intermittent, she feels like "my breath goes and comes" mainly at night when in bed, wakes her up. She also has episodes during the day. No associated symptoms. ? ?+ Fatigue. ?She feels rested in the morning, fatigue starts in the meddle of the day and worse at night. ?Falls asleep watching TV. ?Takes naps during the day. ? ?Frontal headache, intermittent, "not bad." ?She has not tried OTC medications. ? ?Breathing Problem ?She complains of difficulty breathing and shortness of breath. There is no chest tightness, cough, frequent throat clearing, hemoptysis, hoarse voice, sputum production or wheezing. This is a new problem. The current episode started 1 to 4 weeks ago. The problem has been unchanged. Associated symptoms include nasal congestion, postnasal drip and rhinorrhea. Pertinent negatives include no appetite change, dyspnea on exertion, ear congestion, ear pain, fever, heartburn, malaise/fatigue, orthopnea, PND, sore throat, sweats, trouble swallowing or weight loss. Her symptoms are aggravated by nothing. There are no known risk factors for lung disease.  ? ?Still having palpitations, sudden onset, usually at rest. ?No associated CP or diaphoresis. ?LE pain, this is a chronic problem. Hx of vein disease, she follows with vascular. She has an appt in a few days. Negative for edema and erythema. ? ?She acknowledges she has not been drinking enough fluids. ? ?Review of Systems  ?Constitutional:  Negative for activity change, appetite change, fever, malaise/fatigue and weight loss.  ?HENT:  Positive for postnasal drip and rhinorrhea. Negative for  ear pain, hoarse voice, mouth sores, nosebleeds, sore throat and trouble swallowing.   ?Eyes:  Negative for redness and visual disturbance.  ?Respiratory:  Positive for shortness of breath. Negative for cough, hemoptysis, sputum production and wheezing.   ?Cardiovascular:  Negative for dyspnea on exertion and PND.  ?Gastrointestinal:  Negative for abdominal pain, heartburn, nausea and vomiting.  ?     Negative for changes in bowel habits.  ?Genitourinary:  Negative for decreased urine volume, dysuria and hematuria.  ?Musculoskeletal:  Negative for gait problem.  ?Skin:  Negative for pallor and rash.  ?Allergic/Immunologic: Positive for environmental allergies.  ?Neurological:  Negative for syncope and weakness.  ?Psychiatric/Behavioral:  Negative for confusion. The patient is nervous/anxious.   ?Rest see pertinent positives and negatives per HPI. ? ?Current Outpatient Medications on File Prior to Visit  ?Medication Sig Dispense Refill  ? triamterene-hydrochlorothiazide (MAXZIDE-25) 37.5-25 MG tablet Take 1 tablet by mouth daily. Appointment due 90 tablet 2  ? ?No current facility-administered medications on file prior to visit.  ? ?Past Medical History:  ?Diagnosis Date  ? Allergy   ? Arthritis   ? GERD (gastroesophageal reflux disease)   ? Hypertension   ? ?No Known Allergies ? ?Social History  ? ?Socioeconomic History  ? Marital status: Married  ?  Spouse name: Not on file  ? Number of children: Not on file  ? Years of education: Not on file  ? Highest education level: Not on file  ?Occupational History  ? Not on file  ?Tobacco Use  ? Smoking status: Never  ? Smokeless tobacco: Never  ?Substance and Sexual Activity  ? Alcohol use: No  ?  Drug use: No  ? Sexual activity: Not Currently  ?Other Topics Concern  ? Not on file  ?Social History Narrative  ? Not on file  ? ?Social Determinants of Health  ? ?Financial Resource Strain: Low Risk   ? Difficulty of Paying Living Expenses: Not hard at all  ?Food Insecurity: No  Food Insecurity  ? Worried About Programme researcher, broadcasting/film/videounning Out of Food in the Last Year: Never true  ? Ran Out of Food in the Last Year: Never true  ?Transportation Needs: No Transportation Needs  ? Lack of Transportation (Medical): No  ? Lack of Transportation (Non-Medical): No  ?Physical Activity: Sufficiently Active  ? Days of Exercise per Week: 3 days  ? Minutes of Exercise per Session: 60 min  ?Stress: No Stress Concern Present  ? Feeling of Stress : Not at all  ?Social Connections: Moderately Integrated  ? Frequency of Communication with Friends and Family: Twice a week  ? Frequency of Social Gatherings with Friends and Family: Twice a week  ? Attends Religious Services: More than 4 times per year  ? Active Member of Clubs or Organizations: No  ? Attends BankerClub or Organization Meetings: Never  ? Marital Status: Married  ? ?Vitals:  ? 01/25/22 0951  ?BP: 128/80  ?Pulse: 87  ?Resp: 16  ?SpO2: 97%  ? ?Body mass index is 31.8 kg/m?. ? ?Physical Exam ?Vitals and nursing note reviewed.  ?Constitutional:   ?   General: She is not in acute distress. ?   Appearance: She is well-developed.  ?HENT:  ?   Head: Normocephalic and atraumatic.  ?   Mouth/Throat:  ?   Mouth: Mucous membranes are moist.  ?Eyes:  ?   Conjunctiva/sclera: Conjunctivae normal.  ?Cardiovascular:  ?   Rate and Rhythm: Regular rhythm. Tachycardia present.  ?   Pulses:     ?     Dorsalis pedis pulses are 2+ on the right side and 2+ on the left side.  ?   Heart sounds: No murmur heard. ?   Comments: HR 112/min. ?Varicose vein LE, bilateral. ?Pulmonary:  ?   Effort: Pulmonary effort is normal. No respiratory distress.  ?   Breath sounds: Normal breath sounds.  ?Abdominal:  ?   Palpations: Abdomen is soft. There is no hepatomegaly or mass.  ?   Tenderness: There is no abdominal tenderness.  ?Lymphadenopathy:  ?   Cervical: No cervical adenopathy.  ?Skin: ?   General: Skin is warm.  ?   Findings: No erythema or rash.  ?Neurological:  ?   General: No focal deficit present.  ?    Mental Status: She is alert and oriented to person, place, and time.  ?   Cranial Nerves: No cranial nerve deficit.  ?   Gait: Gait normal.  ?Psychiatric:     ?   Mood and Affect: Mood is anxious.  ?   Comments: Well groomed, good eye contact.  ? ? ? ?ASSESSMENT AND PLAN: ? ?[name] was seen today for breathing problem. ? ?Diagnoses and all orders for this visit: ?Orders Placed This Encounter  ?Procedures  ? DG Chest 2 View  ? CBC  ? Basic metabolic panel  ? Ambulatory referral to Sleep Studies  ? Ambulatory referral to Cardiology  ? EKG 12-Lead  ? ?Lab Results  ?Component Value Date  ? CREATININE 0.93 01/25/2022  ? BUN 19 01/25/2022  ? NA 140 01/25/2022  ? K 4.0 01/25/2022  ? CL 102 01/25/2022  ? CO2 32 01/25/2022  ? ?  Lab Results  ?Component Value Date  ? WBC 4.4 01/25/2022  ? HGB 14.3 01/25/2022  ? HCT 43.2 01/25/2022  ? MCV 89.2 01/25/2022  ? PLT 232.0 01/25/2022  ? ?SOB (shortness of breath) ?Unspecific, intermittent for seconds through the day. ?We discussed possible causes. ?CXR will be obtained today. ? ?Pain in both lower extremities ?Pulses are present. ?Could be caused by varicose veins. ?She has appt with vascular. ?Compression stockings recommended. ? ?Allergic rhinitis due to pollen, unspecified seasonality ?This problem could be contributing to her "breathing problems." ?Flonase causes nose bleed. ?Nasal saline irrigations as needed. ? ?Palpitations ?Persistent since summer 2022. ?Today mild tachycardia noted during visit. ?EKG today: Sinus tachycardia, normal axis and intervals. ? LAE. Compared with EKG done on 11/15/21 tachycardia has replaced mild bradycardia. ?Cardiology referral placed. ?Instructed about warning signs. ? ?Sleep apnea, unspecified type ?Episodes she described at night that wakes her up + fatigue, OSA to be ruled out. ?Sleep study will be arranged. ? ?Return if symptoms worsen or fail to improve. ? ?Kelsei Defino G. [place], MD ? ?[facility]. ?[place] office. ? ? ?

## 2022-01-25 ENCOUNTER — Encounter: Payer: Self-pay | Admitting: Family Medicine

## 2022-01-25 ENCOUNTER — Ambulatory Visit (INDEPENDENT_AMBULATORY_CARE_PROVIDER_SITE_OTHER): Payer: No Typology Code available for payment source

## 2022-01-25 ENCOUNTER — Ambulatory Visit (INDEPENDENT_AMBULATORY_CARE_PROVIDER_SITE_OTHER): Payer: No Typology Code available for payment source | Admitting: Family Medicine

## 2022-01-25 VITALS — BP 128/80 | HR 87 | Resp 16 | Ht 63.0 in | Wt 179.5 lb

## 2022-01-25 DIAGNOSIS — J301 Allergic rhinitis due to pollen: Secondary | ICD-10-CM

## 2022-01-25 DIAGNOSIS — R002 Palpitations: Secondary | ICD-10-CM | POA: Diagnosis not present

## 2022-01-25 DIAGNOSIS — R0602 Shortness of breath: Secondary | ICD-10-CM | POA: Diagnosis not present

## 2022-01-25 DIAGNOSIS — G4730 Sleep apnea, unspecified: Secondary | ICD-10-CM | POA: Diagnosis not present

## 2022-01-25 DIAGNOSIS — M79604 Pain in right leg: Secondary | ICD-10-CM

## 2022-01-25 DIAGNOSIS — I517 Cardiomegaly: Secondary | ICD-10-CM | POA: Diagnosis not present

## 2022-01-25 DIAGNOSIS — M79605 Pain in left leg: Secondary | ICD-10-CM | POA: Diagnosis not present

## 2022-01-25 LAB — BASIC METABOLIC PANEL
BUN: 19 mg/dL (ref 6–23)
CO2: 32 mEq/L (ref 19–32)
Calcium: 9.5 mg/dL (ref 8.4–10.5)
Chloride: 102 mEq/L (ref 96–112)
Creatinine, Ser: 0.93 mg/dL (ref 0.40–1.20)
GFR: 63 mL/min (ref >60.00)
Glucose, Bld: 76 mg/dL (ref 70–99)
Potassium: 4 mEq/L (ref 3.5–5.1)
Sodium: 140 mEq/L (ref 135–145)

## 2022-01-25 LAB — CBC
HCT: 43.2 % (ref 36.0–46.0)
Hemoglobin: 14.3 g/dL (ref 12.0–15.0)
MCHC: 33.2 g/dL (ref 30.0–36.0)
MCV: 89.2 fl (ref 78.0–100.0)
Platelets: 232 10*3/uL (ref 150.0–400.0)
RBC: 4.84 Mil/uL (ref 3.87–5.11)
RDW: 13.8 % (ref 11.5–15.5)
WBC: 4.4 10*3/uL (ref 4.0–10.5)

## 2022-01-25 NOTE — Patient Instructions (Addendum)
A few things to remember from today's visit: ? ?Sleep apnea, unspecified type - Plan: Ambulatory referral to Sleep Studies ? ?Allergic rhinitis due to pollen, unspecified seasonality ? ?Palpitations - Plan: EKG 12-Lead, Ambulatory referral to Cardiology, CBC ? ?SOB (shortness of breath) - Plan: DG Chest 2 View ? ?Pain in both lower extremities ? ?If you need refills please call your pharmacy. ?Do not use My Chart to request refills or for acute issues that need immediate attention. ?  ?Continue monitoring heart rate. ?Sleep study will be arranged. ?Breathing problems can be aggravated by allergies. ? ?Please be sure medication list is accurate. ?If a new problem present, please set up appointment sooner than planned today. ? ? ? ? ? ? ? ?

## 2022-02-01 LAB — HM MAMMOGRAPHY

## 2022-02-02 ENCOUNTER — Encounter: Payer: Self-pay | Admitting: Family Medicine

## 2022-02-08 ENCOUNTER — Encounter: Payer: Self-pay | Admitting: Family Medicine

## 2022-02-08 DIAGNOSIS — Z0100 Encounter for examination of eyes and vision without abnormal findings: Secondary | ICD-10-CM

## 2022-02-09 ENCOUNTER — Ambulatory Visit (INDEPENDENT_AMBULATORY_CARE_PROVIDER_SITE_OTHER): Payer: No Typology Code available for payment source | Admitting: Interventional Cardiology

## 2022-02-09 ENCOUNTER — Encounter: Payer: Self-pay | Admitting: Interventional Cardiology

## 2022-02-09 VITALS — BP 132/70 | HR 78 | Ht 62.0 in | Wt 180.0 lb

## 2022-02-09 DIAGNOSIS — R0602 Shortness of breath: Secondary | ICD-10-CM

## 2022-02-09 DIAGNOSIS — I10 Essential (primary) hypertension: Secondary | ICD-10-CM | POA: Diagnosis not present

## 2022-02-09 NOTE — Progress Notes (Signed)
?  ?Cardiology Office Note ? ? ?Date:  02/09/2022  ? ?ID:  Robin Martinez, DOB [birth year], MRN [id number] ? ?PCP:  Robin Martinez  ? ? ?No chief complaint on file. ? ?Shortness of breath ? ?Wt Readings from Last 3 Encounters:  ?02/09/22 180 lb (81.6 kg)  ?01/25/22 179 lb 8 oz (81.4 kg)  ?11/15/21 184 lb 2 oz (83.5 kg)  ?  ? ?  ?History of Present Illness: ?Robin Martinez is a 71 y.o. female who is being seen today for the evaluation of shortness of breath at the request of [place], Robin Expose, Martinez.  ? ?She has had palpitations for years.  A few weeks ago,she noted a sudden shortness of breath that resolved with a gasp. No prior cardiac testing.  Sx have improved.  ? ?PMD, her heart rate was during the office visit. ?Palpitations worse with stress.  ? ?Denies : Chest pain. Dizziness. Leg edema. Nitroglycerin use. Orthopnea.  Paroxysmal nocturnal dyspnea. Syncope.   ? ?She goes for a walk 3 x/week, either outside or at the gym or on the treadmill.  No chest pain with that activity. ? ? ?Past Medical History:  ?Diagnosis Date  ? Allergy   ? Arthritis   ? GERD (gastroesophageal reflux disease)   ? Hypertension   ? ? ?Past Surgical History:  ?Procedure Laterality Date  ? BREAST SURGERY    ? biopsy  ? ENDOVENOUS ABLATION SAPHENOUS VEIN W/ LASER Right 12/12/2018  ? endovenous laser ablation right greater saphenous vein by Fabienne Bruns Martinez  ? stab phlebectomy  Right 09/11/2019  ? stab phlebectomy > 20 incisions right leg by Fabienne Bruns Martinez   ? ? ? ?Current Outpatient Medications  ?Medication Sig Dispense Refill  ? triamterene-hydrochlorothiazide (MAXZIDE-25) 37.5-25 MG tablet Take 1 tablet by mouth daily. Appointment due 90 tablet 2  ? ?No current facility-administered medications for this visit.  ? ? ?Allergies:   Patient has no known allergies.  ? ? ?Social History:  The patient  reports that she has never smoked. She has never used smokeless tobacco. She reports that she does not drink alcohol and does not use drugs.   ? ?Family History:  The patient's \\family  history includes Cancer in her maternal grandmother; Dementia in her mother; Diabetes in her maternal grandfather; Hypertension in her father and mother; Stroke in her father.  ? ? ?ROS:  Please see the history of present illness.   Otherwise, review of systems are positive for palpitations.   All other systems are reviewed and negative.  ? ? ?PHYSICAL EXAM: ?VS:  BP 132/70   Pulse 78   Ht 5\' 2"  (1.575 m)   Wt 180 lb (81.6 kg)   SpO2 98%   BMI 32.92 kg/m?  , BMI Body mass index is 32.92 kg/m?. ?GEN: Well nourished, well developed, in no acute distress ?HEENT: normal ?Neck: no JVD, carotid bruits, or masses ?Cardiac: RRR; no murmurs, rubs, or gallops,no edema  ?Respiratory:  clear to auscultation bilaterally, normal work of breathing ?GI: soft, nontender, nondistended, + BS ?MS: no deformity or atrophy ?Skin: warm and dry, no rash ?Neuro:  Strength and sensation are intact ?Psych: euthymic mood, full affect ? ? ?EKG:   ?The ekg ordered today demonstrates sinus tachycardia, Q waves in V1 ? ? ?Recent Labs: ?11/15/2021: ALT 16; TSH 2.43 ?01/25/2022: BUN 19; Creatinine, Ser 0.93; Hemoglobin 14.3; Platelets 232.0; Potassium 4.0; Sodium 140  ? ?Lipid Panel ?   ?Component Value Date/Time  ? CHOL 198  11/15/2021 0930  ? TRIG 82.0 11/15/2021 0930  ? HDL 63.70 11/15/2021 0930  ? CHOLHDL 3 11/15/2021 0930  ? VLDL 16.4 11/15/2021 0930  ? LDLCALC 117 (H) 11/15/2021 0930  ? LDLCALC 130 (H) 06/02/2020 1024  ? ?  ?Other studies Reviewed: ?Additional studies/ records that were reviewed today with results demonstrating: LDL 117 in 4/23. ? ? ?ASSESSMENT AND PLAN: ? ?Shortness of breath: Some tachycardia noted as well.  Plan for echocardiogram.  Exam is normal.  There may be a component of anxiety.      ?HTN: The current medical regimen is effective;  continue present plan and medications. ?Calcium scoring CT recommended for screening for CAD given her age and h/o HTN. ? ? ?Current medicines  are reviewed at length with the patient today.  The patient concerns regarding her medicines were addressed. ? ?The following changes have been made:  No change ? ?Labs/ tests ordered today include: Echocardiogram, calcium scoring CT ?No orders of the defined types were placed in this encounter. ? ? ?Recommend 150 minutes/week of aerobic exercise ?Low fat, low carb, high fiber diet recommended ? ?Disposition:   FU in as needed based on results of tests ? ? ?Signed, ?Lance Muss, Martinez  ?02/09/2022 2:17 PM    ?[facility] ?[address] [place], [place], [place]  [address] ?Phone: [phone]; Fax: [phone]  ? ?

## 2022-02-09 NOTE — Patient Instructions (Signed)
Medication Instructions:  ?Your physician recommends that you continue on your current medications as directed. Please refer to the Current Medication list given to you today. ? ?*If you need a refill on your cardiac medications before your next appointment, please call your pharmacy* ? ? ?Lab Work: ?none ?If you have labs (blood work) drawn today and your tests are completely normal, you will receive your results only by: ?MyChart Message (if you have MyChart) OR ?A paper copy in the mail ?If you have any lab test that is abnormal or we need to change your treatment, we will call you to review the results. ? ? ?Testing/Procedures: ?Your physician has requested that you have an echocardiogram. Echocardiography is a painless test that uses sound waves to create images of your heart. It provides your doctor with information about the size and shape of your heart and how well your heart?s chambers and valves are working. This procedure takes approximately one hour. There are no restrictions for this procedure. ? ?Dr Eldridge Dace recommends you have a Calcium Score CT Scan ? ? ?Follow-Up: ?At [facility], you and your health needs are our priority.  As part of our continuing mission to provide you with exceptional heart care, we have created designated Provider Care Teams.  These Care Teams include your primary Cardiologist (physician) and Advanced Practice Providers (APPs -  Physician Assistants and Nurse Practitioners) who all work together to provide you with the care you need, when you need it. ? ?We recommend signing up for the patient portal called "MyChart".  Sign up information is provided on this After Visit Summary.  MyChart is used to connect with patients for Virtual Visits (Telemedicine).  Patients are able to view lab/test results, encounter notes, upcoming appointments, etc.  Non-urgent messages can be sent to your provider as well.   ?To learn more about what you can do with MyChart, go to  [email or web address].   ? ?Your next appointment:   ?As needed ? ?The format for your next appointment:   ?In Person ? ?Provider:   ?Lance Muss, MD   ? ? ?Other Instructions ?  ? ?Important Information About Sugar ? ? ? ? ? ? ?

## 2022-02-14 ENCOUNTER — Encounter: Payer: Self-pay | Admitting: Surgery

## 2022-02-14 ENCOUNTER — Ambulatory Visit (INDEPENDENT_AMBULATORY_CARE_PROVIDER_SITE_OTHER): Payer: No Typology Code available for payment source | Admitting: Surgery

## 2022-02-14 VITALS — BP 141/104 | HR 110 | Temp 98.1°F | Resp 20 | Ht 62.0 in | Wt 179.0 lb

## 2022-02-14 DIAGNOSIS — I83893 Varicose veins of bilateral lower extremities with other complications: Secondary | ICD-10-CM | POA: Diagnosis not present

## 2022-02-14 NOTE — Progress Notes (Signed)
? ?[facility] ? ?Patient name: Robin Martinez MRN: [id number] DOB: 1951-01-15 Sex: female ? ? ?REASON FOR VISIT:  ? ? ?Follow-up ? ?HISOTRY OF PRESENT ILLNESS:  ? ? ?Robin Martinez is a 71 y.o. female who is a former patient of Dr. Oneida Alar who is status post laser ablation of the right saphenous vein in March 2020.  She did not get complete resolution of her symptoms with laser ablation and so stab phlebectomies were recommended.  This was performed in December 2020.  She has had long-term swelling in her legs but recently started experiencing pain over top of varicosities on the right lateral knee and medial calf.  She does wear compression socks at night but not during the day.  She has not had a prior history of DVT.  There is a family history of varicose veins in her mother. ? ? ?PAST MEDICAL HISTORY:  ? ?Past Medical History:  ?Diagnosis Date  ? Allergy   ? Arthritis   ? GERD (gastroesophageal reflux disease)   ? Hypertension   ? ? ? ?FAMILY HISTORY:  ? ?Family History  ?Problem Relation Age of Onset  ? Hypertension Mother   ? Dementia Mother   ? Stroke Father   ? Hypertension Father   ? Cancer Maternal Grandmother   ?     gyn ("vaginal")  ? Diabetes Maternal Grandfather   ? ? ?SOCIAL HISTORY:  ? ?Social History  ? ?Tobacco Use  ? Smoking status: Never  ? Smokeless tobacco: Never  ?Substance Use Topics  ? Alcohol use: No  ? ? ? ?ALLERGIES:  ? ?No Known Allergies ? ? ?CURRENT MEDICATIONS:  ? ?Current Outpatient Medications  ?Medication Sig Dispense Refill  ? triamterene-hydrochlorothiazide (MAXZIDE-25) 37.5-25 MG tablet Take 1 tablet by mouth daily. Appointment due 90 tablet 2  ? ?No current facility-administered medications for this visit.  ? ? ?REVIEW OF SYSTEMS:  ? ?[X]  denotes positive finding, [ ]  denotes negative finding ?Cardiac  Comments:  ?Chest pain or chest pressure:    ?Shortness of breath upon exertion:    ?Short of breath when lying flat:     ?Irregular heart rhythm:    ?    ?Vascular    ?Pain in calf, thigh, or hip brought on by ambulation:    ?Pain in feet at night that wakes you up from your sleep:     ?Blood clot in your veins:    ?Leg swelling:  x   ?    ?Pulmonary    ?Oxygen at home:    ?Productive cough:     ?Wheezing:     ?    ?Neurologic    ?Sudden weakness in arms or legs:     ?Sudden numbness in arms or legs:     ?Sudden onset of difficulty speaking or slurred speech:    ?Temporary loss of vision in one eye:     ?Problems with dizziness:     ?    ?Gastrointestinal    ?Blood in stool:     ?Vomited blood:     ?    ?Genitourinary    ?Burning when urinating:     ?Blood in urine:    ?    ?Psychiatric    ?Major depression:     ?    ?Hematologic    ?Bleeding problems:    ?Problems with blood clotting too easily:    ?    ?Skin    ?Rashes or ulcers:    ?    ?  Constitutional    ?Fever or chills:    ? ? ?PHYSICAL EXAM:  ? ?Vitals:  ? 02/14/22 1117  ?BP: (!) 141/104  ?Pulse: (!) 110  ?Resp: 20  ?Temp: 98.1 ?F (36.7 ?C)  ?SpO2: 97%  ?Weight: 179 lb (81.2 kg)  ?Height: 5\' 2"  (1.575 m)  ? ? ?GENERAL: The patient is a well-nourished female, in no acute distress. The vital signs are documented above. ?CARDIAC: There is a regular rate and rhythm.  ?VASCULAR: 1+ pitting edema to the left leg, trace edema to the right leg small cluster of varicosities on the left lateral knee and medial calf ?PULMONARY: Non-labored respirations ?MUSCULOSKELETAL: There are no major deformities or cyanosis. ?NEUROLOGIC: No focal weakness or paresthesias are detected. ?SKIN: See photo below ?PSYCHIATRIC: The patient has a normal affect. ? ? ?STUDIES:  ? ?none ? ?MEDICAL ISSUES:  ? ?CEAP class III, left leg: I discussed with the patient that the initial step is to begin wearing 20-30 thigh-high compression socks during the day and then taking them off at night.  I would like to see how her symptoms improved by doing this.  I am going to have her follow-up with me in 2 to 3 months  with a venous reflux study of the left leg so we can plan future options. ? ? ? ?Annamarie Major, IV, MD, FACS ?[facility] [place] [facility] ?Tel [phone] ?Pager [phone]  ?

## 2022-02-18 ENCOUNTER — Other Ambulatory Visit: Payer: Self-pay | Admitting: *Deleted

## 2022-02-18 DIAGNOSIS — I83893 Varicose veins of bilateral lower extremities with other complications: Secondary | ICD-10-CM

## 2022-02-22 ENCOUNTER — Ambulatory Visit (HOSPITAL_COMMUNITY): Payer: No Typology Code available for payment source | Attending: Internal Medicine

## 2022-02-22 ENCOUNTER — Ambulatory Visit: Admission: RE | Admit: 2022-02-22 | Payer: Self-pay | Source: Ambulatory Visit

## 2022-02-22 DIAGNOSIS — R0602 Shortness of breath: Secondary | ICD-10-CM | POA: Diagnosis not present

## 2022-02-22 LAB — ECHOCARDIOGRAM COMPLETE
Area-P 1/2: 5.02 cm2
S' Lateral: 3.8 cm

## 2022-02-23 ENCOUNTER — Encounter: Payer: Self-pay | Admitting: Interventional Cardiology

## 2022-02-23 ENCOUNTER — Telehealth (INDEPENDENT_AMBULATORY_CARE_PROVIDER_SITE_OTHER): Payer: No Typology Code available for payment source | Admitting: Interventional Cardiology

## 2022-02-23 ENCOUNTER — Telehealth: Payer: Self-pay | Admitting: *Deleted

## 2022-02-23 VITALS — BP 126/93 | HR 82 | Ht 62.0 in | Wt 178.0 lb

## 2022-02-23 DIAGNOSIS — I10 Essential (primary) hypertension: Secondary | ICD-10-CM

## 2022-02-23 DIAGNOSIS — I340 Nonrheumatic mitral (valve) insufficiency: Secondary | ICD-10-CM

## 2022-02-23 DIAGNOSIS — I519 Heart disease, unspecified: Secondary | ICD-10-CM

## 2022-02-23 DIAGNOSIS — R0602 Shortness of breath: Secondary | ICD-10-CM

## 2022-02-23 NOTE — Telephone Encounter (Signed)
Patient is to be scheduled for TEE and cath the same day.  First availability for TEE is June 1.  Per Dr Eldridge Dace cath can be scheduled with one of his partners on June 1 or he can do cath on June 5 with TEE the same day.  I spoke with patient and she would like to have procedures on June 1.  Central scheduling is now closed.  Will call later this week to schedule. It is OK to leave message on patient's voicemail.  I left message that I was unable to schedule procedures today and would call her back later this week.  ?

## 2022-02-23 NOTE — Progress Notes (Addendum)
Virtual Visit via Video Note   This visit type was conducted due to national recommendations for restrictions regarding the COVID-19 Pandemic (e.g. social distancing) in an effort to limit this patient's exposure and mitigate transmission in our community.  Due to her co-morbid illnesses, this patient is at least at moderate risk for complications without adequate follow up.  This format is felt to be most appropriate for this patient at this time.  All issues noted in this document were discussed and addressed.  A limited physical exam was performed with this format.  Please refer to the patient's chart for her consent to telehealth for [facility].       Date:  02/23/2022  ID:  Robin Martinez, DOB Oct 27, 1950, MRN [id number] The patient was identified using 2 identifiers.  Patient Location: Home Provider Location: Office/Clinic   PCP:  [place], Betty G, MD   [facility] Providers Cardiologist:  Lance Muss, MD     Evaluation Performed:  Follow-Up Visit  Chief Complaint: Mitral regurgitation/abnormal echocardiogram  History of Present Illness:    Robin Martinez is a 71 y.o. female who is being seen today for the evaluation of mitral regurgitation at the request of [place], Betty G, MD.   Her initial symptom was dyspnea on exertion.  Recent echocardiogram showed severe mitral regurgitation and mildly decreased LV function: "Left ventricular ejection fraction, by estimation, is 45 to 50%. Left  ventricular ejection fraction by PLAX is 45 %. The left ventricle has  mildly decreased function. The left ventricle demonstrates global  hypokinesis. There is mild left ventricular  hypertrophy. Left ventricular diastolic parameters are consistent with  Grade I diastolic dysfunction (impaired relaxation).   2. Right ventricular systolic function is normal. The right ventricular  size is normal. There is mildly elevated pulmonary artery systolic  pressure. The estimated right  ventricular systolic pressure is 42.9 mmHg.   3. Left atrial size was mildly dilated.   4. Type 1R Fixed atrial septal aneurysm.   5. 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 Severe mitral valve regurgitation.  There is mild late systolic prolapse of multiple segments of the anterior  leaflet of the mitral valve.   6. Tricuspid valve regurgitation is moderate to severe.   7. The aortic valve is tricuspid. Aortic valve regurgitation is not  visualized.   8. The inferior vena cava is normal in size with greater than 50%  respiratory variability, suggesting right atrial pressure of 3 mmHg. "  Sleeps on one pillow.   Denies : Chest pain. Dizziness. Leg edema. Nitroglycerin use. Orthopnea. Palpitations. Paroxysmal nocturnal dyspnea.  Syncope.    Shortness of breath is less frequent.   Past Medical History:  Diagnosis Date   Allergy    Arthritis    GERD (gastroesophageal reflux disease)    Hypertension    Past Surgical History:  Procedure Laterality Date   BREAST SURGERY     biopsy   ENDOVENOUS ABLATION SAPHENOUS VEIN W/ LASER Right 12/12/2018   endovenous laser ablation right greater saphenous vein by Fabienne Bruns MD   stab phlebectomy  Right 09/11/2019   stab phlebectomy > 20 incisions right leg by Fabienne Bruns MD      Current Meds  Medication Sig   triamterene-hydrochlorothiazide (MAXZIDE-25) 37.5-25 MG tablet Take 1 tablet by mouth daily. Appointment due     Allergies:   Patient has no known allergies.   Social History   Tobacco Use   Smoking status: Never   Smokeless tobacco: Never  Substance Use  Topics   Alcohol use: No   Drug use: No     Family Hx: The patient's family history includes Cancer in her maternal grandmother; Dementia in her mother; Diabetes in her maternal grandfather; Hypertension in her father and mother; Stroke in her father.  ROS:   Please see the history of present illness.     All other systems reviewed and are negative.   Prior CV studies:    The following studies were reviewed today:  Echo shows severe MR, mildly decreased LVEF  Labs/Other Tests and Data Reviewed:     Recent Labs: 11/15/2021: ALT 16; TSH 2.43 01/25/2022: BUN 19; Creatinine, Ser 0.93; Hemoglobin 14.3; Platelets 232.0; Potassium 4.0; Sodium 140   Recent Lipid Panel Lab Results  Component Value Date/Time   CHOL 198 11/15/2021 09:30 AM   TRIG 82.0 11/15/2021 09:30 AM   HDL 63.70 11/15/2021 09:30 AM   CHOLHDL 3 11/15/2021 09:30 AM   LDLCALC 117 (H) 11/15/2021 09:30 AM   LDLCALC 130 (H) 06/02/2020 10:24 AM    Wt Readings from Last 3 Encounters:  02/23/22 178 lb (80.7 kg)  02/14/22 179 lb (81.2 kg)  02/09/22 180 lb (81.6 kg)     Risk Assessment/Calculations:          Objective:    Vital Signs:  BP (!) 126/93 (BP Location: Right Arm, Patient Position: Sitting, Cuff Size: Normal)   Pulse 82   Ht 5\' 2"  (1.575 m)   Wt 178 lb (80.7 kg)   BMI 32.56 kg/m    VITAL SIGNS:  reviewed GEN:  no acute distress RESPIRATORY:  normal respiratory effort, symmetric expansion NEURO:  alert and oriented x 3, no obvious focal deficit PSYCH:  normal affect Exam limited by video format  ASSESSMENT & PLAN:    Mitral regurgitation: Need to better evaluate anatomy of mitral valve.  Plan for transesophageal echocardiogram. Hypertension: Low-salt diet. Mild LV dysfunction: Need to evaluate for coronary artery disease.  We will plan for right and left heart catheterization.  Based on work-up, will need to consider adding heart failure therapy. All questions answered.  SHe is heading to in June or a month.  Would like to wait for surgery in [place] until August if possible.      Shared Decision Making/Informed Consent The risks [stroke (1 in 1000), death (1 in 1000), kidney failure [usually temporary] (1 in 500), bleeding (1 in 200), allergic reaction [possibly serious] (1 in 200)], benefits (diagnostic support and management of coronary artery disease) and  alternatives of a cardiac catheterization were discussed in detail with Ms. Mazzocco and she is willing to proceed.   The risks [esophageal damage, perforation (1:10,000 risk), bleeding, pharyngeal hematoma as well as other potential complications associated with conscious sedation including aspiration, arrhythmia, respiratory failure and death], benefits (treatment guidance and diagnostic support) and alternatives of a transesophageal echocardiogram were discussed in detail with Ms. Mahmood and she is willing to proceed.      Time:   Today, I have spent 20 minutes with the patient with telehealth technology discussing the above problems.     Medication Adjustments/Labs and Tests Ordered: Current medicines are reviewed at length with the patient today.  Concerns regarding medicines are outlined above.   Tests Ordered: No orders of the defined types were placed in this encounter.   Medication Changes: No orders of the defined types were placed in this encounter.   Follow Up:  for tests  Signed, September, MD  02/23/2022 4:50 PM  [facility]

## 2022-02-23 NOTE — Telephone Encounter (Signed)
?  Patient Consent for Virtual Visit  ? ? ?   ? ?[name] has provided verbal consent on 02/23/2022 for a virtual visit (video or telephone). ? ? ?CONSENT FOR VIRTUAL VISIT FOR:  Robin Martinez  ?By participating in this virtual visit I agree to the following: ? ?I hereby voluntarily request, consent and authorize [facility] and its employed or contracted physicians, physician assistants, nurse practitioners or other licensed health care professionals (the Practitioner), to provide me with telemedicine health care services (the ?Services") as deemed necessary by the treating Practitioner. I acknowledge and consent to receive the Services by the Practitioner via telemedicine. I understand that the telemedicine visit will involve communicating with the Practitioner through live audiovisual communication technology and the disclosure of certain medical information by electronic transmission. I acknowledge that I have been given the opportunity to request an in-person assessment or other available alternative prior to the telemedicine visit and am voluntarily participating in the telemedicine visit. ? ?I understand that I have the right to withhold or withdraw my consent to the use of telemedicine in the course of my care at any time, without affecting my right to future care or treatment, and that the Practitioner or I may terminate the telemedicine visit at any time. I understand that I have the right to inspect all information obtained and/or recorded in the course of the telemedicine visit and may receive copies of available information for a reasonable fee.  I understand that some of the potential risks of receiving the Services via telemedicine include:  ?Delay or interruption in medical evaluation due to technological equipment failure or disruption; ?Information transmitted may not be sufficient (e.g. poor resolution of images) to allow for appropriate medical decision making by the Practitioner; and/or   ?In rare instances, security protocols could fail, causing a breach of personal health information. ? ?Furthermore, I acknowledge that it is my responsibility to provide information about my medical history, conditions and care that is complete and accurate to the best of my ability. I acknowledge that Practitioner's advice, recommendations, and/or decision may be based on factors not within their control, such as incomplete or inaccurate data provided by me or distortions of diagnostic images or specimens that may result from electronic transmissions. I understand that the practice of medicine is not an exact science and that Practitioner makes no warranties or guarantees regarding treatment outcomes. I acknowledge that a copy of this consent can be made available to me via my patient portal ([facility]), or I can request a printed copy by calling the office of [facility].   ? ?I understand that my insurance will be billed for this visit.  ? ?I have read or had this consent read to me. ?I understand the contents of this consent, which adequately explains the benefits and risks of the Services being provided via telemedicine.  ?I have been provided ample opportunity to ask questions regarding this consent and the Services and have had my questions answered to my satisfaction. ?I give my informed consent for the services to be provided through the use of telemedicine in my medical care ? ?  ?

## 2022-02-23 NOTE — H&P (View-Only) (Signed)
Virtual Visit via Video Note   This visit type was conducted due to national recommendations for restrictions regarding the COVID-19 Pandemic (e.g. social distancing) in an effort to limit this patient's exposure and mitigate transmission in our community.  Due to her co-morbid illnesses, this patient is at least at moderate risk for complications without adequate follow up.  This format is felt to be most appropriate for this patient at this time.  All issues noted in this document were discussed and addressed.  A limited physical exam was performed with this format.  Please refer to the patient's chart for her consent to telehealth for [facility].       Date:  02/23/2022  ID:  Robin Martinez, DOB Oct 27, 1950, MRN [id number] The patient was identified using 2 identifiers.  Patient Location: Home Provider Location: Office/Clinic   PCP:  [place], Betty G, MD   [facility] Providers Cardiologist:  Lance Muss, MD     Evaluation Performed:  Follow-Up Visit  Chief Complaint: Mitral regurgitation/abnormal echocardiogram  History of Present Illness:    Robin Martinez is a 71 y.o. female who is being seen today for the evaluation of mitral regurgitation at the request of [place], Betty G, MD.   Her initial symptom was dyspnea on exertion.  Recent echocardiogram showed severe mitral regurgitation and mildly decreased LV function: "Left ventricular ejection fraction, by estimation, is 45 to 50%. Left  ventricular ejection fraction by PLAX is 45 %. The left ventricle has  mildly decreased function. The left ventricle demonstrates global  hypokinesis. There is mild left ventricular  hypertrophy. Left ventricular diastolic parameters are consistent with  Grade I diastolic dysfunction (impaired relaxation).   2. Right ventricular systolic function is normal. The right ventricular  size is normal. There is mildly elevated pulmonary artery systolic  pressure. The estimated right  ventricular systolic pressure is 42.9 mmHg.   3. Left atrial size was mildly dilated.   4. Type 1R Fixed atrial septal aneurysm.   5. 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 Severe mitral valve regurgitation.  There is mild late systolic prolapse of multiple segments of the anterior  leaflet of the mitral valve.   6. Tricuspid valve regurgitation is moderate to severe.   7. The aortic valve is tricuspid. Aortic valve regurgitation is not  visualized.   8. The inferior vena cava is normal in size with greater than 50%  respiratory variability, suggesting right atrial pressure of 3 mmHg. "  Sleeps on one pillow.   Denies : Chest pain. Dizziness. Leg edema. Nitroglycerin use. Orthopnea. Palpitations. Paroxysmal nocturnal dyspnea.  Syncope.    Shortness of breath is less frequent.   Past Medical History:  Diagnosis Date   Allergy    Arthritis    GERD (gastroesophageal reflux disease)    Hypertension    Past Surgical History:  Procedure Laterality Date   BREAST SURGERY     biopsy   ENDOVENOUS ABLATION SAPHENOUS VEIN W/ LASER Right 12/12/2018   endovenous laser ablation right greater saphenous vein by Fabienne Bruns MD   stab phlebectomy  Right 09/11/2019   stab phlebectomy > 20 incisions right leg by Fabienne Bruns MD      Current Meds  Medication Sig   triamterene-hydrochlorothiazide (MAXZIDE-25) 37.5-25 MG tablet Take 1 tablet by mouth daily. Appointment due     Allergies:   Patient has no known allergies.   Social History   Tobacco Use   Smoking status: Never   Smokeless tobacco: Never  Substance Use  Topics   Alcohol use: No   Drug use: No     Family Hx: The patient's family history includes Cancer in her maternal grandmother; Dementia in her mother; Diabetes in her maternal grandfather; Hypertension in her father and mother; Stroke in her father.  ROS:   Please see the history of present illness.     All other systems reviewed and are negative.   Prior CV studies:    The following studies were reviewed today:  Echo shows severe MR, mildly decreased LVEF  Labs/Other Tests and Data Reviewed:     Recent Labs: 11/15/2021: ALT 16; TSH 2.43 01/25/2022: BUN 19; Creatinine, Ser 0.93; Hemoglobin 14.3; Platelets 232.0; Potassium 4.0; Sodium 140   Recent Lipid Panel Lab Results  Component Value Date/Time   CHOL 198 11/15/2021 09:30 AM   TRIG 82.0 11/15/2021 09:30 AM   HDL 63.70 11/15/2021 09:30 AM   CHOLHDL 3 11/15/2021 09:30 AM   LDLCALC 117 (H) 11/15/2021 09:30 AM   LDLCALC 130 (H) 06/02/2020 10:24 AM    Wt Readings from Last 3 Encounters:  02/23/22 178 lb (80.7 kg)  02/14/22 179 lb (81.2 kg)  02/09/22 180 lb (81.6 kg)     Risk Assessment/Calculations:          Objective:    Vital Signs:  BP (!) 126/93 (BP Location: Right Arm, Patient Position: Sitting, Cuff Size: Normal)   Pulse 82   Ht 5\' 2"  (1.575 m)   Wt 178 lb (80.7 kg)   BMI 32.56 kg/m    VITAL SIGNS:  reviewed GEN:  no acute distress RESPIRATORY:  normal respiratory effort, symmetric expansion NEURO:  alert and oriented x 3, no obvious focal deficit PSYCH:  normal affect Exam limited by video format  ASSESSMENT & PLAN:    Mitral regurgitation: Need to better evaluate anatomy of mitral valve.  Plan for transesophageal echocardiogram. Hypertension: Low-salt diet. Mild LV dysfunction: Need to evaluate for coronary artery disease.  We will plan for right and left heart catheterization.  Based on work-up, will need to consider adding heart failure therapy. All questions answered.  SHe is heading to in June or a month.  Would like to wait for surgery in [place] until August if possible.      Shared Decision Making/Informed Consent The risks [stroke (1 in 1000), death (1 in 1000), kidney failure [usually temporary] (1 in 500), bleeding (1 in 200), allergic reaction [possibly serious] (1 in 200)], benefits (diagnostic support and management of coronary artery disease) and  alternatives of a cardiac catheterization were discussed in detail with [name] and she is willing to proceed.   The risks [esophageal damage, perforation (1:10,000 risk), bleeding, pharyngeal hematoma as well as other potential complications associated with conscious sedation including aspiration, arrhythmia, respiratory failure and death], benefits (treatment guidance and diagnostic support) and alternatives of a transesophageal echocardiogram were discussed in detail with Ms. Cruzan and she is willing to proceed.      Time:   Today, I have spent 20 minutes with the patient with telehealth technology discussing the above problems.     Medication Adjustments/Labs and Tests Ordered: Current medicines are reviewed at length with the patient today.  Concerns regarding medicines are outlined above.   Tests Ordered: No orders of the defined types were placed in this encounter.   Medication Changes: No orders of the defined types were placed in this encounter.   Follow Up:  for tests  Signed, September, MD  02/23/2022 4:50 PM  [facility]

## 2022-02-23 NOTE — Patient Instructions (Signed)
Medication Instructions:  ?Your physician recommends that you continue on your current medications as directed. Please refer to the Current Medication list given to you today. ? ?*If you need a refill on your cardiac medications before your next appointment, please call your pharmacy* ? ? ?Lab Work: ?None--We will call you to schedule ?If you have labs (blood work) drawn today and your tests are completely normal, you will receive your results only by: ?MyChart Message (if you have MyChart) OR ?A paper copy in the mail ?If you have any lab test that is abnormal or we need to change your treatment, we will call you to review the results. ? ? ?Testing/Procedures: ?Your physician has requested that you have a TEE. During a TEE, sound waves are used to create images of your heart. It provides your doctor with information about the size and shape of your heart and how well your heart?s chambers and valves are working. In this test, a transducer is attached to the end of a flexible tube that?s guided down your throat and into your esophagus (the tube leading from you mouth to your stomach) to get a more detailed image of your heart. You are not awake for the procedure. Please see the instruction sheet given to you today. For further information please visit [email or web address] ? ? ?Your physician has requested that you have a cardiac catheterization. Cardiac catheterization is used to diagnose and/or treat various heart conditions. Doctors may recommend this procedure for a number of different reasons. The most common reason is to evaluate chest pain. Chest pain can be a symptom of coronary artery disease (CAD), and cardiac catheterization can show whether plaque is narrowing or blocking your heart?s arteries. This procedure is also used to evaluate the valves, as well as measure the blood flow and oxygen levels in different parts of your heart. For further information please visit [email or web address] Please follow  instruction sheet, as given. ? ? ? ?Follow-Up: ?At [facility], you and your health needs are our priority.  As part of our continuing mission to provide you with exceptional heart care, we have created designated Provider Care Teams.  These Care Teams include your primary Cardiologist (physician) and Advanced Practice Providers (APPs -  Physician Assistants and Nurse Practitioners) who all work together to provide you with the care you need, when you need it. ? ?We recommend signing up for the patient portal called "MyChart".  Sign up information is provided on this After Visit Summary.  MyChart is used to connect with patients for Virtual Visits (Telemedicine).  Patients are able to view lab/test results, encounter notes, upcoming appointments, etc.  Non-urgent messages can be sent to your provider as well.   ?To learn more about what you can do with MyChart, go to [email or web address].   ? ?Your next appointment:   ?Based on result ? ?The format for your next appointment:   ?In Person ? ?Provider:   ?Lance Muss, MD   ? ? ?Other Instructions ?We will call you to schedule TEE and catheterization ? ?Important Information About Sugar ? ? ? ? ? ? ?

## 2022-02-24 ENCOUNTER — Other Ambulatory Visit: Payer: Self-pay | Admitting: Interventional Cardiology

## 2022-02-24 DIAGNOSIS — I340 Nonrheumatic mitral (valve) insufficiency: Secondary | ICD-10-CM

## 2022-02-24 NOTE — Addendum Note (Signed)
Addended by: Drue Novel I on: 02/24/2022 12:02 PM   Modules accepted: Orders

## 2022-02-24 NOTE — Telephone Encounter (Signed)
Patient scheduled for TEE with Dr. Carolan Clines on 6/1 at 7:30 AM and R&LHC with Dr. Clifton James on 6/1 at 9:00 AM. Called and made patient aware. Scheduled EKG Nurse Visit as well as lab appointment on 5/30. Reviewed all instructions below with the patient, who verbalized understanding and thanked me for the call. Message sent to pre-cert.     [facility] CARDIOVASCULAR DIVISION [facility] ST OFFICE [address] [place] [address] Dept: [phone] Loc: [phone]  Robin Martinez  On March 10, 2022: Please arrive at the [facility] (Main Entrance A) at [facility]: [address] [place], [place] [address] at 6:30 am (1 hour prior to procedure).  You are scheduled for a TEE on 03/10/22 with Dr. Carolan Clines at 7:30 AM.  Robin Martinez are scheduled for a Right and Left Cardiac Catheterization on Thursday, June 1 with Dr. Verne Carrow at 9:00 AM.    2. DIET: Nothing to eat or drink after midnight except a sip of water with medications (see medication instructions below)   3. Labs/EKG: You are scheduled for an EKG Nurse Visit on 03/08/22 at 2:00 PM and a Lab appointment on 03/08/22 at 2:15 PM to draw a CBC and BMET.  4. Medication instructions in preparation for your procedure:   Contrast Allergy: No  DO NOT take your triamterene-hydrochlorothiazide (MAXZIDE-25) the morning of your procedure.   On the morning of your procedure, take a baby Aspirin 81 mg and any morning medicines NOT listed above.  You may use sips of water.  5. Plan to go home the same day, you will only stay overnight if medically necessary. 6. You MUST have a responsible adult to drive you home. 7. An adult MUST be with you the first 24 hours after you arrive home. 8. Bring a current list of your medications, and the last time and date medication taken. 9. Bring ID and current insurance cards. 10.Please wear clothes that are easy to get on and off and wear  slip-on shoes.    *Special Note: Every effort is made to have your procedure done on time. Occasionally there are emergencies that occur at the hospital that may cause delays. Please be patient if a delay does occur.   FYI: For your safety, and to allow [place] to monitor your vital signs accurately during the surgery/procedure we request that   if you have artificial nails, gel coating, SNS etc. Please have those removed prior to your surgery/procedure. Not having the nail coverings /polish removed may result in cancellation or delay of your surgery/procedure.  Thank you for allowing [place] to care for you!   -- [facility]

## 2022-03-01 ENCOUNTER — Encounter (HOSPITAL_COMMUNITY): Payer: Self-pay | Admitting: Internal Medicine

## 2022-03-02 NOTE — Telephone Encounter (Signed)
Disregard

## 2022-03-08 ENCOUNTER — Other Ambulatory Visit: Admitting: *Deleted

## 2022-03-08 ENCOUNTER — Ambulatory Visit (INDEPENDENT_AMBULATORY_CARE_PROVIDER_SITE_OTHER): Payer: No Typology Code available for payment source

## 2022-03-08 VITALS — BP 138/108 | Wt 177.0 lb

## 2022-03-08 DIAGNOSIS — I519 Heart disease, unspecified: Secondary | ICD-10-CM | POA: Diagnosis not present

## 2022-03-08 DIAGNOSIS — I340 Nonrheumatic mitral (valve) insufficiency: Secondary | ICD-10-CM

## 2022-03-08 DIAGNOSIS — Z01818 Encounter for other preprocedural examination: Secondary | ICD-10-CM

## 2022-03-08 NOTE — Progress Notes (Unsigned)
   Nurse Visit   Date of Encounter: 03/08/2022 ID: Paizleigh Wilds, DOB April 11, 1951, MRN [id number]  PCP:  [place], Betty G, MD   [facility] Providers Cardiologist:  Lance Muss, MD      Visit Details   VS:  BP (!) 138/108   Wt 177 lb (80.3 kg)   BMI 32.37 kg/m  , BMI Body mass index is 32.37 kg/m.  Wt Readings from Last 3 Encounters:  03/08/22 177 lb (80.3 kg)  02/23/22 178 lb (80.7 kg)  02/14/22 179 lb (81.2 kg)     Reason for visit: EKG -pre procedure Performed today: EKG, Vitals, Dr. Lalla Brothers consulted. Changes (medications, testing, etc.) : no changes  Length of Visit: 10 minutes    Medications Adjustments/Labs and Tests Ordered: Orders Placed This Encounter  Procedures   EKG 12-Lead   No orders of the defined types were placed in this encounter.    Signed, Eilleen Kempf, RN  03/08/2022 2:48 PM

## 2022-03-09 LAB — BASIC METABOLIC PANEL
BUN/Creatinine Ratio: 18 (ref 12–28)
BUN: 17 mg/dL (ref 8–27)
CO2: 27 mmol/L (ref 20–29)
Calcium: 9.3 mg/dL (ref 8.7–10.3)
Chloride: 104 mmol/L (ref 96–106)
Creatinine, Ser: 0.92 mg/dL (ref 0.57–1.00)
Glucose: 89 mg/dL (ref 70–99)
Potassium: 4 mmol/L (ref 3.5–5.2)
Sodium: 143 mmol/L (ref 134–144)
eGFR: 68 mL/min/{1.73_m2} (ref >59)

## 2022-03-09 LAB — CBC
Hematocrit: 41.4 % (ref 34.0–46.6)
Hemoglobin: 14 g/dL (ref 11.1–15.9)
MCH: 29.3 pg (ref 26.6–33.0)
MCHC: 33.8 g/dL (ref 31.5–35.7)
MCV: 87 fL (ref 79–97)
Platelets: 241 10*3/uL (ref 150–450)
RBC: 4.78 x10E6/uL (ref 3.77–5.28)
RDW: 12.1 % (ref 11.7–15.4)
WBC: 4.6 10*3/uL (ref 3.4–10.8)

## 2022-03-09 NOTE — Anesthesia Preprocedure Evaluation (Signed)
Anesthesia Evaluation  Patient identified by MRN, date of birth, ID band Patient awake    Reviewed: Allergy & Precautions, NPO status , Patient's Chart, lab work & pertinent test results  Airway Mallampati: II  TM Distance: >3 FB Neck ROM: Full    Dental no notable dental hx. (+) Teeth Intact, Dental Advisory Given   Pulmonary neg pulmonary ROS,    Pulmonary exam normal breath sounds clear to auscultation       Cardiovascular hypertension, Normal cardiovascular exam+ Valvular Problems/Murmurs MR  Rhythm:Regular Rate:Normal  TTE 2023 1. Left ventricular ejection fraction, by estimation, is 45 to 50%. Left  ventricular ejection fraction by PLAX is 45 %. The left ventricle has  mildly decreased function. The left ventricle demonstrates global  hypokinesis. There is mild left ventricular  hypertrophy. Left ventricular diastolic parameters are consistent with  Grade I diastolic dysfunction (impaired relaxation).  2. Right ventricular systolic function is normal. The right ventricular  size is normal. There is mildly elevated pulmonary artery systolic  pressure. The estimated right ventricular systolic pressure is 80.2 mmHg.  3. Left atrial size was mildly dilated.  4. Type 1R Fixed atrial septal aneurysm.  5. 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 Severe mitral valve regurgitation.  There is mild late systolic prolapse of multiple segments of the anterior  leaflet of the mitral valve.  6. Tricuspid valve regurgitation is moderate to severe.  7. The aortic valve is tricuspid. Aortic valve regurgitation is not  visualized.  8. The inferior vena cava is normal in size with greater than 50%  respiratory variability, suggesting right atrial pressure of 3 mmHg.    Neuro/Psych negative neurological ROS  negative psych ROS   GI/Hepatic Neg liver ROS, GERD  ,  Endo/Other  negative endocrine ROS  Renal/GU negative Renal ROS  negative  genitourinary   Musculoskeletal  (+) Arthritis ,   Abdominal   Peds  Hematology negative hematology ROS (+)   Anesthesia Other Findings   Reproductive/Obstetrics                            Anesthesia Physical Anesthesia Plan  ASA: 3  Anesthesia Plan: MAC   Post-op Pain Management:    Induction: Intravenous  PONV Risk Score and Plan: Propofol infusion and Treatment may vary due to age or medical condition  Airway Management Planned: Natural Airway  Additional Equipment:   Intra-op Plan:   Post-operative Plan:   Informed Consent: I have reviewed the patients History and Physical, chart, labs and discussed the procedure including the risks, benefits and alternatives for the proposed anesthesia with the patient or authorized representative who has indicated his/her understanding and acceptance.     Dental advisory given  Plan Discussed with: CRNA  Anesthesia Plan Comments:         Anesthesia Quick Evaluation

## 2022-03-10 ENCOUNTER — Ambulatory Visit (HOSPITAL_BASED_OUTPATIENT_CLINIC_OR_DEPARTMENT_OTHER): Payer: No Typology Code available for payment source | Admitting: Anesthesiology

## 2022-03-10 ENCOUNTER — Ambulatory Visit: Payer: Self-pay | Admitting: *Deleted

## 2022-03-10 ENCOUNTER — Ambulatory Visit (HOSPITAL_COMMUNITY)
Admission: RE | Admit: 2022-03-10 | Discharge: 2022-03-10 | Disposition: A | Discharge status: Home / Self Care | Payer: No Typology Code available for payment source | Attending: Internal Medicine | Admitting: Internal Medicine

## 2022-03-10 ENCOUNTER — Ambulatory Visit (HOSPITAL_COMMUNITY): Payer: No Typology Code available for payment source | Admitting: Anesthesiology

## 2022-03-10 ENCOUNTER — Other Ambulatory Visit: Payer: Self-pay

## 2022-03-10 ENCOUNTER — Encounter (HOSPITAL_COMMUNITY): Payer: Self-pay | Admitting: Internal Medicine

## 2022-03-10 ENCOUNTER — Encounter (HOSPITAL_COMMUNITY)
Admission: RE | Disposition: A | Discharge status: Home / Self Care | Payer: Self-pay | Source: Home / Self Care | Attending: Internal Medicine

## 2022-03-10 ENCOUNTER — Ambulatory Visit (HOSPITAL_BASED_OUTPATIENT_CLINIC_OR_DEPARTMENT_OTHER)
Admission: RE | Admit: 2022-03-10 | Discharge: 2022-03-10 | Disposition: A | Discharge status: Home / Self Care | Payer: No Typology Code available for payment source | Source: Home / Self Care | Attending: Interventional Cardiology | Admitting: Interventional Cardiology

## 2022-03-10 DIAGNOSIS — I340 Nonrheumatic mitral (valve) insufficiency: Secondary | ICD-10-CM | POA: Diagnosis not present

## 2022-03-10 DIAGNOSIS — M1990 Unspecified osteoarthritis, unspecified site: Secondary | ICD-10-CM | POA: Diagnosis not present

## 2022-03-10 DIAGNOSIS — I10 Essential (primary) hypertension: Secondary | ICD-10-CM | POA: Diagnosis not present

## 2022-03-10 DIAGNOSIS — Q2112 Patent foramen ovale: Secondary | ICD-10-CM | POA: Insufficient documentation

## 2022-03-10 DIAGNOSIS — R0609 Other forms of dyspnea: Secondary | ICD-10-CM | POA: Diagnosis not present

## 2022-03-10 DIAGNOSIS — I2510 Atherosclerotic heart disease of native coronary artery without angina pectoris: Secondary | ICD-10-CM | POA: Diagnosis not present

## 2022-03-10 HISTORY — PX: TEE WITHOUT CARDIOVERSION: SHX5443

## 2022-03-10 HISTORY — PX: RIGHT/LEFT HEART CATH AND CORONARY ANGIOGRAPHY: CATH118266

## 2022-03-10 LAB — ECHO TEE
Area-P 1/2: 3.06 cm2
MV M vel: 4.7 m/s
MV Peak grad: 88.4 mmHg
Radius: 0.5 cm

## 2022-03-10 SURGERY — ECHOCARDIOGRAM, TRANSESOPHAGEAL
Anesthesia: Monitor Anesthesia Care

## 2022-03-10 SURGERY — RIGHT/LEFT HEART CATH AND CORONARY ANGIOGRAPHY
Anesthesia: LOCAL

## 2022-03-10 MED ORDER — ASPIRIN 81 MG PO CHEW: 81.0000 mg | CHEWABLE_TABLET | ORAL | Status: DC

## 2022-03-10 MED ORDER — LIDOCAINE HCL (PF) 1 % IJ SOLN
INTRAMUSCULAR | Status: AC
  Filled 2022-03-10: qty 30

## 2022-03-10 MED ORDER — SODIUM CHLORIDE 0.9 % IV SOLN: 250.0000 mL | INTRAVENOUS | Status: DC | PRN

## 2022-03-10 MED ORDER — SODIUM CHLORIDE 0.9% FLUSH: 3.0000 mL | Freq: Two times a day (BID) | INTRAVENOUS | Status: DC

## 2022-03-10 MED ORDER — PROPOFOL 500 MG/50ML IV EMUL
INTRAVENOUS | Status: DC | PRN
  Administered 2022-03-10: 175 ug/kg/min via INTRAVENOUS

## 2022-03-10 MED ORDER — VERAPAMIL HCL 2.5 MG/ML IV SOLN
INTRAVENOUS | Status: DC | PRN
  Administered 2022-03-10: 10 mL via INTRA_ARTERIAL

## 2022-03-10 MED ORDER — ONDANSETRON HCL 4 MG/2ML IJ SOLN: 4.0000 mg | Freq: Four times a day (QID) | INTRAMUSCULAR | Status: DC | PRN

## 2022-03-10 MED ORDER — ESMOLOL HCL 100 MG/10ML IV SOLN
INTRAVENOUS | Status: DC | PRN
  Administered 2022-03-10: 20 mg via INTRAVENOUS

## 2022-03-10 MED ORDER — VERAPAMIL HCL 2.5 MG/ML IV SOLN
INTRAVENOUS | Status: AC
  Filled 2022-03-10: qty 2

## 2022-03-10 MED ORDER — FENTANYL CITRATE (PF) 100 MCG/2ML IJ SOLN
INTRAMUSCULAR | Status: AC
  Filled 2022-03-10: qty 2

## 2022-03-10 MED ORDER — ACETAMINOPHEN 325 MG PO TABS: 650.0000 mg | ORAL_TABLET | ORAL | Status: DC | PRN

## 2022-03-10 MED ORDER — DEXMEDETOMIDINE (PRECEDEX) IN NS 20 MCG/5ML (4 MCG/ML) IV SYRINGE
PREFILLED_SYRINGE | INTRAVENOUS | Status: DC | PRN
  Administered 2022-03-10 (×2): 8 ug via INTRAVENOUS
  Administered 2022-03-10: 4 ug via INTRAVENOUS

## 2022-03-10 MED ORDER — LIDOCAINE HCL (PF) 1 % IJ SOLN
INTRAMUSCULAR | Status: DC | PRN
  Administered 2022-03-10 (×2): 2 mL

## 2022-03-10 MED ORDER — LABETALOL HCL 5 MG/ML IV SOLN: 10.0000 mg | INTRAVENOUS | Status: DC | PRN

## 2022-03-10 MED ORDER — FENTANYL CITRATE (PF) 100 MCG/2ML IJ SOLN
INTRAMUSCULAR | Status: DC | PRN
Start: 2022-03-10 — End: 2022-03-10
  Administered 2022-03-10: 25 ug via INTRAVENOUS

## 2022-03-10 MED ORDER — HYDRALAZINE HCL 20 MG/ML IJ SOLN: 10.0000 mg | INTRAMUSCULAR | Status: DC | PRN

## 2022-03-10 MED ORDER — SODIUM CHLORIDE 0.9 % IV SOLN: INTRAVENOUS | Status: AC

## 2022-03-10 MED ORDER — HEPARIN SODIUM (PORCINE) 1000 UNIT/ML IJ SOLN
INTRAMUSCULAR | Status: AC
  Filled 2022-03-10: qty 10

## 2022-03-10 MED ORDER — PROPOFOL 10 MG/ML IV BOLUS
INTRAVENOUS | Status: DC | PRN
  Administered 2022-03-10: 10 mg via INTRAVENOUS
  Administered 2022-03-10 (×2): 20 mg via INTRAVENOUS

## 2022-03-10 MED ORDER — IOHEXOL 350 MG/ML SOLN
INTRAVENOUS | Status: DC | PRN
  Administered 2022-03-10: 45 mL

## 2022-03-10 MED ORDER — SODIUM CHLORIDE 0.9% FLUSH: 3.0000 mL | INTRAVENOUS | Status: DC | PRN

## 2022-03-10 MED ORDER — MIDAZOLAM HCL 2 MG/2ML IJ SOLN
INTRAMUSCULAR | Status: AC
  Filled 2022-03-10: qty 2

## 2022-03-10 MED ORDER — HEPARIN SODIUM (PORCINE) 1000 UNIT/ML IJ SOLN
INTRAMUSCULAR | Status: DC | PRN
  Administered 2022-03-10: 4000 [IU] via INTRAVENOUS

## 2022-03-10 MED ORDER — SODIUM CHLORIDE 0.9 % IV SOLN: INTRAVENOUS | Status: DC

## 2022-03-10 MED ORDER — HEPARIN (PORCINE) IN NACL 1000-0.9 UT/500ML-% IV SOLN
INTRAVENOUS | Status: DC | PRN
  Administered 2022-03-10 (×2): 500 mL

## 2022-03-10 MED ORDER — HEPARIN (PORCINE) IN NACL 1000-0.9 UT/500ML-% IV SOLN
INTRAVENOUS | Status: AC
  Filled 2022-03-10: qty 1000

## 2022-03-10 MED ORDER — MIDAZOLAM HCL 2 MG/2ML IJ SOLN
INTRAMUSCULAR | Status: DC | PRN
  Administered 2022-03-10: 1 mg via INTRAVENOUS

## 2022-03-10 SURGICAL SUPPLY — 13 items
BAND ZEPHYR COMPRESS 30 LONG (HEMOSTASIS) ×1 IMPLANT
CATH 5FR JL3.5 JR4 ANG PIG MP (CATHETERS) ×1 IMPLANT
CATH BALLN WEDGE 5F 110CM (CATHETERS) ×1 IMPLANT
GLIDESHEATH SLEND SS 6F .021 (SHEATH) ×1 IMPLANT
GUIDEWIRE .025 260CM (WIRE) ×1 IMPLANT
GUIDEWIRE INQWIRE 1.5J.035X260 (WIRE) IMPLANT
INQWIRE 1.5J .035X260CM (WIRE) ×2
KIT HEART LEFT (KITS) ×2 IMPLANT
PACK CARDIAC CATHETERIZATION (CUSTOM PROCEDURE TRAY) ×2 IMPLANT
SHEATH 6FR 85 DEST SLENDER (SHEATH) ×1 IMPLANT
SHEATH GLIDE SLENDER 4/5FR (SHEATH) ×1 IMPLANT
TRANSDUCER W/STOPCOCK (MISCELLANEOUS) ×2 IMPLANT
TUBING CIL FLEX 10 FLL-RA (TUBING) ×2 IMPLANT

## 2022-03-10 NOTE — Interval H&P Note (Signed)
History and Physical Interval Note:  03/10/2022 9:03 AM  Robin Martinez  has presented today for surgery, with the diagnosis of mild lv dysfunction.  The various methods of treatment have been discussed with the patient and family. After consideration of risks, benefits and other options for treatment, the patient has consented to  Procedure(s): RIGHT/LEFT HEART CATH AND CORONARY ANGIOGRAPHY (N/A) as a surgical intervention.  The patient's history has been reviewed, patient examined, no change in status, stable for surgery.  I have reviewed the patient's chart and labs.  Questions were answered to the patient's satisfaction.    Cath Lab Visit (complete for each Cath Lab visit)  Clinical Evaluation Leading to the Procedure:   ACS: No.  Non-ACS:    Anginal Classification: CCS II  Anti-ischemic medical therapy: No Therapy  Non-Invasive Test Results: No non-invasive testing performed  Prior CABG: No previous CABG        [name]

## 2022-03-10 NOTE — CV Procedure (Addendum)
INDICATIONS: Mitral regurgitation  PROCEDURE:   Informed consent was obtained prior to the procedure. The risks, benefits and alternatives for the procedure were discussed and the patient comprehended these risks.  Risks include, but are not limited to, cough, sore throat, vomiting, nausea, somnolence, esophageal and stomach trauma or perforation, bleeding, low blood pressure, aspiration, pneumonia, infection, trauma to the teeth and death.    After a procedural time-out, the oropharynx was anesthetized with 20% benzocaine spray.   During this procedure the patient was administered propofol and precedex to achieve and maintain moderate conscious sedation.  The patient's heart rate, blood pressure, and oxygen saturationweare monitored continuously during the procedure. The period of conscious sedation was 20 minutes, of which I was present face-to-face 100% of this time.  The transesophageal probe was inserted in the esophagus and stomach without difficulty and multiple views were obtained.  The patient was kept under observation until the patient left the procedure room.  The patient left the procedure room in stable condition.   Agitated microbubble saline contrast was not administered.  COMPLICATIONS:    There were no immediate complications.  FINDINGS:  Mildly reduced EF Mild-Moderate functional MR Small PFO  RECOMMENDATIONS:    Consider antihypertensive medical management  Time Spent Directly with the Patient:  20 minutes   Robin Martinez 03/10/2022, 8:37 AM

## 2022-03-10 NOTE — Telephone Encounter (Signed)
Second attempt to reach pt. "Call cannot be completed at this time."

## 2022-03-10 NOTE — Progress Notes (Signed)
  Echocardiogram Echocardiogram Transesophageal has been performed.  Fidel Levy 03/10/2022, 8:52 AM

## 2022-03-10 NOTE — Interval H&P Note (Signed)
History and Physical Interval Note:  03/10/2022 7:22 AM  Robin Martinez  has presented today for surgery, with the diagnosis of MITRO REGURGITATION.  The various methods of treatment have been discussed with the patient and family. After consideration of risks, benefits and other options for treatment, the patient has consented to  Procedure(s): TRANSESOPHAGEAL ECHOCARDIOGRAM (TEE) (N/A) as a surgical intervention.  The patient's history has been reviewed, patient examined, no change in status, stable for surgery.  I have reviewed the patient's chart and labs.  Questions were answered to the patient's satisfaction.     Maisie Fus

## 2022-03-10 NOTE — Progress Notes (Signed)
Patient and husband was given discharge instructions. Both verbalized understanding. 

## 2022-03-10 NOTE — Telephone Encounter (Signed)
Summary: pt had procedure today and states does not know what to take for pain (husband)   Pt husbands states she had surgery today and was not given anything at all for pain and wants to know what to do. Mr Stapleton reach at  [phone]

## 2022-03-10 NOTE — Anesthesia Postprocedure Evaluation (Signed)
Anesthesia Post Note  Patient: Terianne Thaker  Procedure(s) Performed: TRANSESOPHAGEAL ECHOCARDIOGRAM (TEE)     Patient location during evaluation: Endoscopy Anesthesia Type: MAC Level of consciousness: awake and alert Pain management: pain level controlled Vital Signs Assessment: post-procedure vital signs reviewed and stable Respiratory status: spontaneous breathing, nonlabored ventilation, respiratory function stable and patient connected to nasal cannula oxygen Cardiovascular status: blood pressure returned to baseline and stable Postop Assessment: no apparent nausea or vomiting Anesthetic complications: no   No notable events documented.  Last Vitals:  Vitals:   03/10/22 0857 03/10/22 0903  BP: 100/61 102/68  Pulse: 64 63  Resp: (!) 26 20  Temp:    SpO2: 96% 98%    Last Pain:  Vitals:   03/10/22 0903  TempSrc:   PainSc: 0-No pain                 Andras Grunewald L Merilyn Pagan

## 2022-03-10 NOTE — Discharge Instructions (Addendum)
TEE  YOU HAD AN CARDIAC PROCEDURE TODAY: Refer to the procedure report and other information in the discharge instructions given to you for any specific questions about what was found during the examination. If this information does not answer your questions, please call [facility] office at [phone] to clarify.   DIET: Your first meal following the procedure should be a light meal and then it is ok to progress to your normal diet. A half-sandwich or bowl of soup is an example of a good first meal. Heavy or fried foods are harder to digest and may make you feel nauseous or bloated. Drink plenty of fluids but you should avoid alcoholic beverages for 24 hours. If you had a esophageal dilation, please see attached instructions for diet.   ACTIVITY: Your care partner should take you home directly after the procedure. You should plan to take it easy, moving slowly for the rest of the day. You can resume normal activity the day after the procedure however YOU SHOULD NOT DRIVE, use power tools, machinery or perform tasks that involve climbing or major physical exertion for 24 hours (because of the sedation medicines used during the test).   SYMPTOMS TO REPORT IMMEDIATELY: A cardiologist can be reached at any hour. Please call [phone] for any of the following symptoms:  Vomiting of blood or coffee ground material  New, significant abdominal pain  New, significant chest pain or pain under the shoulder blades  Painful or persistently difficult swallowing  New shortness of breath  Black, tarry-looking or red, bloody stools  FOLLOW UP:  Please also call with any specific questions about appointments or follow up tests.    Drink plenty of fluid for 48 hours and keep wrist elevated at heart level for 24 hours  Radial Site Care   This sheet gives you information about how to care for yourself after your procedure. Your health care provider may also give you more specific instructions. If you  have problems or questions, contact your health care provider. What can I expect after the procedure? After the procedure, it is common to have: Bruising and tenderness at the catheter insertion area. Follow these instructions at home: Medicines Take over-the-counter and prescription medicines only as told by your health care provider. Insertion site care Follow instructions from your health care provider about how to take care of your insertion site. Make sure you: Wash your hands with soap and water before you change your bandage (dressing). If soap and water are not available, use hand sanitizer. remove your dressing as told by your health care provider. In 24 hours Check your insertion site every day for signs of infection. Check for: Redness, swelling, or pain. Fluid or blood. Pus or a bad smell. Warmth. Do not take baths, swim, or use a hot tub until your health care provider approves. You may shower 24-48 hours after the procedure, or as directed by your health care provider. Remove the dressing and gently wash the site with plain soap and water. Pat the area dry with a clean towel. Do not rub the site. That could cause bleeding. Do not apply powder or lotion to the site. Activity   For 24 hours after the procedure, or as directed by your health care provider: Do not flex or bend the affected arm. Do not push or pull heavy objects with the affected arm. Do not drive yourself home from the hospital or clinic. You may drive 24 hours after the procedure unless your health care  provider tells you not to. Do not operate machinery or power tools. Do not lift anything that is heavier than 10 lb (4.5 kg), or the limit that you are told, until your health care provider says that it is safe. For 4 days Ask your health care provider when it is okay to: Return to work or school. Resume usual physical activities or sports. Resume sexual activity. General instructions If the catheter  site starts to bleed, raise your arm and put firm pressure on the site. If the bleeding does not stop, get help right away. This is a medical emergency. If you went home on the same day as your procedure, a responsible adult should be with you for the first 24 hours after you arrive home. Keep all follow-up visits as told by your health care provider. This is important. Contact a health care provider if: You have a fever. You have redness, swelling, or yellow drainage around your insertion site. Get help right away if: You have unusual pain at the radial site. The catheter insertion area swells very fast. The insertion area is bleeding, and the bleeding does not stop when you hold steady pressure on the area. Your arm or hand becomes pale, cool, tingly, or numb. These symptoms may represent a serious problem that is an emergency. Do not wait to see if the symptoms will go away. Get medical help right away. Call your local emergency services (911 in the [place] Do not drive yourself to the hospital. Summary After the procedure, it is common to have bruising and tenderness at the site. Follow instructions from your health care provider about how to take care of your radial site wound. Check the wound every day for signs of infection. Do not lift anything that is heavier than 10 lb (4.5 kg), or the limit that you are told, until your health care provider says that it is safe. This information is not intended to replace advice given to you by your health care provider. Make sure you discuss any questions you have with your health care provider. Document Revised: 11/01/2017 Document Reviewed: 11/01/2017 Elsevier Patient Education  2020 [employer].

## 2022-03-10 NOTE — Transfer of Care (Signed)
Immediate Anesthesia Transfer of Care Note  Patient: Robin Martinez  Procedure(s) Performed: TRANSESOPHAGEAL ECHOCARDIOGRAM (TEE)  Patient Location: Endoscopy Unit  Anesthesia Type:MAC  Level of Consciousness: awake, drowsy and patient cooperative  Airway & Oxygen Therapy: Patient Spontanous Breathing and Patient connected to nasal cannula oxygen  Post-op Assessment: Report given to RN, Post -op Vital signs reviewed and stable and Patient moving all extremities X 4  Post vital signs: Reviewed and stable  Last Vitals:  Vitals Value Taken Time  BP 87/57 03/10/22 0838  Temp    Pulse 102 03/10/22 0843  Resp 24 03/10/22 0843  SpO2 97 % 03/10/22 0843  Vitals shown include unvalidated device data.  Last Pain:  Vitals:   03/10/22 0838  TempSrc: Temporal  PainSc: Asleep         Complications: No notable events documented.

## 2022-03-11 ENCOUNTER — Encounter (HOSPITAL_COMMUNITY): Payer: Self-pay | Admitting: Internal Medicine

## 2022-03-11 ENCOUNTER — Encounter: Payer: Self-pay | Admitting: Interventional Cardiology

## 2022-03-11 LAB — POCT I-STAT EG7
Acid-base deficit: 1 mmol/L (ref 0.0–2.0)
Acid-base deficit: 1 mmol/L (ref 0.0–2.0)
Bicarbonate: 23.8 mmol/L (ref 20.0–28.0)
Bicarbonate: 25.3 mmol/L (ref 20.0–28.0)
Calcium, Ion: 1 mmol/L — ABNORMAL LOW (ref 1.15–1.40)
Calcium, Ion: 1.05 mmol/L — ABNORMAL LOW (ref 1.15–1.40)
HCT: 35 % — ABNORMAL LOW (ref 36.0–46.0)
HCT: 35 % — ABNORMAL LOW (ref 36.0–46.0)
Hemoglobin: 11.9 g/dL — ABNORMAL LOW (ref 12.0–15.0)
Hemoglobin: 11.9 g/dL — ABNORMAL LOW (ref 12.0–15.0)
O2 Saturation: 63 %
O2 Saturation: 95 %
Potassium: 3 mmol/L — ABNORMAL LOW (ref 3.5–5.1)
Potassium: 3.2 mmol/L — ABNORMAL LOW (ref 3.5–5.1)
Sodium: 145 mmol/L (ref 135–145)
Sodium: 147 mmol/L — ABNORMAL HIGH (ref 135–145)
TCO2: 25 mmol/L (ref 22–32)
TCO2: 27 mmol/L (ref 22–32)
pCO2, Ven: 39.9 mmHg — ABNORMAL LOW (ref 44–60)
pCO2, Ven: 49.4 mmHg (ref 44–60)
pH, Ven: 7.316 (ref 7.25–7.43)
pH, Ven: 7.383 (ref 7.25–7.43)
pO2, Ven: 36 mmHg (ref 32–45)
pO2, Ven: 75 mmHg — ABNORMAL HIGH (ref 32–45)

## 2022-03-12 ENCOUNTER — Encounter: Payer: Self-pay | Admitting: Family Medicine

## 2022-04-18 ENCOUNTER — Institutional Professional Consult (permissible substitution): Payer: No Typology Code available for payment source | Admitting: Neurology

## 2022-04-25 ENCOUNTER — Telehealth: Payer: Self-pay | Admitting: Family Medicine

## 2022-04-25 ENCOUNTER — Encounter (HOSPITAL_COMMUNITY)

## 2022-04-25 ENCOUNTER — Ambulatory Visit: Admitting: Surgery

## 2022-04-25 NOTE — Telephone Encounter (Signed)
Left message for patient to call back and schedule Medicare Annual Wellness Visit (AWV) either virtually or in office. Left  my jabber number [phone]   Last AWV ;04/27/21  please schedule at anytime with [facility] Nurse Health Advisor 1 or 2    

## 2022-04-27 ENCOUNTER — Ambulatory Visit: Payer: No Typology Code available for payment source | Admitting: Podiatry

## 2022-05-05 ENCOUNTER — Other Ambulatory Visit: Payer: Self-pay

## 2022-05-06 DIAGNOSIS — Z0100 Encounter for examination of eyes and vision without abnormal findings: Secondary | ICD-10-CM | POA: Diagnosis not present

## 2022-05-18 ENCOUNTER — Ambulatory Visit (INDEPENDENT_AMBULATORY_CARE_PROVIDER_SITE_OTHER): Payer: No Typology Code available for payment source | Admitting: Podiatry

## 2022-05-18 ENCOUNTER — Encounter: Payer: Self-pay | Admitting: Podiatry

## 2022-05-18 DIAGNOSIS — Q828 Other specified congenital malformations of skin: Secondary | ICD-10-CM | POA: Diagnosis not present

## 2022-05-18 DIAGNOSIS — M7752 Other enthesopathy of left foot: Secondary | ICD-10-CM | POA: Diagnosis not present

## 2022-05-18 MED ORDER — TRIAMCINOLONE ACETONIDE 10 MG/ML IJ SUSP
10.0000 mg | Freq: Once | INTRAMUSCULAR | Status: AC
  Administered 2022-05-18: 10 mg

## 2022-05-18 NOTE — Progress Notes (Unsigned)
Subjective:   Patient ID: Robin Martinez, female   DOB: 71 y.o.   MRN: [id number]   HPI Patient presents with a lot of pain with lesions on both feet 2 on the right 1 on the left and the inflammation around the fifth metatarsal base left foot.  Patient states that it has been sore to walk on the left worse than right   ROS      Objective:  Physical Exam  Neurovascular status intact with patient found to have     Assessment:  ***     Plan:  ***

## 2022-05-25 ENCOUNTER — Telehealth: Payer: Self-pay | Admitting: Family Medicine

## 2022-05-25 NOTE — Telephone Encounter (Signed)
Left message for patient to call back and schedule Medicare Annual Wellness Visit (AWV) either virtually or in office. Left  my jabber number [phone]   Last AWV ;04/27/21  please schedule at anytime with [facility] Nurse Health Advisor 1 or 2    

## 2022-06-01 ENCOUNTER — Ambulatory Visit (INDEPENDENT_AMBULATORY_CARE_PROVIDER_SITE_OTHER)

## 2022-06-01 VITALS — Ht 62.5 in | Wt 179.0 lb

## 2022-06-01 DIAGNOSIS — Z0000 Encounter for general adult medical examination without abnormal findings: Secondary | ICD-10-CM | POA: Diagnosis not present

## 2022-06-01 NOTE — Patient Instructions (Addendum)
Robin Martinez , Thank you for taking time to come for your Medicare Wellness Visit. I appreciate your ongoing commitment to your health goals. Please review the following plan we discussed and let me know if I can assist you in the future.   These are the goals we discussed:  Goals       Blood Pressure < 140/90      Increase physical activity (pt-stated)      Get out more.        This is a list of the screening recommended for you and due dates:  Health Maintenance  Topic Date Due   Flu Shot  05/10/2022   COVID-19 Vaccine (2 - Booster for [employer] series) 06/17/2022*   Zoster (Shingles) Vaccine (1 of 2) 09/01/2022*   Pneumonia Vaccine (1 - PCV) 11/15/2022*   Tetanus Vaccine  01/03/2023   Mammogram  02/02/2023   Colon Cancer Screening  10/13/2023   DEXA scan (bone density measurement)  Completed   Hepatitis C Screening: USPSTF Recommendation to screen - Ages 79-79 yo.  Completed   HPV Vaccine  Aged Out  *Topic was postponed. The date shown is not the original due date.   Advanced directives: No  Conditions/risks identified: None  Next appointment: Follow up in one year for your annual wellness visit     Preventive Care 65 Years and Older, Female Preventive care refers to lifestyle choices and visits with your health care provider that can promote health and wellness. What does preventive care include? A yearly physical exam. This is also called an annual well check. Dental exams once or twice a year. Routine eye exams. Ask your health care provider how often you should have your eyes checked. Personal lifestyle choices, including: Daily care of your teeth and gums. Regular physical activity. Eating a healthy diet. Avoiding tobacco and drug use. Limiting alcohol use. Practicing safe sex. Taking low-dose aspirin every day. Taking vitamin and mineral supplements as recommended by your health care provider. What happens during an annual well check? The services and  screenings done by your health care provider during your annual well check will depend on your age, overall health, lifestyle risk factors, and family history of disease. Counseling  Your health care provider may ask you questions about your: Alcohol use. Tobacco use. Drug use. Emotional well-being. Home and relationship well-being. Sexual activity. Eating habits. History of falls. Memory and ability to understand (cognition). Work and work Astronomer. Reproductive health. Screening  You may have the following tests or measurements: Height, weight, and BMI. Blood pressure. Lipid and cholesterol levels. These may be checked every 5 years, or more frequently if you are over 65 years old. Skin check. Lung cancer screening. You may have this screening every year starting at age 20 if you have a 30-pack-year history of smoking and currently smoke or have quit within the past 15 years. Fecal occult blood test (FOBT) of the stool. You may have this test every year starting at age 64. Flexible sigmoidoscopy or colonoscopy. You may have a sigmoidoscopy every 5 years or a colonoscopy every 10 years starting at age 61. Hepatitis C blood test. Hepatitis B blood test. Sexually transmitted disease (STD) testing. Diabetes screening. This is done by checking your blood sugar (glucose) after you have not eaten for a while (fasting). You may have this done every 1-3 years. Bone density scan. This is done to screen for osteoporosis. You may have this done starting at age 33. Mammogram. This may be done  every 1-2 years. Talk to your health care provider about how often you should have regular mammograms. Talk with your health care provider about your test results, treatment options, and if necessary, the need for more tests. Vaccines  Your health care provider may recommend certain vaccines, such as: Influenza vaccine. This is recommended every year. Tetanus, diphtheria, and acellular pertussis (Tdap,  Td) vaccine. You may need a Td booster every 10 years. Zoster vaccine. You may need this after age 54. Pneumococcal 13-valent conjugate (PCV13) vaccine. One dose is recommended after age 47. Pneumococcal polysaccharide (PPSV23) vaccine. One dose is recommended after age 110. Talk to your health care provider about which screenings and vaccines you need and how often you need them. This information is not intended to replace advice given to you by your health care provider. Make sure you discuss any questions you have with your health care provider. Document Released: 10/23/2015 Document Revised: 06/15/2016 Document Reviewed: 07/28/2015 Elsevier Interactive Patient Education  2017 [employer] Prevention in the Home Falls can cause injuries. They can happen to people of all ages. There are many things you can do to make your home safe and to help prevent falls. What can I do on the outside of my home? Regularly fix the edges of walkways and driveways and fix any cracks. Remove anything that might make you trip as you walk through a door, such as a raised step or threshold. Trim any bushes or trees on the path to your home. Use bright outdoor lighting. Clear any walking paths of anything that might make someone trip, such as rocks or tools. Regularly check to see if handrails are loose or broken. Make sure that both sides of any steps have handrails. Any raised decks and porches should have guardrails on the edges. Have any leaves, snow, or ice cleared regularly. Use sand or salt on walking paths during winter. Clean up any spills in your garage right away. This includes oil or grease spills. What can I do in the bathroom? Use night lights. Install grab bars by the toilet and in the tub and shower. Do not use towel bars as grab bars. Use non-skid mats or decals in the tub or shower. If you need to sit down in the shower, use a plastic, non-slip stool. Keep the floor dry. Clean up any  water that spills on the floor as soon as it happens. Remove soap buildup in the tub or shower regularly. Attach bath mats securely with double-sided non-slip rug tape. Do not have throw rugs and other things on the floor that can make you trip. What can I do in the bedroom? Use night lights. Make sure that you have a light by your bed that is easy to reach. Do not use any sheets or blankets that are too big for your bed. They should not hang down onto the floor. Have a firm chair that has side arms. You can use this for support while you get dressed. Do not have throw rugs and other things on the floor that can make you trip. What can I do in the kitchen? Clean up any spills right away. Avoid walking on wet floors. Keep items that you use a lot in easy-to-reach places. If you need to reach something above you, use a strong step stool that has a grab bar. Keep electrical cords out of the way. Do not use floor polish or wax that makes floors slippery. If you must use wax, use  non-skid floor wax. Do not have throw rugs and other things on the floor that can make you trip. What can I do with my stairs? Do not leave any items on the stairs. Make sure that there are handrails on both sides of the stairs and use them. Fix handrails that are broken or loose. Make sure that handrails are as long as the stairways. Check any carpeting to make sure that it is firmly attached to the stairs. Fix any carpet that is loose or worn. Avoid having throw rugs at the top or bottom of the stairs. If you do have throw rugs, attach them to the floor with carpet tape. Make sure that you have a light switch at the top of the stairs and the bottom of the stairs. If you do not have them, ask someone to add them for you. What else can I do to help prevent falls? Wear shoes that: Do not have high heels. Have rubber bottoms. Are comfortable and fit you well. Are closed at the toe. Do not wear sandals. If you use a  stepladder: Make sure that it is fully opened. Do not climb a closed stepladder. Make sure that both sides of the stepladder are locked into place. Ask someone to hold it for you, if possible. Clearly Norma and make sure that you can see: Any grab bars or handrails. First and last steps. Where the edge of each step is. Use tools that help you move around (mobility aids) if they are needed. These include: Canes. Walkers. Scooters. Crutches. Turn on the lights when you go into a dark area. Replace any light bulbs as soon as they burn out. Set up your furniture so you have a clear path. Avoid moving your furniture around. If any of your floors are uneven, fix them. If there are any pets around you, be aware of where they are. Review your medicines with your doctor. Some medicines can make you feel dizzy. This can increase your chance of falling. Ask your doctor what other things that you can do to help prevent falls. This information is not intended to replace advice given to you by your health care provider. Make sure you discuss any questions you have with your health care provider. Document Released: 07/23/2009 Document Revised: 03/03/2016 Document Reviewed: 10/31/2014 Elsevier Interactive Patient Education  2017 [employer].

## 2022-06-01 NOTE — Progress Notes (Signed)
Subjective:   Robin Martinez is a 71 y.o. female who presents for Medicare Annual (Subsequent) preventive examination.  Review of Systems    Virtual Visit via Telephone Note  I connected with  Robin Martinez on 06/01/22 at  3:00 PM EDT by telephone and verified that I am speaking with the correct person using two identifiers.  Location: Patient: Home Provider: Office Persons participating in the virtual visit: patient/Nurse Health Advisor   I discussed the limitations, risks, security and privacy concerns of performing an evaluation and management service by telephone and the availability of in person appointments. The patient expressed understanding and agreed to proceed.  Interactive audio and video telecommunications were attempted between this nurse and patient, however failed, due to patient having technical difficulties OR patient did not have access to video capability.  We continued and completed visit with audio only.  Some vital signs may be absent or patient reported.   Tillie Rung, LPN  Cardiac Risk Factors include: advanced age (>5men, >96 women);hypertension     Objective:    Today's Vitals   06/01/22 1506  Weight: 179 lb (81.2 kg)  Height: 5' 2.5" (1.588 m)   Body mass index is 32.22 kg/m.     06/01/2022    3:15 PM 03/10/2022    6:56 AM 04/27/2021   11:19 AM 12/20/2018   10:35 AM  Advanced Directives  Does Patient Have a Medical Advance Directive? Yes Yes Yes Yes  Type of Estate agent of [place];Living will Healthcare Power of [place];Living will Healthcare Power of [place];Living will Healthcare Power of [place];Living will  Does patient want to make changes to medical advance directive? No - Patient declined     Copy of Healthcare Power of Attorney in Chart? No - copy requested No - copy requested No - copy requested     Current Medications (verified) Outpatient Encounter Medications as of 06/01/2022  Medication Sig    aspirin EC 81 MG tablet Take 81 mg by mouth daily. Swallow whole.   cholecalciferol (VITAMIN D3) 25 MCG (1000 UNIT) tablet Take 1,000 Units by mouth daily.   Cyanocobalamin (VITAMIN B-12) 5000 MCG TBDP Take 5,000 mcg by mouth daily.   Flaxseed, Linseed, (FLAXSEED OIL) 1000 MG CAPS Take 1,000 mg by mouth daily.   triamterene-hydrochlorothiazide (MAXZIDE-25) 37.5-25 MG tablet Take 1 tablet by mouth daily. Appointment due   vitamin E 180 MG (400 UNITS) capsule Take 400 Units by mouth daily.   No facility-administered encounter medications on file as of 06/01/2022.    Allergies (verified) Patient has no known allergies.   History: Past Medical History:  Diagnosis Date   Allergy    Arthritis    GERD (gastroesophageal reflux disease)    Hypertension    Past Surgical History:  Procedure Laterality Date   BREAST SURGERY     biopsy   ENDOVENOUS ABLATION SAPHENOUS VEIN W/ LASER Right 12/12/2018   endovenous laser ablation right greater saphenous vein by Fabienne Bruns MD   RIGHT/LEFT HEART CATH AND CORONARY ANGIOGRAPHY N/A 03/10/2022   Procedure: RIGHT/LEFT HEART CATH AND CORONARY ANGIOGRAPHY;  Surgeon: Kathleene Hazel, MD;  Location: [facility] INVASIVE CV LAB;  Service: Cardiovascular;  Laterality: N/A;   stab phlebectomy  Right 09/11/2019   stab phlebectomy > 20 incisions right leg by Fabienne Bruns MD    TEE WITHOUT CARDIOVERSION N/A 03/10/2022   Procedure: TRANSESOPHAGEAL ECHOCARDIOGRAM (TEE);  Surgeon: Maisie Fus, MD;  Location: [facility] ENDOSCOPY;  Service: Cardiovascular;  Laterality: N/A;  Family History  Problem Relation Age of Onset   Hypertension Mother    Dementia Mother    Stroke Father    Hypertension Father    Cancer Maternal Grandmother        gyn ("vaginal")   Diabetes Maternal Grandfather    Social History   Socioeconomic History   Marital status: Married    Spouse name: Not on file   Number of children: Not on file   Years of education: Not on file   Highest  education level: Not on file  Occupational History   Not on file  Tobacco Use   Smoking status: Never   Smokeless tobacco: Never  Substance and Sexual Activity   Alcohol use: No   Drug use: No   Sexual activity: Not Currently  Other Topics Concern   Not on file  Social History Narrative   Not on file   Social Determinants of Health   Financial Resource Strain: Low Risk  (06/01/2022)   Overall Financial Resource Strain (CARDIA)    Difficulty of Paying Living Expenses: Not hard at all  Food Insecurity: No Food Insecurity (06/01/2022)   Hunger Vital Sign    Worried About Running Out of Food in the Last Year: Never true    Ran Out of Food in the Last Year: Never true  Transportation Needs: No Transportation Needs (06/01/2022)   PRAPARE - Administrator, Civil Service (Medical): No    Lack of Transportation (Non-Medical): No  Physical Activity: Insufficiently Active (06/01/2022)   Exercise Vital Sign    Days of Exercise per Week: 3 days    Minutes of Exercise per Session: 30 min  Stress: No Stress Concern Present (06/01/2022)   [employer] of Occupational Health - Occupational Stress Questionnaire    Feeling of Stress : Not at all  Social Connections: Socially Integrated (06/01/2022)   Social Connection and Isolation Panel [NHANES]    Frequency of Communication with Friends and Family: More than three times a week    Frequency of Social Gatherings with Friends and Family: More than three times a week    Attends Religious Services: More than 4 times per year    Active Member of [employer] or Organizations: Yes    Attends Engineer, structural: More than 4 times per year    Marital Status: Married    Tobacco Counseling Counseling given: Not Answered   Clinical Intake:  Pre-visit preparation completed: No  Pain : No/denies pain     BMI - recorded: 32.73 Nutritional Status: BMI > 30  Obese Nutritional Risks: None Diabetes: No  How often do you need  to have someone help you when you read instructions, pamphlets, or other written materials from your doctor or pharmacy?: 1 - Never  Diabetic?  No  Interpreter Needed?: No  Information entered by :: Theresa Mulligan LPN   Activities of Daily Living    06/01/2022    3:12 PM  In your present state of health, do you have any difficulty performing the following activities:  Hearing? 0  Vision? 0  Difficulty concentrating or making decisions? 0  Walking or climbing stairs? 0  Dressing or bathing? 0  Doing errands, shopping? 0  Preparing Food and eating ? N  Using the Toilet? N  In the past six months, have you accidently leaked urine? N  Do you have problems with loss of bowel control? N  Managing your Medications? N  Managing your Finances? N  Housekeeping or  managing your Housekeeping? N    Patient Care Team: [place], Betty G, MD as PCP - General (Family Medicine) Corky Crafts, MD as PCP - Cardiology (Cardiology)  Indicate any recent Medical Services you may have received from other than [facility] providers in the past year (date may be approximate).     Assessment:   This is a routine wellness examination for Fidelis.  Hearing/Vision screen Hearing Screening - Comments:: No hearing difficu;ty Vision Screening - Comments:: Wears glasses. Followed by [facility]  Dietary issues and exercise activities discussed: Exercise limited by: None identified   Goals Addressed               This Visit's Progress     Increase physical activity (pt-stated)        Get out more.       Depression Screen    06/01/2022    3:11 PM 11/15/2021    9:18 AM 11/15/2021    9:05 AM 04/27/2021   11:21 AM 04/27/2021   11:17 AM 06/06/2020    4:19 PM 11/05/2017    3:27 PM  PHQ 2/9 Scores  PHQ - 2 Score 0 1 1 0 0 0 0    Fall Risk    06/01/2022    3:13 PM 11/15/2021    9:18 AM 11/15/2021    9:04 AM 04/27/2021   11:21 AM  Fall Risk   Falls in the past year? 0 0 0 0  Number falls in  past yr: 0   0  Injury with Fall? 0  0 0  Risk for fall due to : No Fall Risks     Follow up   Education provided Falls evaluation completed    FALL RISK PREVENTION PERTAINING TO THE HOME:  Any stairs in or around the home? Yes  If so, are there any without handrails? No  Home free of loose throw rugs in walkways, pet beds, electrical cords, etc? Yes  Adequate lighting in your home to reduce risk of falls? Yes   ASSISTIVE DEVICES UTILIZED TO PREVENT FALLS:  Life alert? No  Use of a cane, walker or w/c? No  Grab bars in the bathroom? Yes  Shower chair or bench in shower? Yes  Elevated toilet seat or a handicapped toilet? Yes   TIMED UP AND GO:  Was the test performed? No . Audio Visit   Cognitive Function:        06/01/2022    3:15 PM  6CIT Screen  What Year? 0 points  What month? 0 points  What time? 0 points  Count back from 20 0 points  Months in reverse 0 points  Repeat phrase 0 points  Total Score 0 points    Immunizations Immunization History  Administered Date(s) Administered   Janssen (J&J) SARS-COV-2 Vaccination 02/19/2020    TDAP status: Up to date  Flu Vaccine status: Declined, Education has been provided regarding the importance of this vaccine but patient still declined. Advised may receive this vaccine at local pharmacy or Health Dept. Aware to provide a copy of the vaccination record if obtained from local pharmacy or Health Dept. Verbalized acceptance and understanding.  Pneumococcal vaccine status: Declined,  Education has been provided regarding the importance of this vaccine but patient still declined. Advised may receive this vaccine at local pharmacy or Health Dept. Aware to provide a copy of the vaccination record if obtained from local pharmacy or Health Dept. Verbalized acceptance and understanding.   Covid-19 vaccine status: Declined,  Education has been provided regarding the importance of this vaccine but patient still declined. Advised  may receive this vaccine at local pharmacy or Health Dept.or vaccine clinic. Aware to provide a copy of the vaccination record if obtained from local pharmacy or Health Dept. Verbalized acceptance and understanding.  Qualifies for Shingles Vaccine? Yes   Zostavax completed No   Shingrix Completed?: No.    Education has been provided regarding the importance of this vaccine. Patient has been advised to call insurance company to determine out of pocket expense if they have not yet received this vaccine. Advised may also receive vaccine at local pharmacy or Health Dept. Verbalized acceptance and understanding.  Screening Tests Health Maintenance  Topic Date Due   INFLUENZA VACCINE  05/10/2022   COVID-19 Vaccine (2 - Booster for [employer]anssen series) 06/17/2022 (Originally 04/15/2020)   Zoster Vaccines- Shingrix (1 of 2) 09/01/2022 (Originally 03/17/2003)   Pneumonia Vaccine 51+ Years old (1 - PCV) 11/15/2022 (Originally 03/16/2018)   TETANUS/TDAP  01/03/2023   MAMMOGRAM  02/02/2023   COLONOSCOPY (Pts 45-55yrs Insurance coverage will need to be confirmed)  10/13/2023   DEXA SCAN  Completed   Hepatitis C Screening  Completed   HPV VACCINES  Aged Out    Health Maintenance  Health Maintenance Due  Topic Date Due   INFLUENZA VACCINE  05/10/2022    Colorectal cancer screening: Type of screening: Colonoscopy. Completed 10/12/13. Repeat every 10 years  Mammogram status: Completed 02/01/22. Repeat every year  Bone Density status: Completed 12/10/19. Results reflect: Bone density results: OSTEOPOROSIS. Repeat every   years.  Lung Cancer Screening: (Low Dose CT Chest recommended if Age 25-80 years, 30 pack-year currently smoking OR have quit w/in 15years.) does not qualify.     Additional Screening:  Hepatitis C Screening: does qualify; Completed 10/30/17  Vision Screening: Recommended annual ophthalmology exams for early detection of glaucoma and other disorders of the eye. Is the patient up to date with  their annual eye exam?  Yes  Who is the provider or what is the name of the office in which the patient attends annual eye exams? [facility] If pt is not established with a provider, would they like to be referred to a provider to establish care? No .   Dental Screening: Recommended annual dental exams for proper oral hygiene  Community Resource Referral / Chronic Care Management:  CRR required this visit?  No   CCM required this visit?  No      Plan:     I have personally reviewed and noted the following in the patient's chart:   Medical and social history Use of alcohol, tobacco or illicit drugs  Current medications and supplements including opioid prescriptions. Patient is not currently taking opioid prescriptions. Functional ability and status Nutritional status Physical activity Advanced directives List of other physicians Hospitalizations, surgeries, and ER visits in previous 12 months Vitals Screenings to include cognitive, depression, and falls Referrals and appointments  In addition, I have reviewed and discussed with patient certain preventive protocols, quality metrics, and best practice recommendations. A written personalized care plan for preventive services as well as general preventive health recommendations were provided to patient.     Tillie Rung, LPN   12/23/4006   Nurse Notes: None

## 2022-06-04 ENCOUNTER — Encounter: Payer: Self-pay | Admitting: Family Medicine

## 2022-06-20 NOTE — Progress Notes (Deleted)
Cardiology Office Note   Date:  06/20/2022   ID:  Preethi Scantlebury, DOB 1950/12/19, MRN [id number]  PCP:  [place], Betty G, MD    No chief complaint on file.  SHOB  Wt Readings from Last 3 Encounters:  06/01/22 179 lb (81.2 kg)  03/10/22 177 lb (80.3 kg)  03/08/22 177 lb (80.3 kg)       History of Present Illness: Robin Martinez is a 71 y.o. female who I saw for shortness of breath in 2023.  She had noted palpitations for years prior to that visit.  Palpitations were worse with stress.  Echocardiogram was performed.  Calcium scoring CT was also recommended.  Ultimately, there is a concern for severe mitral regurgitation and decreased LVEF of 45 to 50%.  She underwent TEE and cardiac cath showing: "Her mitral regurgitation did not appear severe by TEE.  No significant blockages by cath.  No intervention needed.  Continue to follow blood pressure at home.  If there are high readings, let us know as that can worsen the valve leak.  Cath showed: "Aneurysmal segment in the proximal LAD. Mild plaque in the mid LAD No obstructive disease in the Circumflex artery The RCA is a large dominant artery with moderate non-obstructive ostial stenosis Normal right and left heart pressures RA 7, RV 34/9/8, PA 33/12 mean 24, PCWP 10, AO 133/87, LV 133/8/8   Recommendations: Her filling pressures are normal. Non-obstructive CAD. Medical management of CAD."    Past Medical History:  Diagnosis Date   Allergy    Arthritis    GERD (gastroesophageal reflux disease)    Hypertension     Past Surgical History:  Procedure Laterality Date   BREAST SURGERY     biopsy   ENDOVENOUS ABLATION SAPHENOUS VEIN W/ LASER Right 12/12/2018   endovenous laser ablation right greater saphenous vein by Fabienne Bruns MD   RIGHT/LEFT HEART CATH AND CORONARY ANGIOGRAPHY N/A 03/10/2022   Procedure: RIGHT/LEFT HEART CATH AND CORONARY ANGIOGRAPHY;  Surgeon: Kathleene Hazel, MD;  Location: [facility];   Service: Cardiovascular;  Laterality: N/A;   stab phlebectomy  Right 09/11/2019   stab phlebectomy > 20 incisions right leg by Fabienne Bruns MD    TEE WITHOUT CARDIOVERSION N/A 03/10/2022   Procedure: TRANSESOPHAGEAL ECHOCARDIOGRAM (TEE);  Surgeon: Maisie Fus, MD;  Location: [facility] ENDOSCOPY;  Service: Cardiovascular;  Laterality: N/A;     Current Outpatient Medications  Medication Sig Dispense Refill   aspirin EC 81 MG tablet Take 81 mg by mouth daily. Swallow whole.     cholecalciferol (VITAMIN D3) 25 MCG (1000 UNIT) tablet Take 1,000 Units by mouth daily.     Cyanocobalamin (VITAMIN B-12) 5000 MCG TBDP Take 5,000 mcg by mouth daily.     Flaxseed, Linseed, (FLAXSEED OIL) 1000 MG CAPS Take 1,000 mg by mouth daily.     triamterene-hydrochlorothiazide (MAXZIDE-25) 37.5-25 MG tablet Take 1 tablet by mouth daily. Appointment due 90 tablet 2   vitamin E 180 MG (400 UNITS) capsule Take 400 Units by mouth daily.     No current facility-administered medications for this visit.    Allergies:   Patient has no known allergies.    Social History:  The patient  reports that she has never smoked. She has never used smokeless tobacco. She reports that she does not drink alcohol and does not use drugs.   Family History:  The patient's ***family history includes Cancer in her maternal grandmother; Dementia in her mother; Diabetes in  her maternal grandfather; Hypertension in her father and mother; Stroke in her father.    ROS:  Please see the history of present illness.   Otherwise, review of systems are positive for ***.   All other systems are reviewed and negative.    PHYSICAL EXAM: VS:  There were no vitals taken for this visit. , BMI There is no height or weight on file to calculate BMI. GEN: Well nourished, well developed, in no acute distress HEENT: normal Neck: no JVD, carotid bruits, or masses Cardiac: ***RRR; no murmurs, rubs, or gallops,no edema  Respiratory:  clear to auscultation  bilaterally, normal work of breathing GI: soft, nontender, nondistended, + BS MS: no deformity or atrophy Skin: warm and dry, no rash Neuro:  Strength and sensation are intact Psych: euthymic mood, full affect   EKG:   The ekg ordered today demonstrates ***   Recent Labs: 11/15/2021: ALT 16; TSH 2.43 03/08/2022: BUN 17; Creatinine, Ser 0.92; Platelets 241 03/10/2022: Hemoglobin 11.9; Potassium 3.0; Sodium 147   Lipid Panel    Component Value Date/Time   CHOL 198 11/15/2021 0930   TRIG 82.0 11/15/2021 0930   HDL 63.70 11/15/2021 0930   CHOLHDL 3 11/15/2021 0930   VLDL 16.4 11/15/2021 0930   LDLCALC 117 (H) 11/15/2021 0930   LDLCALC 130 (H) 06/02/2020 1024     Other studies Reviewed: Additional studies/ records that were reviewed today with results demonstrating: ***.   ASSESSMENT AND PLAN:  LV dysfunction: Mitral regurgitation: Hypertension:   Current medicines are reviewed at length with the patient today.  The patient concerns regarding her medicines were addressed.  The following changes have been made:  No change***  Labs/ tests ordered today include: *** No orders of the defined types were placed in this encounter.   Recommend 150 minutes/week of aerobic exercise Low fat, low carb, high fiber diet recommended  Disposition:   FU in ***   Signed, Lance Muss, MD  06/20/2022 4:59 PM    [facility] [address] [place], [place], [place]  [address] Phone: [phone]; Fax: [phone]

## 2022-06-24 ENCOUNTER — Ambulatory Visit: Admitting: Interventional Cardiology

## 2022-06-24 DIAGNOSIS — I10 Essential (primary) hypertension: Secondary | ICD-10-CM

## 2022-06-24 DIAGNOSIS — R0602 Shortness of breath: Secondary | ICD-10-CM

## 2022-06-24 DIAGNOSIS — I340 Nonrheumatic mitral (valve) insufficiency: Secondary | ICD-10-CM

## 2022-06-24 DIAGNOSIS — Z6832 Body mass index (BMI) 32.0-32.9, adult: Secondary | ICD-10-CM | POA: Diagnosis not present

## 2022-06-24 DIAGNOSIS — I771 Stricture of artery: Secondary | ICD-10-CM | POA: Diagnosis not present

## 2022-06-24 DIAGNOSIS — Z008 Encounter for other general examination: Secondary | ICD-10-CM | POA: Diagnosis not present

## 2022-06-24 DIAGNOSIS — I519 Heart disease, unspecified: Secondary | ICD-10-CM

## 2022-06-24 DIAGNOSIS — E669 Obesity, unspecified: Secondary | ICD-10-CM | POA: Diagnosis not present

## 2022-08-01 ENCOUNTER — Ambulatory Visit
Payer: No Typology Code available for payment source | Attending: Interventional Cardiology | Admitting: Interventional Cardiology

## 2022-08-01 ENCOUNTER — Encounter: Payer: Self-pay | Admitting: Interventional Cardiology

## 2022-08-01 VITALS — BP 148/84 | HR 61 | Ht 62.0 in | Wt 181.0 lb

## 2022-08-01 DIAGNOSIS — I10 Essential (primary) hypertension: Secondary | ICD-10-CM | POA: Diagnosis not present

## 2022-08-01 DIAGNOSIS — I519 Heart disease, unspecified: Secondary | ICD-10-CM

## 2022-08-01 DIAGNOSIS — I340 Nonrheumatic mitral (valve) insufficiency: Secondary | ICD-10-CM

## 2022-08-01 DIAGNOSIS — R0602 Shortness of breath: Secondary | ICD-10-CM | POA: Diagnosis not present

## 2022-08-01 NOTE — Patient Instructions (Signed)
Medication Instructions:  Your physician recommends that you continue on your current medications as directed. Please refer to the Current Medication list given to you today.  *If you need a refill on your cardiac medications before your next appointment, please call your pharmacy*   Lab Work: none If you have labs (blood work) drawn today and your tests are completely normal, you will receive your results only by: MyChart Message (if you have MyChart) OR A paper copy in the mail If you have any lab test that is abnormal or we need to change your treatment, we will call you to review the results.   Testing/Procedures: none   Follow-Up: At [facility], you and your health needs are our priority.  As part of our continuing mission to provide you with exceptional heart care, we have created designated Provider Care Teams.  These Care Teams include your primary Cardiologist (physician) and Advanced Practice Providers (APPs -  Physician Assistants and Nurse Practitioners) who all work together to provide you with the care you need, when you need it.  We recommend signing up for the patient portal called "MyChart".  Sign up information is provided on this After Visit Summary.  MyChart is used to connect with patients for Virtual Visits (Telemedicine).  Patients are able to view lab/test results, encounter notes, upcoming appointments, etc.  Non-urgent messages can be sent to your provider as well.   To learn more about what you can do with MyChart, go to [email or web address].    Your next appointment:   12 month(s)  The format for your next appointment:   In Person  Provider:   Jayadeep Varanasi, MD     Other Instructions    Important Information About Sugar       

## 2022-08-01 NOTE — Progress Notes (Signed)
Cardiology Office Note   Date:  08/01/2022   ID:  Robin Martinez, DOB 02/26/1951, MRN [id number]  PCP:  [place], Betty G, MD    No chief complaint on file.  Mitral regurgitation  Wt Readings from Last 3 Encounters:  08/01/22 181 lb (82.1 kg)  06/01/22 179 lb (81.2 kg)  03/10/22 177 lb (80.3 kg)       History of Present Illness: Robin Martinez is a 71 y.o. female who I saw in May 2023 for shortness of breath.  She had reported palpitations for years preceding.  In May 2023, there was a concern for significant mitral regurgitation.  Transthoracic echocardiogram showed EF 45 to 50%. TEE was done showing this was only mild. Cardiac cath was done showing: "Ost RCA to Prox RCA lesion is 50% stenosed.   Aneurysmal segment in the proximal LAD. Mild plaque in the mid LAD No obstructive disease in the Circumflex artery The RCA is a large dominant artery with moderate non-obstructive ostial stenosis Normal right and left heart pressures RA 7, RV 34/9/8, PA 33/12 mean 24, PCWP 10, AO 133/87, LV 133/8/8   Recommendations: Her filling pressures are normal. Non-obstructive CAD. Medical management of CAD. "   Past Medical History:  Diagnosis Date   Allergy    Arthritis    GERD (gastroesophageal reflux disease)    Hypertension     Past Surgical History:  Procedure Laterality Date   BREAST SURGERY     biopsy   ENDOVENOUS ABLATION SAPHENOUS VEIN W/ LASER Right 12/12/2018   endovenous laser ablation right greater saphenous vein by Fabienne Bruns MD   RIGHT/LEFT HEART CATH AND CORONARY ANGIOGRAPHY N/A 03/10/2022   Procedure: RIGHT/LEFT HEART CATH AND CORONARY ANGIOGRAPHY;  Surgeon: Kathleene Hazel, MD;  Location: [facility]AS[facility];  Service: Cardiovascular;  Laterality: N/A;   stab phlebectomy  Right 09/11/2019   stab phlebectomy > 20 incisions right leg by Fabienne Bruns MD    TEE WITHOUT CARDIOVERSION N/A 03/10/2022   Procedure: TRANSESOPHAGEAL ECHOCARDIOGRAM (TEE);   Surgeon: Maisie Fus, MD;  Location: [facility] ENDOSCOPY;  Service: Cardiovascular;  Laterality: N/A;     Current Outpatient Medications  Medication Sig Dispense Refill   cholecalciferol (VITAMIN D3) 25 MCG (1000 UNIT) tablet Take 1,000 Units by mouth daily.     Cyanocobalamin (VITAMIN B-12) 5000 MCG TBDP Take 5,000 mcg by mouth daily.     Flaxseed, Linseed, (FLAXSEED OIL) 1000 MG CAPS Take 1,000 mg by mouth daily.     Magnesium 100 MG CAPS Take 1 capsule by mouth daily as needed.     triamterene-hydrochlorothiazide (MAXZIDE-25) 37.5-25 MG tablet Take 1 tablet by mouth daily. Appointment due 90 tablet 2   vitamin E 180 MG (400 UNITS) capsule Take 400 Units by mouth daily.     aspirin EC 81 MG tablet Take 81 mg by mouth daily. Swallow whole. (Patient not taking: Reported on 08/01/2022)     No current facility-administered medications for this visit.    Allergies:   Patient has no known allergies.    Social History:  The patient  reports that she has never smoked. She has never used smokeless tobacco. She reports that she does not drink alcohol and does not use drugs.   Family History:  The patient's family history includes Cancer in her maternal grandmother; Dementia in her mother; Diabetes in her maternal grandfather; Hypertension in her father and mother; Stroke in her father.    ROS:  Please see the history of  present illness.   Otherwise, review of systems are positive for increasing activity.   All other systems are reviewed and negative.    PHYSICAL EXAM: VS:  BP (!) 148/84   Pulse 61   Ht 5\' 2"  (1.575 m)   Wt 181 lb (82.1 kg)   SpO2 99%   BMI 33.11 kg/m  , BMI Body mass index is 33.11 kg/m. GEN: Well nourished, well developed, in no acute distress HEENT: normal Neck: no JVD, carotid bruits, or masses Cardiac: RRR; no murmurs, rubs, or gallops,no edema  Respiratory:  clear to auscultation bilaterally, normal work of breathing GI: soft, nontender, nondistended, + BS MS: no  deformity or atrophy Skin: warm and dry, no rash Neuro:  Strength and sensation are intact Psych: euthymic mood, full affect    Recent Labs: 11/15/2021: ALT 16; TSH 2.43 03/08/2022: BUN 17; Creatinine, Ser 0.92; Platelets 241 03/10/2022: Hemoglobin 11.9; Potassium 3.0; Sodium 147   Lipid Panel    Component Value Date/Time   CHOL 198 11/15/2021 0930   TRIG 82.0 11/15/2021 0930   HDL 63.70 11/15/2021 0930   CHOLHDL 3 11/15/2021 0930   VLDL 16.4 11/15/2021 0930   LDLCALC 117 (H) 11/15/2021 0930   LDLCALC 130 (H) 06/02/2020 1024     Other studies Reviewed: Additional studies/ records that were reviewed today with results demonstrating: LDL 117.   ASSESSMENT AND PLAN:  CAD: Mild. No angina.  Uses treadmill. Taking aspirin on occasion.  No bleeding problems, but decresed frequency on her own.  Mitral regurgitation: No CHF.  Mild mitral regurgitation by TEE.   Hypertension: The current medical regimen is effective;  continue present plan and medications.  Continue to check blood pressure at home.  Target less than 140/90.  Home readings in the 403K systolic currently.  Missed her medicine today. LV dysfunction: Prior shortness of breath.  Mild LV dysfunction by ejection fraction reading.  Eating more fiber and fresh fruit.    Current medicines are reviewed at length with the patient today.  The patient concerns regarding her medicines were addressed.  The following changes have been made:  No change  Labs/ tests ordered today include:  No orders of the defined types were placed in this encounter.   Recommend 150 minutes/week of aerobic exercise Low fat, low carb, high fiber diet recommended  Disposition:   FU in 1 year   Signed, Larae Grooms, MD  08/01/2022 3:44 PM    [facility] [place], [place], [place]  [address] Phone: [phone]; Fax: [phone]

## 2022-08-08 NOTE — Progress Notes (Signed)
ACUTE VISIT Chief Complaint  Patient presents with   Back Pain   Flank Pain   HPI: [name] is a 71 y.o. female with medical history significant for prediabetes, hypertension, hyperlipidemia, and GERD here today complaining of new onset of lower back pain on the right side, which lasted 2 days.  Pain was radiated from her lower back down her right leg. She reports that the pain started while working at her new job, where she sits in a chair all day and frequently twists her body. She also mentions that her husband had sciatica, and he recommended stretching exercises and local heat, which helped tremendously.  Left upper back pain and posterior shoulder pain intermittently, also improved.  Back Pain This is a new problem. The problem has been resolved since onset. The pain is present in the lumbar spine. The pain is at a severity of 8/10. The pain is severe. The symptoms are aggravated by bending, twisting and position. Stiffness is present All day. Associated symptoms include leg pain. Pertinent negatives include no abdominal pain, bladder incontinence, bowel incontinence, chest pain, dysuria, fever, headaches, numbness, paresthesias, pelvic pain, perianal numbness, tingling, weakness or weight loss. Risk factors include sedentary lifestyle and poor posture. She has tried home exercises and heat for the symptoms. The treatment provided significant relief.   She reports constipation during the same period as her back pain.  She took a herbal laxative called \"de-herbs\" to alleviate the constipation, which has since improved. She had her last bowel movement this morning, with no blood present. She also reports more frequent urination but denies any burning sensation or blood in the urine.She has increased fluid intake.  The patient has noticed a change in her walking posture, stating that she now walks with a hunched posture and has difficulty straightening up. She expresses concern  about her posture and is interested in physical therapy to address this issue."  Review of Systems  Constitutional:  Negative for fever and weight loss.  Respiratory:  Negative for cough, shortness of breath and wheezing.   Cardiovascular:  Negative for chest pain and leg swelling.  Gastrointestinal:  Negative for abdominal pain, bowel incontinence, nausea and vomiting.  Genitourinary:  Negative for bladder incontinence, dysuria and pelvic pain.  Musculoskeletal:  Positive for back pain. Negative for gait problem.  Skin:  Negative for rash.  Neurological:  Negative for tingling, weakness, numbness, headaches and paresthesias.  Rest see pertinent positives and negatives per HPI.  Current Outpatient Medications on File Prior to Visit  Medication Sig Dispense Refill   aspirin EC 81 MG tablet Take 81 mg by mouth daily. Swallow whole.     cholecalciferol (VITAMIN D3) 25 MCG (1000 UNIT) tablet Take 1,000 Units by mouth daily.     Cyanocobalamin (VITAMIN B-12) 5000 MCG TBDP Take 5,000 mcg by mouth daily.     Flaxseed, Linseed, (FLAXSEED OIL) 1000 MG CAPS Take 1,000 mg by mouth daily.     Magnesium 100 MG CAPS Take 1 capsule by mouth daily as needed.     triamterene-hydrochlorothiazide (MAXZIDE-25) 37.5-25 MG tablet Take 1 tablet by mouth daily. Appointment due 90 tablet 2   vitamin E 180 MG (400 UNITS) capsule Take 400 Units by mouth daily.     No current facility-administered medications on file prior to visit.   Past Medical History:  Diagnosis Date   Allergy    Arthritis    GERD (gastroesophageal reflux disease)    Hypertension    No Known Allergies  Social History   Socioeconomic History   Marital status: Married    Spouse name: Not on file   Number of children: Not on file   Years of education: Not on file   Highest education level: Not on file  Occupational History   Not on file  Tobacco Use   Smoking status: Never   Smokeless tobacco: Never  Substance and Sexual  Activity   Alcohol use: No   Drug use: No   Sexual activity: Not Currently  Other Topics Concern   Not on file  Social History Narrative   Not on file   Social Determinants of Health   Financial Resource Strain: Low Risk  (06/01/2022)   Overall Financial Resource Strain (CARDIA)    Difficulty of Paying Living Expenses: Not hard at all  Food Insecurity: No Food Insecurity (06/01/2022)   Hunger Vital Sign    Worried About Running Out of Food in the Last Year: Never true    Ran Out of Food in the Last Year: Never true  Transportation Needs: No Transportation Needs (06/01/2022)   PRAPARE - Administrator, Civil Service (Medical): No    Lack of Transportation (Non-Medical): No  Physical Activity: Insufficiently Active (06/01/2022)   Exercise Vital Sign    Days of Exercise per Week: 3 days    Minutes of Exercise per Session: 30 min  Stress: No Stress Concern Present (06/01/2022)   [employer] of Occupational Health - Occupational Stress Questionnaire    Feeling of Stress : Not at all  Social Connections: Socially Integrated (06/01/2022)   Social Connection and Isolation Panel [NHANES]    Frequency of Communication with Friends and Family: More than three times a week    Frequency of Social Gatherings with Friends and Family: More than three times a week    Attends Religious Services: More than 4 times per year    Active Member of [employer] or Organizations: Yes    Attends Banker Meetings: More than 4 times per year    Marital Status: Married   Vitals:   08/09/22 1236  BP: 128/80  Pulse: 70  Resp: 16  SpO2: 97%  Body mass index is 32.83 kg/m. Physical Exam Vitals and nursing note reviewed.  Constitutional:      General: She is not in acute distress.    Appearance: She is well-developed. She is not ill-appearing.  HENT:     Head: Normocephalic and atraumatic.  Eyes:     Conjunctiva/sclera: Conjunctivae normal.  Cardiovascular:     Rate and Rhythm:  Normal rate and regular rhythm.     Heart sounds: No murmur heard. Pulmonary:     Effort: Pulmonary effort is normal. No respiratory distress.     Breath sounds: Normal breath sounds.  Abdominal:     Palpations: Abdomen is soft. There is no mass.     Tenderness: There is no abdominal tenderness.  Musculoskeletal:     Lumbar back: No tenderness or bony tenderness. Negative right straight leg raise test and negative left straight leg raise test.     Right hip: No tenderness or bony tenderness. Normal range of motion.     Comments: Pain elicited is not with movement on exam table during examination. No local edema or erythema appreciated, no suspicious lesions.  Skin:    General: Skin is warm.     Findings: No erythema or rash.  Neurological:     General: No focal deficit present.  Mental Status: She is alert and oriented to person, place, and time.     Gait: Gait normal.     Deep Tendon Reflexes:     Reflex Scores:      Patellar reflexes are 2+ on the right side and 2+ on the left side. Psychiatric:        Mood and Affect: Affect normal. Mood is anxious.   ASSESSMENT AND PLAN:  Ms. Eaden was seen today for back pain and flank pain.  Diagnoses and all orders for this visit: Orders Placed This Encounter  Procedures   DG Lumbar Spine Complete   Urinalysis with Culture Reflex   Ambulatory referral to Physical Therapy   Acute right-sided low back pain, unspecified whether sciatica present Hx suggest musculoskeletal pain, it has resolved. She still would like to have imaging done, concerned about a serious process. Improving posture and ergonomics at work will help to prevent future exacerbations, PT referral placed. Instructed about warning signs.  Constipation, unspecified constipation type Improved. Increased fiber and fluid intake.Benafiber 1 tsp bid is a good option. Miralax daily and bedtime and Bisacodyl 5 mg daily as needed also recommended. Colonoscopy in  10/2013.  Urinary frequency No other associated symptom. Could be related to increased fluid intake. UA and Ucx reflex ordered today. Monitor for new symptoms.  Return if symptoms worsen or fail to improve.  Jakaila Norment G. [place], MD  [facility]. [place] office.

## 2022-08-09 ENCOUNTER — Ambulatory Visit (INDEPENDENT_AMBULATORY_CARE_PROVIDER_SITE_OTHER): Payer: No Typology Code available for payment source

## 2022-08-09 ENCOUNTER — Ambulatory Visit (INDEPENDENT_AMBULATORY_CARE_PROVIDER_SITE_OTHER): Payer: No Typology Code available for payment source | Admitting: Family Medicine

## 2022-08-09 ENCOUNTER — Ambulatory Visit: Admitting: Family Medicine

## 2022-08-09 ENCOUNTER — Encounter: Payer: Self-pay | Admitting: Family Medicine

## 2022-08-09 VITALS — BP 128/80 | HR 70 | Resp 16 | Ht 62.0 in | Wt 179.5 lb

## 2022-08-09 DIAGNOSIS — M545 Low back pain, unspecified: Secondary | ICD-10-CM

## 2022-08-09 DIAGNOSIS — R350 Frequency of micturition: Secondary | ICD-10-CM | POA: Diagnosis not present

## 2022-08-09 DIAGNOSIS — K5900 Constipation, unspecified: Secondary | ICD-10-CM | POA: Diagnosis not present

## 2022-08-09 NOTE — Patient Instructions (Addendum)
A few things to remember from today's visit:   Acute right-sided low back pain, unspecified whether sciatica present - Plan: DG Lumbar Spine Complete, Urinalysis with Culture Reflex  Constipation, unspecified constipation type  Benefiber 1 tsp 2 times daily is a good option for fiber intake. Miralax at bedtime daily as needed and Bisacodyl 5 mg daily as needed also at bedtime for constipation.  If you need refills for medications you take chronically, please call your pharmacy. Do not use My Chart to request refills or for acute issues that need immediate attention. If you send a my chart message, it may take a few days to be addressed, specially if I am not in the office.  Please be sure medication list is accurate. If a new problem present, please set up appointment sooner than planned today.

## 2022-08-11 ENCOUNTER — Encounter: Payer: Self-pay | Admitting: Podiatry

## 2022-08-11 ENCOUNTER — Ambulatory Visit (INDEPENDENT_AMBULATORY_CARE_PROVIDER_SITE_OTHER): Payer: No Typology Code available for payment source | Admitting: Podiatry

## 2022-08-11 DIAGNOSIS — Q828 Other specified congenital malformations of skin: Secondary | ICD-10-CM | POA: Diagnosis not present

## 2022-08-11 LAB — URINALYSIS W MICROSCOPIC + REFLEX CULTURE
Bacteria, UA: NONE SEEN /HPF
Bilirubin Urine: NEGATIVE
Glucose, UA: NEGATIVE
Hgb urine dipstick: NEGATIVE
Hyaline Cast: NONE SEEN /LPF
Ketones, ur: NEGATIVE
Nitrites, Initial: NEGATIVE
Protein, ur: NEGATIVE
RBC / HPF: NONE SEEN /HPF (ref 0–2)
Specific Gravity, Urine: 1.01 (ref 1.001–1.035)
WBC, UA: NONE SEEN /HPF (ref 0–5)
pH: 6.5 (ref 5.0–8.0)

## 2022-08-11 LAB — URINE CULTURE
MICRO NUMBER:: 14126572
SPECIMEN QUALITY:: ADEQUATE

## 2022-08-11 LAB — CULTURE INDICATED

## 2022-08-11 NOTE — Progress Notes (Signed)
Subjective:   Patient ID: Robin Martinez, female   DOB: 71 y.o.   MRN: [id number]   HPI Patient presents with painful lesions of both feet that have been hard to walk with   ROS      Objective:  Physical Exam  Neurovascular status intact with keratotic lesions plantar aspect bilateral     Assessment:  Chronic lesion formation with porokeratotic like base     Plan:  Reviewed above

## 2022-08-23 ENCOUNTER — Other Ambulatory Visit: Payer: Self-pay

## 2022-08-23 ENCOUNTER — Ambulatory Visit: Payer: No Typology Code available for payment source | Attending: Family Medicine | Admitting: Physical Therapy

## 2022-08-23 ENCOUNTER — Encounter: Payer: Self-pay | Admitting: Physical Therapy

## 2022-08-23 DIAGNOSIS — M545 Low back pain, unspecified: Secondary | ICD-10-CM | POA: Diagnosis not present

## 2022-08-23 DIAGNOSIS — M6281 Muscle weakness (generalized): Secondary | ICD-10-CM | POA: Insufficient documentation

## 2022-08-23 DIAGNOSIS — M5459 Other low back pain: Secondary | ICD-10-CM

## 2022-08-23 NOTE — Therapy (Signed)
OUTPATIENT PHYSICAL THERAPY THORACOLUMBAR EVALUATION   Patient Name: Robin Martinez MRN: [id number] DOB:Mar 23, 1951, 71 y.o., female Today's Date: 08/23/2022   PT End of Session - 08/23/22 1540     Visit Number 1    Date for PT Re-Evaluation 10/18/22    PT Start Time 1535    PT Stop Time 1615    PT Time Calculation (min) 40 min    Activity Tolerance Patient tolerated treatment well             Past Medical History:  Diagnosis Date   Allergy    Arthritis    GERD (gastroesophageal reflux disease)    Hypertension    Past Surgical History:  Procedure Laterality Date   BREAST SURGERY     biopsy   ENDOVENOUS ABLATION SAPHENOUS VEIN W/ LASER Right 12/12/2018   endovenous laser ablation right greater saphenous vein by Fabienne Bruns MD   RIGHT/LEFT HEART CATH AND CORONARY ANGIOGRAPHY N/A 03/10/2022   Procedure: RIGHT/LEFT HEART CATH AND CORONARY ANGIOGRAPHY;  Surgeon: Kathleene Hazel, MD;  Location: [facility];  Service: Cardiovascular;  Laterality: N/A;   stab phlebectomy  Right 09/11/2019   stab phlebectomy > 20 incisions right leg by Fabienne Bruns MD    TEE WITHOUT CARDIOVERSION N/A 03/10/2022   Procedure: TRANSESOPHAGEAL ECHOCARDIOGRAM (TEE);  Surgeon: Maisie Fus, MD;  Location: [facility];  Service: Cardiovascular;  Laterality: N/A;   Patient Active Problem List   Diagnosis Date Noted   Nonrheumatic mitral valve regurgitation    Prediabetes 11/15/2021   GERD (gastroesophageal reflux disease) 11/15/2021   Hyperlipidemia 06/05/2019   Finger deformity, left 02/10/2018   Hypertension, essential, benign 03/14/2017   Class 1 obesity with body mass index (BMI) of 31.0 to 31.9 in adult 03/14/2017   Plantar callus 03/14/2017   Osteoarthritis, hand 03/14/2017    PCP: [place], Betty MD  REFERRING PROVIDER: [place], Betty MD  REFERRING DIAG: M54.50 acute right sided low back pain  Rationale for Evaluation and Treatment: Rehabilitation  THERAPY DIAG:   Low back pain; weakness ONSET DATE: 08/08/22  SUBJECTIVE:                                                                                                                                                                                           SUBJECTIVE STATEMENT: Sudden onset after starting work part-time 2-3 weeks ago.  Feels it's the result of repeated seated rotation.    Pain initially right above knee to across back.  Now painfree.    PERTINENT HISTORY:  Pronounced [name] -boor-rah At the end of May had surgery for heart blockage but when opened her  up there was no blockage  PAIN:  PAIN:  Are you having pain? No pain in 2 weeks NPRS scale: 0/10 Pain location: was  right above knee to low back  Aggravating factors: seated rotation Relieving factors: moving around   PRECAUTIONS: None  WEIGHT BEARING RESTRICTIONS: No  FALLS:  Has patient fallen in last 6 months? No  LIVING ENVIRONMENT: Lives with:  lives with their spouse Lives in: House/apartment Stairs: Yes: Internal: avoids  them steps; on right going up  OCCUPATION: part-time seated   PLOF: Independent  PATIENT GOALS: be able to get up off floor; go up stairs without pulling on railings  NEXT MD VISIT:   OBJECTIVE:   DIAGNOSTIC FINDINGS:  X-rays DDD lumbar  PATIENT SURVEYS:  FOTO 77%  COGNITION: Overall cognitive status: Within functional limits for tasks assessed       MUSCLE LENGTH: Hamstrings: Right 70 deg; Left 70 deg Thomas test: Right 10 deg; Left 10 deg  POSTURE: No Significant postural limitations  LUMBAR ROM:   AROM eval  Flexion Fingertips 6 inches from toes  Extension 15  Right lateral flexion 20  Left lateral flexion 20  Right rotation   Left rotation    (Blank rows = not tested)  TRUNK STRENGTH:  Decreased activation of transverse abdominus muscles; abdominals 4-/5; decreased activation of lumbar multifidi; trunk extensors 4-/5;  pain with bridge  LOWER EXTREMITY ROM:    WFLs  LOWER EXTREMITY MMT:  Pelvic drop with SLS;  needs mod assist of railings with 6 inch step down test right/left   MMT Right eval Left eval  Hip flexion 4 4  Hip extension 4 4  Hip abduction 4- 4-  Hip adduction    Hip internal rotation    Hip external rotation    Knee flexion    Knee extension 4- 4-  Ankle dorsiflexion    Ankle plantarflexion    Ankle inversion    Ankle eversion     (Blank rows = not tested)   FUNCTIONAL TESTS:  5 times sit to stand: 12 sec no UE use SLS 3-5 sec GAIT:  Comments: no impairment at slow or fast speed  TODAY'S TREATMENT:                                                                                                                              DATE: 08/23/22    PATIENT EDUCATION:  Education details: plan of care Person educated: Patient Education method: Explanation Education comprehension: verbalized understanding  HOME EXERCISE PROGRAM: Access Code: [email or web address] URL: [email or web address] Date: 08/23/2022 Prepared by: Lavinia Sharps  Exercises - Sidelying Hip Abduction  - 1 x daily - 7 x weekly - 1 sets - 10 reps - Sit to Stand  - 1 x daily - 7 x weekly - 1 sets - 10 reps - Forward Step Up  - 1 x daily - 7 x weekly - 1 sets - 10 reps  ASSESSMENT:  CLINICAL  IMPRESSION: Patient is a 71 y.o. female who was seen today for physical therapy evaluation and treatment for acute right sided low back pain that started about 3 weeks ago as the result of repeated seated rotation at work.  The pain has been mostly gone for the past 2 weeks.  Her goals are to be able to get up off the floor and to ascend and descend stairs with greater ease.  The patient would benefit from PT to address strength deficits in gluteals and quad muscles as well as abdominals and trunk extensors.    She would also benefit from muscle lengthening of HS and hip flexor muscles.    OBJECTIVE IMPAIRMENTS: decreased activity tolerance, decreased ROM,  decreased strength, increased fascial restrictions, and pain.   ACTIVITY LIMITATIONS: stairs and transfers  PARTICIPATION LIMITATIONS: occupation  PERSONAL FACTORS:  good health  are also affecting patient's functional outcome.   REHAB POTENTIAL: Good  CLINICAL DECISION MAKING: Stable/uncomplicated  EVALUATION COMPLEXITY: Low   GOALS: Goals reviewed with patient? Yes  SHORT TERM GOALS: Target date: 09/20/2022  The patient will demonstrate knowledge of basic self care strategies and exercises to promote healing   Baseline: Goal status: INITIAL  2.  The patient will have improved hip strength to at least 4/5 needed for getting up and down from the floor and descending stairs at home and in the community  Baseline:  Goal status: INITIAL  3.  The patient will have improved trunk flexor and extensor muscle strength to at least 4/5 needed for lifting light to medium weight objects   Baseline:  Goal status: INITIAL    LONG TERM GOALS: Target date: 10/18/2022  The patient will be independent in a safe self progression of a home exercise program to promote further recovery of function   Baseline:  Goal status: INITIAL  2.  The patient will report minimal to no back pain with functional activities including work duties Baseline:  Goal status: INITIAL  3.  The patient will have improved trunk flexor and extensor muscle strength to at least 4+/5 needed for lifting medium weight objects such as  laundry and luggage  Baseline:  Goal status: INITIAL  4.  The patient will have improved hip strength to at least 4+/5 needed for getting up off the floor and descending stairs at home and in the community  Baseline:  Goal status: INITIAL  5.  The patient will have improved FOTO score to   86%    indicating improved function with less pain  Baseline:  Goal status: INITIAL    PLAN:  PT FREQUENCY: 1-2x/week  PT DURATION: 8 weeks  PLANNED INTERVENTIONS: Therapeutic exercises,  Therapeutic activity, Neuromuscular re-education, Gait training, Patient/Family education, Self Care, Joint mobilization, Aquatic Therapy, Dry Needling, Electrical stimulation, Spinal manipulation, Spinal mobilization, Cryotherapy, Moist heat, Taping, Traction, Ultrasound, Ionotophoresis 4mg /ml Dexamethasone, Manual therapy, and Re-evaluation.  PLAN FOR NEXT SESSION: going to mid December for 1 month establish HEP;  bil gluteal and bil quad strengthening; practice getting up and down off the floor; up and down stairs with decreased dependence on railings; abdominal strengthening; HS and hip flexor stretch  January, PT 08/23/22 7:48 PM Phone: [phone] Fax: [phone]

## 2022-08-29 ENCOUNTER — Encounter: Payer: Self-pay | Admitting: Physical Therapy

## 2022-08-29 ENCOUNTER — Ambulatory Visit: Payer: No Typology Code available for payment source | Admitting: Physical Therapy

## 2022-08-29 DIAGNOSIS — M5459 Other low back pain: Secondary | ICD-10-CM

## 2022-08-29 DIAGNOSIS — M6281 Muscle weakness (generalized): Secondary | ICD-10-CM

## 2022-08-29 NOTE — Therapy (Signed)
OUTPATIENT PHYSICAL THERAPY TREATMENT NOTE   Patient Name: Robin Martinez MRN: [id number] DOB:10-Jul-1951, 71 y.o., female Today's Date: 08/29/2022  PCP: [place], Betty MD  REFERRING PROVIDER: [place], Betty MD   END OF SESSION:   PT End of Session - 08/29/22 1356     Visit Number 2    Date for PT Re-Evaluation 10/18/22    PT Start Time 1357    PT Stop Time 1442    PT Time Calculation (min) 45 min    Activity Tolerance Patient tolerated treatment well    Behavior During Therapy [facility] for tasks assessed/performed             Past Medical History:  Diagnosis Date   Allergy    Arthritis    GERD (gastroesophageal reflux disease)    Hypertension    Past Surgical History:  Procedure Laterality Date   BREAST SURGERY     biopsy   ENDOVENOUS ABLATION SAPHENOUS VEIN W/ LASER Right 12/12/2018   endovenous laser ablation right greater saphenous vein by Ruta Hinds MD   RIGHT/LEFT HEART CATH AND CORONARY ANGIOGRAPHY N/A 03/10/2022   Procedure: RIGHT/LEFT HEART CATH AND CORONARY ANGIOGRAPHY;  Surgeon: Burnell Blanks, MD;  Location: [place] [facility];  Service: Cardiovascular;  Laterality: N/A;   stab phlebectomy  Right 09/11/2019   stab phlebectomy > 20 incisions right leg by Ruta Hinds MD    TEE WITHOUT CARDIOVERSION N/A 03/10/2022   Procedure: TRANSESOPHAGEAL ECHOCARDIOGRAM (TEE);  Surgeon: Janina Mayo, MD;  Location: [facility] ENDOSCOPY;  Service: Cardiovascular;  Laterality: N/A;   Patient Active Problem List   Diagnosis Date Noted   Nonrheumatic mitral valve regurgitation    Prediabetes 11/15/2021   GERD (gastroesophageal reflux disease) 11/15/2021   Hyperlipidemia 06/05/2019   Finger deformity, left 02/10/2018   Hypertension, essential, benign 03/14/2017   Class 1 obesity with body mass index (BMI) of 31.0 to 31.9 in adult 03/14/2017   Plantar callus 03/14/2017   Osteoarthritis, hand 03/14/2017    REFERRING DIAG: M54.50 acute right sided low back pain    THERAPY DIAG:  Other low back pain  Muscle weakness (generalized)  Rationale for Evaluation and Treatment Rehabilitation  PERTINENT HISTORY: Pronounced [name] -boor-rah At the end of May had surgery for heart blockage but when opened her up there was no blockage    PRECAUTIONS: None  SUBJECTIVE:                                                                                                                                                                                      SUBJECTIVE STATEMENT:  Doing my HEP. A little back pain since exercising.   PAIN:  Are you having pain? No   OBJECTIVE: (objective measures completed at initial evaluation unless otherwise dated)   DIAGNOSTIC FINDINGS:  X-rays DDD lumbar   PATIENT SURVEYS:  FOTO 77%   COGNITION: Overall cognitive status: Within functional limits for tasks assessed                              MUSCLE LENGTH: Hamstrings: Right 70 deg; Left 70 deg Thomas test: Right 10 deg; Left 10 deg   POSTURE: No Significant postural limitations   LUMBAR ROM:    AROM eval  Flexion Fingertips 6 inches from toes  Extension 15  Right lateral flexion 20  Left lateral flexion 20  Right rotation    Left rotation     (Blank rows = not tested)   TRUNK STRENGTH:  Decreased activation of transverse abdominus muscles; abdominals 4-/5; decreased activation of lumbar multifidi; trunk extensors 4-/5;  pain with bridge   LOWER EXTREMITY ROM:   WFLs   LOWER EXTREMITY MMT:  Pelvic drop with SLS;  needs mod assist of railings with 6 inch step down test right/left    MMT Right eval Left eval  Hip flexion 4 4  Hip extension 4 4  Hip abduction 4- 4-  Hip adduction      Hip internal rotation      Hip external rotation      Knee flexion      Knee extension 4- 4-  Ankle dorsiflexion      Ankle plantarflexion      Ankle inversion      Ankle eversion       (Blank rows = not tested)     FUNCTIONAL TESTS:  5 times sit to stand: 12  sec no UE use SLS 3-5 sec GAIT:   Comments: no impairment at slow or fast speed   TODAY'S TREATMENT:     08/29/22: Nustep L4 (new model)  5 min with PTA present   Supine bridge 3 sec hold 5x: VC to PPT first (flatten her back) then lift, VC to engage her core at th etop before lowering. S/L hip abduction: 5x Bil: VC/TC to correct technique. Pt want to roll pelvis back and lift with hip flexors. Standing Rt hip flexor/gastroc stretch at second step. Step ups Rt10x, uses UE support. Supine Rt hip flexor stretch off bed: given for HEP                                                                                                                           DATE: 08/23/22      PATIENT EDUCATION:  Education details: plan of care Person educated: Patient Education method: Explanation Education comprehension: verbalized understanding   HOME EXERCISE PROGRAM: Access Code: [email or web address] URL: [email or web address] Date: 08/23/2022 Prepared by: Ruben Im   Exercises - Sidelying Hip Abduction  - 1 x daily - 7 x weekly - 1 sets - 10 reps -  Sit to Stand  - 1 x daily - 7 x weekly - 1 sets - 10 reps - Forward Step Up  - 1 x daily - 7 x weekly - 1 sets - 10 reps Added 08/29/22: RT hip flexor stretch supine and or standing at step Ane Payment, PTA 08/29/22 2:43 PM    ASSESSMENT:   CLINICAL IMPRESSION: Pt arrives to PT for first time follow up since evaluation. She is independent and compliant with her initial HEP. Pt had less pain to no pain when performing a PPT before supine bridge. Hip flexor stretching given for HEP progression.      OBJECTIVE IMPAIRMENTS: decreased activity tolerance, decreased ROM, decreased strength, increased fascial restrictions, and pain.    ACTIVITY LIMITATIONS: stairs and transfers   PARTICIPATION LIMITATIONS: occupation   PERSONAL FACTORS:  good health  are also affecting patient's functional outcome.    REHAB POTENTIAL: Good    CLINICAL DECISION MAKING: Stable/uncomplicated   EVALUATION COMPLEXITY: Low     GOALS: Goals reviewed with patient? Yes   SHORT TERM GOALS: Target date: 09/20/2022   The patient will demonstrate knowledge of basic self care strategies and exercises to promote healing   Baseline: Goal status: INITIAL   2.  The patient will have improved hip strength to at least 4/5 needed for getting up and down from the floor and descending stairs at home and in the community  Baseline:  Goal status: INITIAL   3.  The patient will have improved trunk flexor and extensor muscle strength to at least 4/5 needed for lifting light to medium weight objects   Baseline:  Goal status: INITIAL       LONG TERM GOALS: Target date: 10/18/2022   The patient will be independent in a safe self progression of a home exercise program to promote further recovery of function   Baseline:  Goal status: INITIAL   2.  The patient will report minimal to no back pain with functional activities including work duties Baseline:  Goal status: INITIAL   3.  The patient will have improved trunk flexor and extensor muscle strength to at least 4+/5 needed for lifting medium weight objects such as  laundry and luggage  Baseline:  Goal status: INITIAL   4.  The patient will have improved hip strength to at least 4+/5 needed for getting up off the floor and descending stairs at home and in the community  Baseline:  Goal status: INITIAL   5.  The patient will have improved FOTO score to   86%    indicating improved function with less pain  Baseline:  Goal status: INITIAL       PLAN:   PT FREQUENCY: 1-2x/week   PT DURATION: 8 weeks   PLANNED INTERVENTIONS: Therapeutic exercises, Therapeutic activity, Neuromuscular re-education, Gait training, Patient/Family education, Self Care, Joint mobilization, Aquatic Therapy, Dry Needling, Electrical stimulation, Spinal manipulation, Spinal mobilization, Cryotherapy, Moist heat,  Taping, Traction, Ultrasound, Ionotophoresis 4mg /ml Dexamethasone, Manual therapy, and Re-evaluation.   PLAN FOR NEXT SESSION: review HEP again to see if pt doing better with technique. Review hip flexor stretching and see what worked best for pt. Continue with post chain strength.  Cherica Heiden, PTA 08/29/2022, 2:43 PM

## 2022-08-31 ENCOUNTER — Ambulatory Visit: Payer: No Typology Code available for payment source | Admitting: Physical Therapy

## 2022-08-31 DIAGNOSIS — M6281 Muscle weakness (generalized): Secondary | ICD-10-CM | POA: Diagnosis not present

## 2022-08-31 DIAGNOSIS — M5459 Other low back pain: Secondary | ICD-10-CM

## 2022-08-31 NOTE — Therapy (Signed)
OUTPATIENT PHYSICAL THERAPY TREATMENT NOTE   Patient Name: Robin HurlDeborah Martinez MRN: [id number] DOB:05/10/1951, 71 y.o., female Today's Date: 08/31/2022  PCP: [place], Betty MD  REFERRING PROVIDER: [place], Betty MD   END OF SESSION:   PT End of Session - 08/31/22 1228     Visit Number 3    Date for PT Re-Evaluation 10/18/22    PT Start Time 1228   PT Stop Time 1306   PT Time Calculation (min) 38 min    Activity Tolerance Patient tolerated treatment well             Past Medical History:  Diagnosis Date   Allergy    Arthritis    GERD (gastroesophageal reflux disease)    Hypertension    Past Surgical History:  Procedure Laterality Date   BREAST SURGERY     biopsy   ENDOVENOUS ABLATION SAPHENOUS VEIN W/ LASER Right 12/12/2018   endovenous laser ablation right greater saphenous vein by Fabienne Brunsharles Fields MD   RIGHT/LEFT HEART CATH AND CORONARY ANGIOGRAPHY N/A 03/10/2022   Procedure: RIGHT/LEFT HEART CATH AND CORONARY ANGIOGRAPHY;  Surgeon: Kathleene HazelMcAlhany, Christopher D, MD;  Location: [facility];  Service: Cardiovascular;  Laterality: N/A;   stab phlebectomy  Right 09/11/2019   stab phlebectomy > 20 incisions right leg by Fabienne Brunsharles Fields MD    TEE WITHOUT CARDIOVERSION N/A 03/10/2022   Procedure: TRANSESOPHAGEAL ECHOCARDIOGRAM (TEE);  Surgeon: Maisie FusBranch, Mary E, MD;  Location: [facility];  Service: Cardiovascular;  Laterality: N/A;   Patient Active Problem List   Diagnosis Date Noted   Nonrheumatic mitral valve regurgitation    Prediabetes 11/15/2021   GERD (gastroesophageal reflux disease) 11/15/2021   Hyperlipidemia 06/05/2019   Finger deformity, left 02/10/2018   Hypertension, essential, benign 03/14/2017   Class 1 obesity with body mass index (BMI) of 31.0 to 31.9 in adult 03/14/2017   Plantar callus 03/14/2017   Osteoarthritis, hand 03/14/2017    REFERRING DIAG: M54.50 acute right sided low back pain   THERAPY DIAG:  Other low back pain  Muscle weakness  (generalized)  Rationale for Evaluation and Treatment Rehabilitation  PERTINENT HISTORY: Pronounced [name] -boor-rah At the end of May had surgery for heart blockage but when opened her up there was no blockage    PRECAUTIONS: None  SUBJECTIVE:                                                                                                                                                                                      SUBJECTIVE STATEMENT:  Some muscle soreness after last time but just because I was working those muscles.   PAIN:  Are you having pain? No  OBJECTIVE: (objective measures completed at initial evaluation unless otherwise dated)   DIAGNOSTIC FINDINGS:  X-rays DDD lumbar   PATIENT SURVEYS:  FOTO 77%   COGNITION: Overall cognitive status: Within functional limits for tasks assessed                              MUSCLE LENGTH: Hamstrings: Right 70 deg; Left 70 deg Thomas test: Right 10 deg; Left 10 deg   POSTURE: No Significant postural limitations   LUMBAR ROM:    AROM eval  Flexion Fingertips 6 inches from toes  Extension 15  Right lateral flexion 20  Left lateral flexion 20  Right rotation    Left rotation     (Blank rows = not tested)   TRUNK STRENGTH:  Decreased activation of transverse abdominus muscles; abdominals 4-/5; decreased activation of lumbar multifidi; trunk extensors 4-/5;  pain with bridge   LOWER EXTREMITY ROM:   WFLs   LOWER EXTREMITY MMT:  Pelvic drop with SLS;  needs mod assist of railings with 6 inch step down test right/left    MMT Right eval Left eval  Hip flexion 4 4  Hip extension 4 4  Hip abduction 4- 4-  Hip adduction      Hip internal rotation      Hip external rotation      Knee flexion      Knee extension 4- 4-  Ankle dorsiflexion      Ankle plantarflexion      Ankle inversion      Ankle eversion       (Blank rows = not tested)     FUNCTIONAL TESTS:  5 times sit to stand: 12 sec no UE use SLS 3-5  sec GAIT:   Comments: no impairment at slow or fast speed   TODAY'S TREATMENT:    08/31/22 Nustep L4 (new model)  6 min while discussing status  Supine left and right hip flexor stretch of side of bed with opp KTC 2x 30 sec  Supine bridge 5 sec hold 5x: VC to PPT first (flatten her back) then lift, VC to engage her core at the top before lowering S/L hip abduction: 10x Bil: VC/TC to correct technique to avoid  roll pelvis back and lift with hip flexors. Sit to stand 5# 10x Standing Rt hip flexor/gastroc stretch at second step with added UE elevation  Step ups Rt and left 10x, uses UE support. Crawl on/off the mat table 6x Getting up and down from the floor 2x Therapeutic activities:  sit to stand, standing, walking, negotiating curbs, steps, getting on/off the floor    08/29/22: Nustep L4 (new model)  5 min with PTA present   Supine bridge 3 sec hold 5x: VC to PPT first (flatten her back) then lift, VC to engage her core at th etop before lowering. S/L hip abduction: 5x Bil: VC/TC to correct technique. Pt want to roll pelvis back and lift with hip flexors. Standing Rt hip flexor/gastroc stretch at second step. Step ups Rt10x, uses UE support. Supine Rt hip flexor stretch off bed: given for HEP  DATE: 08/23/22      PATIENT EDUCATION:  Education details: plan of care Person educated: Patient Education method: Explanation Education comprehension: verbalized understanding   HOME EXERCISE PROGRAM: Access Code: [email or web address] URL: [email or web address] Date: 08/23/2022 Prepared by: Lavinia Sharps   Exercises - Sidelying Hip Abduction  - 1 x daily - 7 x weekly - 1 sets - 10 reps - Sit to Stand  - 1 x daily - 7 x weekly - 1 sets - 10 reps - Forward Step Up  - 1 x daily - 7 x weekly - 1 sets - 10 reps Added 08/29/22: RT hip flexor stretch supine and or  standing at step    ASSESSMENT:   CLINICAL IMPRESSION:  The patient is able to progress exercise intensity (reps and load) without production of pain.  Better technique overall with ex's.  Verbal cues for proper recruitment of glute medius muscle activation and avoid compensatory strategies.  Some minor discomfort reported in her back with step ups but remains low in intensity and able to continue.  Patient is quite pleased that she can get up off the floor 2x today without pulling up.     OBJECTIVE IMPAIRMENTS: decreased activity tolerance, decreased ROM, decreased strength, increased fascial restrictions, and pain.    ACTIVITY LIMITATIONS: stairs and transfers   PARTICIPATION LIMITATIONS: occupation   PERSONAL FACTORS:  good health  are also affecting patient's functional outcome.    REHAB POTENTIAL: Good   CLINICAL DECISION MAKING: Stable/uncomplicated   EVALUATION COMPLEXITY: Low     GOALS: Goals reviewed with patient? Yes   SHORT TERM GOALS: Target date: 09/20/2022   The patient will demonstrate knowledge of basic self care strategies and exercises to promote healing   Baseline: Goal status: INITIAL   2.  The patient will have improved hip strength to at least 4/5 needed for getting up and down from the floor and descending stairs at home and in the community  Baseline:  Goal status: INITIAL   3.  The patient will have improved trunk flexor and extensor muscle strength to at least 4/5 needed for lifting light to medium weight objects   Baseline:  Goal status: INITIAL       LONG TERM GOALS: Target date: 10/18/2022   The patient will be independent in a safe self progression of a home exercise program to promote further recovery of function   Baseline:  Goal status: INITIAL   2.  The patient will report minimal to no back pain with functional activities including work duties Baseline:  Goal status: INITIAL   3.  The patient will have improved trunk flexor and  extensor muscle strength to at least 4+/5 needed for lifting medium weight objects such as  laundry and luggage  Baseline:  Goal status: INITIAL   4.  The patient will have improved hip strength to at least 4+/5 needed for getting up off the floor and descending stairs at home and in the community  Baseline:  Goal status: INITIAL   5.  The patient will have improved FOTO score to   86%    indicating improved function with less pain  Baseline:  Goal status: INITIAL       PLAN:   PT FREQUENCY: 1-2x/week   PT DURATION: 8 weeks   PLANNED INTERVENTIONS: Therapeutic exercises, Therapeutic activity, Neuromuscular re-education, Gait training, Patient/Family education, Self Care, Joint mobilization, Aquatic Therapy, Dry Needling, Electrical stimulation, Spinal manipulation, Spinal mobilization, Cryotherapy, Moist heat, Taping, Traction, Ultrasound, Ionotophoresis 4mg /ml Dexamethasone, Manual  therapy, and Re-evaluation.   PLAN FOR NEXT SESSION:   LE flexibility, lumbo/pelvic/hip strengthening; step ups, floor transfer practice;  going to [place] in December  Lavinia Sharps, PT 08/31/22 1:10 PM Phone: [phone] Fax: [phone]

## 2022-09-16 ENCOUNTER — Ambulatory Visit: Payer: No Typology Code available for payment source | Attending: Family Medicine | Admitting: Physical Therapy

## 2022-09-16 DIAGNOSIS — M5459 Other low back pain: Secondary | ICD-10-CM | POA: Insufficient documentation

## 2022-09-16 DIAGNOSIS — M6281 Muscle weakness (generalized): Secondary | ICD-10-CM | POA: Insufficient documentation

## 2022-09-16 NOTE — Therapy (Signed)
OUTPATIENT PHYSICAL THERAPY TREATMENT NOTE   Patient Name: Robin Martinez MRN: [id number] DOB:[birth year], 71 y.o., female Today's Date: 09/16/2022  PCP: [place], Betty MD  REFERRING PROVIDER: [place], Betty MD   END OF SESSION:   PT End of Session - 09/16/22 1107     Visit Number 4    Date for PT Re-Evaluation 10/18/22    PT Start Time 1107    PT Stop Time 1145    PT Time Calculation (min) 38 min    Activity Tolerance Patient tolerated treatment well                  Past Medical History:  Diagnosis Date   Allergy    Arthritis    GERD (gastroesophageal reflux disease)    Hypertension    Past Surgical History:  Procedure Laterality Date   BREAST SURGERY     biopsy   ENDOVENOUS ABLATION SAPHENOUS VEIN W/ LASER Right 12/12/2018   endovenous laser ablation right greater saphenous vein by Ruta Hinds MD   RIGHT/LEFT HEART CATH AND CORONARY ANGIOGRAPHY N/A 03/10/2022   Procedure: RIGHT/LEFT HEART CATH AND CORONARY ANGIOGRAPHY;  Surgeon: Burnell Blanks, MD;  Location: [facility];  Service: Cardiovascular;  Laterality: N/A;   stab phlebectomy  Right 09/11/2019   stab phlebectomy > 20 incisions right leg by Ruta Hinds MD    TEE WITHOUT CARDIOVERSION N/A 03/10/2022   Procedure: TRANSESOPHAGEAL ECHOCARDIOGRAM (TEE);  Surgeon: Janina Mayo, MD;  Location: [facility] ENDOSCOPY;  Service: Cardiovascular;  Laterality: N/A;   Patient Active Problem List   Diagnosis Date Noted   Nonrheumatic mitral valve regurgitation    Prediabetes 11/15/2021   GERD (gastroesophageal reflux disease) 11/15/2021   Hyperlipidemia 06/05/2019   Finger deformity, left 02/10/2018   Hypertension, essential, benign 03/14/2017   Class 1 obesity with body mass index (BMI) of 31.0 to 31.9 in adult 03/14/2017   Plantar callus 03/14/2017   Osteoarthritis, hand 03/14/2017    REFERRING DIAG: M54.50 acute right sided low back pain   THERAPY DIAG:  Other low back pain  Muscle weakness  (generalized)  Rationale for Evaluation and Treatment Rehabilitation  PERTINENT HISTORY: Pronounced [name] -boor-rah At the end of May had surgery for heart blockage but when opened her up there was no blockage    PRECAUTIONS: None  SUBJECTIVE:                                                                                                                                                                                      SUBJECTIVE STATEMENT:  The only ex that bothers me is sidelying hip abduction.  I told my husband about getting  up off the floor.    Has 5# and 8# weights at home  Are you having pain? No   OBJECTIVE: (objective measures completed at initial evaluation unless otherwise dated)   DIAGNOSTIC FINDINGS:  X-rays DDD lumbar   PATIENT SURVEYS:  FOTO 77%   COGNITION: Overall cognitive status: Within functional limits for tasks assessed                              MUSCLE LENGTH: Hamstrings: Right 70 deg; Left 70 deg Thomas test: Right 10 deg; Left 10 deg   POSTURE: No Significant postural limitations   LUMBAR ROM:    AROM eval  Flexion Fingertips 6 inches from toes  Extension 15  Right lateral flexion 20  Left lateral flexion 20  Right rotation    Left rotation     (Blank rows = not tested)   TRUNK STRENGTH:  Decreased activation of transverse abdominus muscles; abdominals 4-/5; decreased activation of lumbar multifidi; trunk extensors 4-/5;  pain with bridge   LOWER EXTREMITY ROM:   WFLs   LOWER EXTREMITY MMT:  Pelvic drop with SLS;  needs mod assist of railings with 6 inch step down test right/left    MMT Right eval Left eval  Hip flexion 4 4  Hip extension 4 4  Hip abduction 4- 4-  Hip adduction      Hip internal rotation      Hip external rotation      Knee flexion      Knee extension 4- 4-  Ankle dorsiflexion      Ankle plantarflexion      Ankle inversion      Ankle eversion       (Blank rows = not tested)     FUNCTIONAL TESTS:  5  times sit to stand: 12 sec no UE use SLS 3-5 sec GAIT:   Comments: no impairment at slow or fast speed   TODAY'S TREATMENT:    12/8: Doorway stretch 3x5 right/left Floor slider hip abduction 10x right/left  Step ups Rt and left 10x, no UE support needed today Up and down steps 2x  Staggered stance green band pallof press out and stir the pot 2 8# dumbbells dead lifts "putting on your pants" cue Sit to stand holding 8# and overhead press 10x Nu-Step 6 min L3 Getting up and down from the floor 2x Therapeutic activities:  sit to stand, standing, walking, negotiating curbs, steps, getting on/off the floor      08/31/22 Nustep L4 (new model)  6 min while discussing status  Supine left and right hip flexor stretch of side of bed with opp KTC 2x 30 sec  Supine bridge 5 sec hold 5x: VC to PPT first (flatten her back) then lift, VC to engage her core at the top before lowering S/L hip abduction: 10x Bil: VC/TC to correct technique to avoid  roll pelvis back and lift with hip flexors. Sit to stand 5# 10x Standing Rt hip flexor/gastroc stretch at second step with added UE elevation  Step ups Rt and left 10x, uses UE support. Crawl on/off the mat table 6x Getting up and down from the floor 2x Therapeutic activities:  sit to stand, standing, walking, negotiating curbs, steps, getting on/off the floor    08/29/22: Nustep L4 (new model)  5 min with PTA present   Supine bridge 3 sec hold 5x: VC to PPT first (flatten her back) then lift, VC  to engage her core at th etop before lowering. S/L hip abduction: 5x Bil: VC/TC to correct technique. Pt want to roll pelvis back and lift with hip flexors. Standing Rt hip flexor/gastroc stretch at second step. Step ups Rt10x, uses UE support. Supine Rt hip flexor stretch off bed: given for HEP                                                                                                                           DATE: 08/23/22      PATIENT  EDUCATION:  Education details: plan of care Person educated: Patient Education method: Explanation Education comprehension: verbalized understanding   HOME EXERCISE PROGRAM: Access Code: [email or web address] URL: [email or web address] Date: 09/16/2022 Prepared by: Ruben Im  Exercises - Sidelying Hip Abduction  - 1 x daily - 7 x weekly - 1 sets - 10 reps - Sit to Stand  - 1 x daily - 7 x weekly - 1 sets - 10 reps - Forward Step Up  - 1 x daily - 7 x weekly - 1 sets - 10 reps - Modified Thomas Stretch  - 1 x daily - 7 x weekly - 3 reps - 30 hold - Anti-Rotation Press With Sidesteps and Anchored Resistance  - 1 x daily - 7 x weekly - 1 sets - 10 reps - SNATCH AND PRESS OVERHEAD  - 1 x daily - 7 x weekly - 1 sets - 10 reps Given green band for home    ASSESSMENT:   CLINICAL IMPRESSION:  Therapist progressing and updating HEP for increased intensity and challenge level for further strengthening and functional mobility.   Improving trunk and LE strength.  Now able to go up and down steps without UE assist.  Able to get on/off the floor without pulling up from furniture.       OBJECTIVE IMPAIRMENTS: decreased activity tolerance, decreased ROM, decreased strength, increased fascial restrictions, and pain.    ACTIVITY LIMITATIONS: stairs and transfers   PARTICIPATION LIMITATIONS: occupation   PERSONAL FACTORS:  good health  are also affecting patient's functional outcome.    REHAB POTENTIAL: Good   CLINICAL DECISION MAKING: Stable/uncomplicated   EVALUATION COMPLEXITY: Low     GOALS: Goals reviewed with patient? Yes   SHORT TERM GOALS: Target date: 09/20/2022   The patient will demonstrate knowledge of basic self care strategies and exercises to promote healing   Baseline: Goal status: INITIAL   2.  The patient will have improved hip strength to at least 4/5 needed for getting up and down from the floor and descending stairs at home and in the community  Baseline:   Goal status: INITIAL   3.  The patient will have improved trunk flexor and extensor muscle strength to at least 4/5 needed for lifting light to medium weight objects   Baseline:  Goal status: INITIAL       LONG TERM GOALS: Target date: 10/18/2022   The patient will be independent in a  safe self progression of a home exercise program to promote further recovery of function   Baseline:  Goal status: INITIAL   2.  The patient will report minimal to no back pain with functional activities including work duties Baseline:  Goal status: INITIAL   3.  The patient will have improved trunk flexor and extensor muscle strength to at least 4+/5 needed for lifting medium weight objects such as  laundry and luggage  Baseline:  Goal status: INITIAL   4.  The patient will have improved hip strength to at least 4+/5 needed for getting up off the floor and descending stairs at home and in the community  Baseline:  Goal status: INITIAL   5.  The patient will have improved FOTO score to   86%    indicating improved function with less pain  Baseline:  Goal status: INITIAL       PLAN:   PT FREQUENCY: 1-2x/week   PT DURATION: 8 weeks   PLANNED INTERVENTIONS: Therapeutic exercises, Therapeutic activity, Neuromuscular re-education, Gait training, Patient/Family education, Self Care, Joint mobilization, Aquatic Therapy, Dry Needling, Electrical stimulation, Spinal manipulation, Spinal mobilization, Cryotherapy, Moist heat, Taping, Traction, Ultrasound, Ionotophoresis 4mg /ml Dexamethasone, Manual therapy, and Re-evaluation.   PLAN FOR NEXT SESSION:   LE flexibility, lumbo/pelvic/hip strengthening; step ups, floor transfer practice;  going to in December  January, PT 09/16/22 11:53 AM Phone: [phone] Fax: [phone]

## 2022-09-19 ENCOUNTER — Encounter: Admitting: Physical Therapy

## 2022-09-20 ENCOUNTER — Ambulatory Visit: Payer: No Typology Code available for payment source | Admitting: Physical Therapy

## 2022-09-20 DIAGNOSIS — M6281 Muscle weakness (generalized): Secondary | ICD-10-CM

## 2022-09-20 DIAGNOSIS — M5459 Other low back pain: Secondary | ICD-10-CM

## 2022-09-20 NOTE — Therapy (Signed)
OUTPATIENT PHYSICAL THERAPY TREATMENT NOTE   Patient Name: Robin Martinez MRN: [id number] DOB:1950/12/23, 71 y.o., female Today's Date: 09/20/2022  PCP: Martinique, Betty MD  REFERRING PROVIDER: Martinique, Betty MD   END OF SESSION:   PT End of Session - 09/20/22 1358     Visit Number 5    Date for PT Re-Evaluation 10/18/22    Authorization Type [facility]    PT Start Time 1400    PT Stop Time 1440    PT Time Calculation (min) 40 min    Activity Tolerance Patient tolerated treatment well                  Past Medical History:  Diagnosis Date   Allergy    Arthritis    GERD (gastroesophageal reflux disease)    Hypertension    Past Surgical History:  Procedure Laterality Date   BREAST SURGERY     biopsy   ENDOVENOUS ABLATION SAPHENOUS VEIN W/ LASER Right 12/12/2018   endovenous laser ablation right greater saphenous vein by Ruta Hinds MD   RIGHT/LEFT HEART CATH AND CORONARY ANGIOGRAPHY N/A 03/10/2022   Procedure: RIGHT/LEFT HEART CATH AND CORONARY ANGIOGRAPHY;  Surgeon: Burnell Blanks, MD;  Location: [facility];  Service: Cardiovascular;  Laterality: N/A;   stab phlebectomy  Right 09/11/2019   stab phlebectomy > 20 incisions right leg by Ruta Hinds MD    TEE WITHOUT CARDIOVERSION N/A 03/10/2022   Procedure: TRANSESOPHAGEAL ECHOCARDIOGRAM (TEE);  Surgeon: Janina Mayo, MD;  Location: [facility];  Service: Cardiovascular;  Laterality: N/A;   Patient Active Problem List   Diagnosis Date Noted   Nonrheumatic mitral valve regurgitation    Prediabetes 11/15/2021   GERD (gastroesophageal reflux disease) 11/15/2021   Hyperlipidemia 06/05/2019   Finger deformity, left 02/10/2018   Hypertension, essential, benign 03/14/2017   Class 1 obesity with body mass index (BMI) of 31.0 to 31.9 in adult 03/14/2017   Plantar callus 03/14/2017   Osteoarthritis, hand 03/14/2017    REFERRING DIAG: M54.50 acute right sided low back pain   THERAPY DIAG:  Other low  back pain  Muscle weakness (generalized)  Rationale for Evaluation and Treatment Rehabilitation  PERTINENT HISTORY: Pronounced [name] -boor-rah At the end of May had surgery for heart blockage but when opened her up there was no blockage    PRECAUTIONS: None  SUBJECTIVE:                                                                                                                                                                                      SUBJECTIVE STATEMENT:   No pain after last session.  I feel so much  better since I've been coming here I like it.  I can get up off the floor and climb up stairs without pulling myself up.  Going to [place].     Has 5# and 8# weights at home  Are you having pain? No   OBJECTIVE: (objective measures completed at initial evaluation unless otherwise dated)   DIAGNOSTIC FINDINGS:  X-rays DDD lumbar   PATIENT SURVEYS:  FOTO 77%   COGNITION: Overall cognitive status: Within functional limits for tasks assessed                              MUSCLE LENGTH: Hamstrings: Right 70 deg; Left 70 deg Thomas test: Right 10 deg; Left 10 deg   POSTURE: No Significant postural limitations   LUMBAR ROM:    AROM eval 12/12  Flexion Fingertips 6 inches from toes 3 inches from toes  Extension 15 20  Right lateral flexion 20 25  Left lateral flexion 20 25  Right rotation     Left rotation      (Blank rows = not tested)   TRUNK STRENGTH:  Decreased activation of transverse abdominus muscles; abdominals 4-/5; decreased activation of lumbar multifidi; trunk extensors 4-/5;  pain with bridge   LOWER EXTREMITY ROM:   WFLs   LOWER EXTREMITY MMT:  Pelvic drop with SLS;  needs mod assist of railings with 6 inch step down test right/left    MMT Right eval Left eval 12/12  Hip flexion 4 4 4+  Hip extension 4 4 4+  Hip abduction 4- 4- 4  Hip adduction       Hip internal rotation       Hip external rotation       Knee flexion       Knee  extension 4- 4- 4+  Ankle dorsiflexion       Ankle plantarflexion       Ankle inversion       Ankle eversion        (Blank rows = not tested)     FUNCTIONAL TESTS:  5 times sit to stand: 12 sec no UE use SLS 3-5 sec  12/12:  7 sec right/left  GAIT:   Comments: no impairment at slow or fast speed   TODAY'S TREATMENT:    12/12: Nu-Step 10 min L1 while discussing status/progress 2nd step stretch with UE elevation and reach overhead 10x Lumbar ROM SLS  6 inch step ups holding 5# 2 sets of 5 right/left 2 5# dumbbells dead lifts "putting on your pants" cue 10x 5# snatch/press overhead 12x right/left Lat bar 30# standing 15x Squats holding 10# at chest level 12x Leg press 40# 20x right/left (left LE more difficult)  Therapeutic activities:  sit to stand, standing, walking, negotiating curbs, steps, getting on/off the floor      12/8: Doorway stretch 3x5 right/left Floor slider hip abduction 10x right/left  Step ups Rt and left 10x, no UE support needed today Up and down steps 2x  Staggered stance green band pallof press out and stir the pot 2 8# dumbbells dead lifts "putting on your pants" cue Sit to stand holding 8# and overhead press 10x Nu-Step 6 min L3 Getting up and down from the floor 2x Therapeutic activities:  sit to stand, standing, walking, negotiating curbs, steps, getting on/off the floor      08/31/22 Nustep L4 (new model)  6 min while discussing status  Supine left and right  hip flexor stretch of side of bed with opp KTC 2x 30 sec  Supine bridge 5 sec hold 5x: VC to PPT first (flatten her back) then lift, VC to engage her core at the top before lowering S/L hip abduction: 10x Bil: VC/TC to correct technique to avoid  roll pelvis back and lift with hip flexors. Sit to stand 5# 10x Standing Rt hip flexor/gastroc stretch at second step with added UE elevation  Step ups Rt and left 10x, uses UE support. Crawl on/off the mat table 6x Getting up and down  from the floor 2x Therapeutic activities:  sit to stand, standing, walking, negotiating curbs, steps, getting on/off the floor    PATIENT EDUCATION:  Education details: plan of care Person educated: Patient Education method: Explanation Education comprehension: verbalized understanding   HOME EXERCISE PROGRAM: Access Code: [email or web address] URL: [email or web address] Date: 09/16/2022 Prepared by: Ruben Im  Exercises - Sidelying Hip Abduction  - 1 x daily - 7 x weekly - 1 sets - 10 reps - Sit to Stand  - 1 x daily - 7 x weekly - 1 sets - 10 reps - Forward Step Up  - 1 x daily - 7 x weekly - 1 sets - 10 reps - Modified Thomas Stretch  - 1 x daily - 7 x weekly - 3 reps - 30 hold - Anti-Rotation Press With Sidesteps and Anchored Resistance  - 1 x daily - 7 x weekly - 1 sets - 10 reps - SNATCH AND PRESS OVERHEAD  - 1 x daily - 7 x weekly - 1 sets - 10 reps Given green band for home    ASSESSMENT:   CLINICAL IMPRESSION:  Progressing well with all STGs met.  Good improvements with lumbar ROM and SLS times.  The patient has been less symptomatic over past 2 weeks suggesting improved pain control as strength progresses. Patient able to progress weights/resistance in nearly all exercises today without difficulty or exacerbation of pain.   She will have access to gym equipment while in [place] and we discussed 3 options for equipment she could use.  She reports she "usually runs from these machines" but notes they feel good when she using them in the clinic today.        OBJECTIVE IMPAIRMENTS: decreased activity tolerance, decreased ROM, decreased strength, increased fascial restrictions, and pain.    ACTIVITY LIMITATIONS: stairs and transfers   PARTICIPATION LIMITATIONS: occupation   PERSONAL FACTORS:  good health  are also affecting patient's functional outcome.    REHAB POTENTIAL: Good   CLINICAL DECISION MAKING: Stable/uncomplicated   EVALUATION COMPLEXITY: Low      GOALS: Goals reviewed with patient? Yes   SHORT TERM GOALS: Target date: 09/20/2022   The patient will demonstrate knowledge of basic self care strategies and exercises to promote healing   Baseline: Goal status:goal met 12/12   2.  The patient will have improved hip strength to at least 4/5 needed for getting up and down from the floor and descending stairs at home and in the community  Baseline:  Goal status: goal met 12/12   3.  The patient will have improved trunk flexor and extensor muscle strength to at least 4/5 needed for lifting light to medium weight objects   Baseline:  Goal status: goal met 12/12       LONG TERM GOALS: Target date: 10/18/2022   The patient will be independent in a safe self progression of a home exercise program to promote further recovery of  function   Baseline:  Goal status: INITIAL   2.  The patient will report minimal to no back pain with functional activities including work duties Baseline:  Goal status: INITIAL   3.  The patient will have improved trunk flexor and extensor muscle strength to at least 4+/5 needed for lifting medium weight objects such as  laundry and luggage  Baseline:  Goal status: INITIAL   4.  The patient will have improved hip strength to at least 4+/5 needed for getting up off the floor and descending stairs at home and in the community  Baseline:  Goal status: INITIAL   5.  The patient will have improved FOTO score to   86%    indicating improved function with less pain  Baseline:  Goal status: INITIAL       PLAN:   PT FREQUENCY: 1-2x/week   PT DURATION: 8 weeks   PLANNED INTERVENTIONS: Therapeutic exercises, Therapeutic activity, Neuromuscular re-education, Gait training, Patient/Family education, Self Care, Joint mobilization, Aquatic Therapy, Dry Needling, Electrical stimulation, Spinal manipulation, Spinal mobilization, Cryotherapy, Moist heat, Taping, Traction, Ultrasound, Ionotophoresis 59m/ml  Dexamethasone, Manual therapy, and Re-evaluation.   PLAN FOR NEXT SESSION: [place];    LE flexibility, lumbo/pelvic/hip strengthening; step ups, floor transfer practice   SRuben Im PT 09/20/22 2:49 PM Phone: [phone]: [phone]

## 2022-10-11 ENCOUNTER — Ambulatory Visit: Payer: No Typology Code available for payment source | Admitting: Physical Therapy

## 2022-10-12 ENCOUNTER — Ambulatory Visit: Payer: No Typology Code available for payment source | Admitting: Physical Therapy

## 2022-10-12 ENCOUNTER — Ambulatory Visit: Payer: No Typology Code available for payment source | Attending: Family Medicine | Admitting: Physical Therapy

## 2022-10-12 DIAGNOSIS — M6281 Muscle weakness (generalized): Secondary | ICD-10-CM | POA: Insufficient documentation

## 2022-10-12 DIAGNOSIS — M5459 Other low back pain: Secondary | ICD-10-CM | POA: Diagnosis not present

## 2022-10-12 NOTE — Progress Notes (Unsigned)
HPI:  Robin Martinez is a 72 y.o. female with past medical history significant for prediabetes, hypertension, hyperlipidemia, OA, nontraumatic mitral valve regurgitation, and GERD who is here today to follow on recent covid.  after recovering from COVID-19, which occurred over 20 days ago. She reports no current symptoms and states that all symptoms have resolved. The primary reason for the visit is to obtain a return-to-work note for her employer.  She also requests a refill of her high blood pressure medication, triamterene-hydrochlorothiazide 37.5/25 mg daily. She reports having had her kidney function last checked in May and has a scheduled appointment with her cardiologist in March. The patient has a history of borderline diabetes.  The patient works in intake and has no concerns other than obtaining the return-to-work note and medication refill. She reports having an annual eye exam and recently received new glasses. Hypertension:  Medications:*** BP readings at home:*** Side effects:***  Negative for unusual or severe headache, visual changes, exertional chest pain, dyspnea,  focal weakness, or edema.  Lab Results  Component Value Date   CREATININE 0.92 03/08/2022   BUN 17 03/08/2022   NA 147 (H) 03/10/2022   K 3.0 (L) 03/10/2022   CL 104 03/08/2022   CO2 27 03/08/2022    Lab Results  Component Value Date   HGBA1C 6.0 11/15/2021    Review of Systems See other pertinent positives and negatives in HPI.  Current Outpatient Medications on File Prior to Visit  Medication Sig Dispense Refill   aspirin EC 81 MG tablet Take 81 mg by mouth daily. Swallow whole.     cholecalciferol (VITAMIN D3) 25 MCG (1000 UNIT) tablet Take 1,000 Units by mouth daily.     Cyanocobalamin (VITAMIN B-12) 5000 MCG TBDP Take 5,000 mcg by mouth daily.     Flaxseed, Linseed, (FLAXSEED OIL) 1000 MG CAPS Take 1,000 mg by mouth daily.     Magnesium 100 MG CAPS Take 1 capsule by mouth daily as needed.      vitamin E 180 MG (400 UNITS) capsule Take 400 Units by mouth daily.     No current facility-administered medications on file prior to visit.    Past Medical History:  Diagnosis Date   Allergy    Arthritis    GERD (gastroesophageal reflux disease)    Hypertension    No Known Allergies  Social History   Socioeconomic History   Marital status: Married    Spouse name: Not on file   Number of children: Not on file   Years of education: Not on file   Highest education level: Not on file  Occupational History   Not on file  Tobacco Use   Smoking status: Never   Smokeless tobacco: Never  Substance and Sexual Activity   Alcohol use: No   Drug use: No   Sexual activity: Not Currently  Other Topics Concern   Not on file  Social History Narrative   Not on file   Social Determinants of Health   Financial Resource Strain: Low Risk  (06/01/2022)   Overall Financial Resource Strain (CARDIA)    Difficulty of Paying Living Expenses: Not hard at all  Food Insecurity: No Food Insecurity (06/01/2022)   Hunger Vital Sign    Worried About Running Out of Food in the Last Year: Never true    [place] in the Last Year: Never true  Transportation Needs: No Transportation Needs (06/01/2022)   PRAPARE - Hydrologist (Medical):  No    Lack of Transportation (Non-Medical): No  Physical Activity: Insufficiently Active (06/01/2022)   Exercise Vital Sign    Days of Exercise per Week: 3 days    Minutes of Exercise per Session: 30 min  Stress: No Stress Concern Present (06/01/2022)   [place]    Feeling of Stress : Not at all  Social Connections: [place] (06/01/2022)   Social Connection and Isolation Panel [NHANES]    Frequency of Communication with Friends and Family: More than three times a week    Frequency of Social Gatherings with Friends and Family: More than three times a  week    Attends Religious Services: More than 4 times per year    Active Member of [employer] or Organizations: Yes    Attends Archivist Meetings: More than 4 times per year    Marital Status: Married   Vitals:   10/14/22 1356  BP: 130/80  Pulse: 75  Resp: 16  Temp: 97.9 F (36.6 C)  SpO2: 98%   Body mass index is 33.15 kg/m.  Physical Exam Vitals and nursing note reviewed.  Constitutional:      General: She is not in acute distress.    Appearance: She is well-developed.  HENT:     Head: Normocephalic and atraumatic.     Mouth/Throat:     Mouth: Mucous membranes are moist.     Pharynx: Oropharynx is clear.  Eyes:     Conjunctiva/sclera: Conjunctivae normal.  Cardiovascular:     Rate and Rhythm: Normal rate and regular rhythm.     Pulses:          Posterior tibial pulses are 2+ on the right side and 2+ on the left side.     Heart sounds: No murmur heard. Pulmonary:     Effort: Pulmonary effort is normal. No respiratory distress.     Breath sounds: Normal breath sounds.  Abdominal:     Palpations: Abdomen is soft. There is no hepatomegaly or mass.     Tenderness: There is no abdominal tenderness.  Lymphadenopathy:     Cervical: No cervical adenopathy.  Skin:    General: Skin is warm.     Findings: No erythema or rash.  Neurological:     General: No focal deficit present.     Mental Status: She is alert and oriented to person, place, and time.     Cranial Nerves: No cranial nerve deficit.     Gait: Gait normal.  Psychiatric:     Comments: Well groomed, good eye contact.   ASSESSMENT AND PLAN:  Ms.Manjit was seen today for follow-up.  Diagnoses and all orders for this visit:  Hypertension, essential, benign -     triamterene-hydrochlorothiazide (MAXZIDE-25) 37.5-25 MG tablet; Take 1 tablet by mouth daily. Appointment due -     Basic metabolic panel; Future -     Basic metabolic panel  Counseled about COVID-19 virus infection  Prediabetes -      Hemoglobin A1c; Future -     Hemoglobin A1c    Orders Placed This Encounter  Procedures   Basic metabolic panel   Hemoglobin A1c    Hypertension, essential, benign BP adequately controlled. Continue Triamterene-HCTZ 37.5-25 mg daily and low salt diet. Some side effects of medications discussed. Eye exam is current.  Prediabetes Last HgA1C was 6.0 in 11/2021. Continue a healthy life style for diabetes prevention. Further recommendations according to HgA1C result.   Return in  about 6 months (around 04/14/2023) for CPE.  Favour Aleshire G. Martinique, MD  [facility]. [place] office.

## 2022-10-12 NOTE — Therapy (Signed)
OUTPATIENT PHYSICAL THERAPY TREATMENT NOTE   Patient Name: Robin Martinez MRN: [id number] DOB:1951-08-30, 72 y.o., female Today's Date: 10/12/2022  PCP: Martinique, Betty MD  REFERRING PROVIDER: Martinique, Betty MD   END OF SESSION:   PT End of Session - 10/12/22 1105     Visit Number 6    Date for PT Re-Evaluation 10/18/22    Authorization Type [facility]    PT Start Time 1104    PT Stop Time 1144    PT Time Calculation (min) 40 min    Activity Tolerance Patient tolerated treatment well                  Past Medical History:  Diagnosis Date   Allergy    Arthritis    GERD (gastroesophageal reflux disease)    Hypertension    Past Surgical History:  Procedure Laterality Date   BREAST SURGERY     biopsy   ENDOVENOUS ABLATION SAPHENOUS VEIN W/ LASER Right 12/12/2018   endovenous laser ablation right greater saphenous vein by Ruta Hinds MD   RIGHT/LEFT HEART CATH AND CORONARY ANGIOGRAPHY N/A 03/10/2022   Procedure: RIGHT/LEFT HEART CATH AND CORONARY ANGIOGRAPHY;  Surgeon: Burnell Blanks, MD;  Location: [facility];  Service: Cardiovascular;  Laterality: N/A;   stab phlebectomy  Right 09/11/2019   stab phlebectomy > 20 incisions right leg by Ruta Hinds MD    TEE WITHOUT CARDIOVERSION N/A 03/10/2022   Procedure: TRANSESOPHAGEAL ECHOCARDIOGRAM (TEE);  Surgeon: Janina Mayo, MD;  Location: [facility] ENDOSCOPY;  Service: Cardiovascular;  Laterality: N/A;   Patient Active Problem List   Diagnosis Date Noted   Nonrheumatic mitral valve regurgitation    Prediabetes 11/15/2021   GERD (gastroesophageal reflux disease) 11/15/2021   Hyperlipidemia 06/05/2019   Finger deformity, left 02/10/2018   Hypertension, essential, benign 03/14/2017   Class 1 obesity with body mass index (BMI) of 31.0 to 31.9 in adult 03/14/2017   Plantar callus 03/14/2017   Osteoarthritis, hand 03/14/2017    REFERRING DIAG: M54.50 acute right sided low back pain   THERAPY DIAG:  Other low back  pain  Muscle weakness (generalized)  Rationale for Evaluation and Treatment Rehabilitation  PERTINENT HISTORY: Pronounced [name] -boor-rah At the end of May had surgery for heart blockage but when opened her up there was no blockage    PRECAUTIONS: None  SUBJECTIVE:                                                                                                                                                                                      SUBJECTIVE STATEMENT:   My back hurt for days after traveling to [place]. Better  now.   I worked out 3 times at [employer], walked 3 times too.   I did get down on the floor and got back up.  I was able to carry my plate of food up/down stairs.  Has 5# and 8# weights at home  Are you having pain? No   OBJECTIVE: (objective measures completed at initial evaluation unless otherwise dated)   DIAGNOSTIC FINDINGS:  X-rays DDD lumbar   PATIENT SURVEYS:  FOTO 77%   COGNITION: Overall cognitive status: Within functional limits for tasks assessed                              MUSCLE LENGTH: Hamstrings: Right 70 deg; Left 70 deg Thomas test: Right 10 deg; Left 10 deg   POSTURE: No Significant postural limitations   LUMBAR ROM:    AROM eval 12/12  Flexion Fingertips 6 inches from toes 3 inches from toes  Extension 15 20  Right lateral flexion 20 25  Left lateral flexion 20 25  Right rotation     Left rotation      (Blank rows = not tested)   TRUNK STRENGTH:  Decreased activation of transverse abdominus muscles; abdominals 4-/5; decreased activation of lumbar multifidi; trunk extensors 4-/5;  pain with bridge   LOWER EXTREMITY ROM:   WFLs   LOWER EXTREMITY MMT:  Pelvic drop with SLS;  needs mod assist of railings with 6 inch step down test right/left    MMT Right eval Left eval 12/12  Hip flexion 4 4 4+  Hip extension 4 4 4+  Hip abduction 4- 4- 4  Hip adduction       Hip internal rotation       Hip external rotation        Knee flexion       Knee extension 4- 4- 4+  Ankle dorsiflexion       Ankle plantarflexion       Ankle inversion       Ankle eversion        (Blank rows = not tested)     FUNCTIONAL TESTS:  5 times sit to stand: 12 sec no UE use SLS 3-5 sec  12/12:  7 sec right/left  GAIT:   Comments: no impairment at slow or fast speed   TODAY'S TREATMENT:     10/12/22: Nu-Step 8 min L3 while discussing status/progress 2nd step stretch with UE elevation and reach overhead 10x 6 inch step ups holding 5# 20x right/left (no hands needed for balance) Staggered stance (back leg is a kickstand for balance) red band stir the pot  10x each direction 2 5# dumbbells dead lifts "putting on your pants" cue 10x 5# snatch/press overhead 10x right/left Resisted cable walk 20# backwards only 5x Lat bar 30# standing 20x Hip machine 40# abduction, extension and flexion 10x each right/left  Therapeutic activities:  sit to stand, standing, walking, negotiating curbs, steps, getting on/off the floor       12/12: Nu-Step 10 min L1 while discussing status/progress 2nd step stretch with UE elevation and reach overhead 10x Lumbar ROM SLS  6 inch step ups holding 5# 2 sets of 5 right/left 2 5# dumbbells dead lifts "putting on your pants" cue 10x 5# snatch/press overhead 12x right/left Lat bar 30# standing 15x Squats holding 10# at chest level 12x Leg press 40# 20x right/left (left LE more difficult)  Therapeutic activities:  sit to stand, standing, walking, negotiating  curbs, steps, getting on/off the floor      12/8: Doorway stretch 3x5 right/left Floor slider hip abduction 10x right/left  Step ups Rt and left 10x, no UE support needed today Up and down steps 2x  Staggered stance green band pallof press out and stir the pot 2 8# dumbbells dead lifts "putting on your pants" cue Sit to stand holding 8# and overhead press 10x Nu-Step 6 min L3 Getting up and down from the floor 2x Therapeutic  activities:  sit to stand, standing, walking, negotiating curbs, steps, getting on/off the floor      PATIENT EDUCATION:  Education details: plan of care Person educated: Patient Education method: Explanation Education comprehension: verbalized understanding   HOME EXERCISE PROGRAM: Access Code: [email or web address] URL: [email or web address] Date: 09/16/2022 Prepared by: Ruben Im  Exercises - Sidelying Hip Abduction  - 1 x daily - 7 x weekly - 1 sets - 10 reps - Sit to Stand  - 1 x daily - 7 x weekly - 1 sets - 10 reps - Forward Step Up  - 1 x daily - 7 x weekly - 1 sets - 10 reps - Modified Thomas Stretch  - 1 x daily - 7 x weekly - 3 reps - 30 hold - Anti-Rotation Press With Sidesteps and Anchored Resistance  - 1 x daily - 7 x weekly - 1 sets - 10 reps - SNATCH AND PRESS OVERHEAD  - 1 x daily - 7 x weekly - 1 sets - 10 reps Given green band for home    ASSESSMENT:   CLINICAL IMPRESSION:  Emunah returns from her trip to [place].  She has been doing well overall while she was gone but did have a few days of back pain after the flight there.  Today she is able to resume core and LE strengthening.  She has weakness in left gluteals which causes some discomfort after single leg standing ex's on that side.  Verbal cues provided to avoid compensatory strategies and optimize ex benefit.      OBJECTIVE IMPAIRMENTS: decreased activity tolerance, decreased ROM, decreased strength, increased fascial restrictions, and pain.    ACTIVITY LIMITATIONS: stairs and transfers   PARTICIPATION LIMITATIONS: occupation   PERSONAL FACTORS:  good health  are also affecting patient's functional outcome.    REHAB POTENTIAL: Good   CLINICAL DECISION MAKING: Stable/uncomplicated   EVALUATION COMPLEXITY: Low     GOALS: Goals reviewed with patient? Yes   SHORT TERM GOALS: Target date: 09/20/2022   The patient will demonstrate knowledge of basic self care strategies and exercises to  promote healing   Baseline: Goal status:goal met 12/12   2.  The patient will have improved hip strength to at least 4/5 needed for getting up and down from the floor and descending stairs at home and in the community  Baseline:  Goal status: goal met 12/12   3.  The patient will have improved trunk flexor and extensor muscle strength to at least 4/5 needed for lifting light to medium weight objects   Baseline:  Goal status: goal met 12/12       LONG TERM GOALS: Target date: 10/18/2022   The patient will be independent in a safe self progression of a home exercise program to promote further recovery of function   Baseline:  Goal status: ongoing   2.  The patient will report minimal to no back pain with functional activities including work duties Baseline:  Goal status: ongoing   3.  The patient  will have improved trunk flexor and extensor muscle strength to at least 4+/5 needed for lifting medium weight objects such as  laundry and luggage  Baseline:  Goal status: ongoing   4.  The patient will have improved hip strength to at least 4+/5 needed for getting up off the floor and descending stairs at home and in the community  Baseline:  Goal status: ongoing   5.  The patient will have improved FOTO score to   86%    indicating improved function with less pain  Baseline:  Goal status: ongoing      PLAN:   PT FREQUENCY: 1-2x/week   PT DURATION: 8 weeks   PLANNED INTERVENTIONS: Therapeutic exercises, Therapeutic activity, Neuromuscular re-education, Gait training, Patient/Family education, Self Care, Joint mobilization, Aquatic Therapy, Dry Needling, Electrical stimulation, Spinal manipulation, Spinal mobilization, Cryotherapy, Moist heat, Taping, Traction, Ultrasound, Ionotophoresis 76m/ml Dexamethasone, Manual therapy, and Re-evaluation.   PLAN FOR NEXT SESSION: ERO;  FOTO;    LE flexibility, lumbo/pelvic/hip strengthening; step ups, floor transfer practice  SRuben Im  PT 10/12/22 11:42 AM Phone: [phone]: [phone]

## 2022-10-14 ENCOUNTER — Ambulatory Visit (INDEPENDENT_AMBULATORY_CARE_PROVIDER_SITE_OTHER): Payer: No Typology Code available for payment source | Admitting: Family Medicine

## 2022-10-14 ENCOUNTER — Encounter: Payer: Self-pay | Admitting: Family Medicine

## 2022-10-14 VITALS — BP 130/80 | HR 75 | Temp 97.9°F | Resp 16 | Ht 62.0 in | Wt 181.2 lb

## 2022-10-14 DIAGNOSIS — E876 Hypokalemia: Secondary | ICD-10-CM | POA: Insufficient documentation

## 2022-10-14 DIAGNOSIS — R7303 Prediabetes: Secondary | ICD-10-CM | POA: Diagnosis not present

## 2022-10-14 DIAGNOSIS — Z7189 Other specified counseling: Secondary | ICD-10-CM | POA: Diagnosis not present

## 2022-10-14 DIAGNOSIS — I10 Essential (primary) hypertension: Secondary | ICD-10-CM | POA: Diagnosis not present

## 2022-10-14 LAB — HEMOGLOBIN A1C: Hgb A1c MFr Bld: 5.9 % (ref 4.6–6.5)

## 2022-10-14 LAB — BASIC METABOLIC PANEL
BUN: 16 mg/dL (ref 6–23)
CO2: 31 mEq/L (ref 19–32)
Calcium: 9.6 mg/dL (ref 8.4–10.5)
Chloride: 104 mEq/L (ref 96–112)
Creatinine, Ser: 0.78 mg/dL (ref 0.40–1.20)
GFR: 77.41 mL/min (ref >60.00)
Glucose, Bld: 95 mg/dL (ref 70–99)
Potassium: 4.2 mEq/L (ref 3.5–5.1)
Sodium: 141 mEq/L (ref 135–145)

## 2022-10-14 MED ORDER — TRIAMTERENE-HCTZ 37.5-25 MG PO TABS: 1.0000 | ORAL_TABLET | Freq: Every day | ORAL | 2 refills | Status: DC

## 2022-10-14 NOTE — Assessment & Plan Note (Signed)
Last HgA1C was 6.0 in 11/2021. Continue a healthy life style for diabetes prevention. Further recommendations according to HgA1C result.

## 2022-10-14 NOTE — Assessment & Plan Note (Addendum)
BP adequately controlled. Continue Triamterene-HCTZ 37.5-25 mg daily and low salt diet. Some side effects of medications discussed. Eye exam is current.

## 2022-10-14 NOTE — Patient Instructions (Addendum)
A few things to remember from today's visit:  Hypertension, essential, benign - Plan: triamterene-hydrochlorothiazide (MAXZIDE-25) 37.5-25 MG tablet, Basic metabolic panel  You can go back to work. You do not need to repeat Covid test.  If you need refills for medications you take chronically, please call your pharmacy. Do not use My Chart to request refills or for acute issues that need immediate attention. If you send a my chart message, it may take a few days to be addressed, specially if I am not in the office.  Please be sure medication list is accurate. If a new problem present, please set up appointment sooner than planned today.

## 2022-10-15 NOTE — Assessment & Plan Note (Signed)
K+ 3.0 in 03/2022. If not yet resolved, we will need to either add KLOR 20 meq daily or change to a different antihypertensive medications. Continue potassium rich diet.

## 2022-10-17 ENCOUNTER — Encounter: Admitting: Physical Therapy

## 2022-10-26 ENCOUNTER — Ambulatory Visit: Payer: No Typology Code available for payment source | Admitting: Physical Therapy

## 2022-10-26 DIAGNOSIS — M5459 Other low back pain: Secondary | ICD-10-CM | POA: Diagnosis not present

## 2022-10-26 DIAGNOSIS — M6281 Muscle weakness (generalized): Secondary | ICD-10-CM

## 2022-10-26 NOTE — Therapy (Signed)
OUTPATIENT PHYSICAL THERAPY TREATMENT NOTE/RECERTIFICATION   Patient Name: Robin Martinez MRN: [id number] DOB:Nov 07, 1950, 72 y.o., female Today's Date: 10/26/2022  PCP: Martinique, Betty MD  REFERRING PROVIDER: Martinique, Betty MD   END OF SESSION:   PT End of Session - 10/26/22 1403     Visit Number 7    Date for PT Re-Evaluation 12/21/22    Authorization Type [facility]    PT Start Time 1400    PT Stop Time 1440    PT Time Calculation (min) 40 min    Activity Tolerance Patient tolerated treatment well                  Past Medical History:  Diagnosis Date   Allergy    Arthritis    GERD (gastroesophageal reflux disease)    Hypertension    Past Surgical History:  Procedure Laterality Date   BREAST SURGERY     biopsy   ENDOVENOUS ABLATION SAPHENOUS VEIN W/ LASER Right 12/12/2018   endovenous laser ablation right greater saphenous vein by Ruta Hinds MD   RIGHT/LEFT HEART CATH AND CORONARY ANGIOGRAPHY N/A 03/10/2022   Procedure: RIGHT/LEFT HEART CATH AND CORONARY ANGIOGRAPHY;  Surgeon: Burnell Blanks, MD;  Location: [facility];  Service: Cardiovascular;  Laterality: N/A;   stab phlebectomy  Right 09/11/2019   stab phlebectomy > 20 incisions right leg by Ruta Hinds MD    TEE WITHOUT CARDIOVERSION N/A 03/10/2022   Procedure: TRANSESOPHAGEAL ECHOCARDIOGRAM (TEE);  Surgeon: Janina Mayo, MD;  Location: [facility];  Service: Cardiovascular;  Laterality: N/A;   Patient Active Problem List   Diagnosis Date Noted   Hypokalemia 10/14/2022   Nonrheumatic mitral valve regurgitation    Prediabetes 11/15/2021   GERD (gastroesophageal reflux disease) 11/15/2021   Hyperlipidemia 06/05/2019   Finger deformity, left 02/10/2018   Hypertension, essential, benign 03/14/2017   Class 1 obesity with body mass index (BMI) of 31.0 to 31.9 in adult 03/14/2017   Plantar callus 03/14/2017   Osteoarthritis, hand 03/14/2017    REFERRING DIAG: M54.50 acute right sided low  back pain   THERAPY DIAG:  Other low back pain  Muscle weakness (generalized)  Rationale for Evaluation and Treatment Rehabilitation  PERTINENT HISTORY: Pronounced [name] -boor-rah At the end of May had surgery for heart blockage but when opened her up there was no blockage    PRECAUTIONS: None  SUBJECTIVE:                                                                                                                                                                                      SUBJECTIVE STATEMENT:   Started a new job.  My  leg was bothering me I think from too much sitting.  I carried a plate of food up the stairs without thinking about it.  I'm getting better.     Has 5# and 8# weights at home  Are you having pain? No   OBJECTIVE: (objective measures completed at initial evaluation unless otherwise dated)   DIAGNOSTIC FINDINGS:  X-rays DDD lumbar   PATIENT SURVEYS:  FOTO 77% 1/17: 72%   COGNITION: Overall cognitive status: Within functional limits for tasks assessed                              MUSCLE LENGTH: Hamstrings: Right 70 deg; Left 70 deg Thomas test: Right 10 deg; Left 10 deg   POSTURE: No Significant postural limitations   LUMBAR ROM:    AROM eval 12/12 1/17  Flexion Fingertips 6 inches from toes 3 inches from toes Fingers to toes  Extension 15 20 20   Right lateral flexion 20 25 30   Left lateral flexion 20 25 30   Right rotation      Left rotation       (Blank rows = not tested)   TRUNK STRENGTH:  Decreased activation of transverse abdominus muscles; abdominals 4-/5; decreased activation of lumbar multifidi; trunk extensors 4-/5;  pain with bridge  1/17: LOWER EXTREMITY ROM:   WFLs   LOWER EXTREMITY MMT:  Pelvic drop with SLS;  needs mod assist of railings with 6 inch step down test right/left    MMT Right eval Left eval 12/12 1/17  Hip flexion 4 4 4+ 5  Hip extension 4 4 4+ Left 4+/right 5  Hip abduction 4- 4- 4 Left 4+/right 5   Hip adduction        Hip internal rotation        Hip external rotation        Knee flexion        Knee extension 4- 4- 4+ 4+  Ankle dorsiflexion        Ankle plantarflexion        Ankle inversion        Ankle eversion         (Blank rows = not tested)     FUNCTIONAL TESTS:  5 times sit to stand: 12 sec no UE use SLS 3-5 sec  12/12:  7 sec right/left  1/17:  SLS right unlimited. Left 5 sec 5x sit to stand 12.97 no hands  GAIT:   Comments: no impairment at slow or fast speed   TODAY'S TREATMENT:    1/17: Nu-Step 8 min L3 while discussing status/progress SLS Lumbar ROM FOTO Supine blue band clams double and single focusing on slow control (given blue band for home) Sidelying hip abduction left 5x Prone over 1 pillow hip extension right/left 10x Standing blue band hip abduction 5x left Standing blue band clam 10x left Therapeutic activities:  sit to stand, standing, walking, negotiating curbs, steps, getting on/off the floor    10/12/22: Nu-Step 8 min L3 while discussing status/progress 2nd step stretch with UE elevation and reach overhead 10x 6 inch step ups holding 5# 20x right/left (no hands needed for balance) Staggered stance (back leg is a kickstand for balance) red band stir the pot  10x each direction 2 5# dumbbells dead lifts "putting on your pants" cue 10x 5# snatch/press overhead 10x right/left Resisted cable walk 20# backwards only 5x Lat bar 30# standing 20x Hip machine 40# abduction, extension and  flexion 10x each right/left  Therapeutic activities:  sit to stand, standing, walking, negotiating curbs, steps, getting on/off the floor      PATIENT EDUCATION:  Education details: plan of care Person educated: Patient Education method: Explanation Education comprehension: verbalized understanding   HOME EXERCISE PROGRAM: Access Code: [email or web address] URL: [email or web address] Date: 10/26/2022 Prepared by: Ruben Im  Exercises -  Sidelying Hip Abduction  - 1 x daily - 7 x weekly - 1 sets - 10 reps - Sit to Stand  - 1 x daily - 7 x weekly - 1 sets - 10 reps - Forward Step Up  - 1 x daily - 7 x weekly - 1 sets - 10 reps - Modified Thomas Stretch  - 1 x daily - 7 x weekly - 3 reps - 30 hold - Anti-Rotation Press With Sidesteps and Anchored Resistance  - 1 x daily - 7 x weekly - 1 sets - 10 reps - SNATCH AND PRESS OVERHEAD  - 1 x daily - 7 x weekly - 1 sets - 10 reps - Prone Hip Extension with Pillow Under Abdomen  - 1 x daily - 7 x weekly - 1 sets - 10 reps - Hooklying Clamshell with Resistance  - 1 x daily - 7 x weekly - 1 sets - 10 reps - Standing Clam with Resistance Loop  - 1 x daily - 7 x weekly - 1 sets - 10 reps - Standing Hip Abduction with Resistance at Thighs  - 1 x daily - 7 x weekly - 1 sets - 10 reps   ASSESSMENT:   CLINICAL IMPRESSION:  The patient is progressing steadily with short term and long term goals but would benefit from additional skilled PT for a further progression of strengthening and functional mobility.  Will continue to update and promote independence in a HEP needed for a return to the highest functional level possible with ADLs.   Will focus on left LE strengthening particularly glute medius needed for return to dancing and walking at a faster gait speed.     OBJECTIVE IMPAIRMENTS: decreased activity tolerance, decreased ROM, decreased strength, increased fascial restrictions, and pain.    ACTIVITY LIMITATIONS: stairs and transfers   PARTICIPATION LIMITATIONS: occupation   PERSONAL FACTORS:  good health  are also affecting patient's functional outcome.    REHAB POTENTIAL: Good   CLINICAL DECISION MAKING: Stable/uncomplicated   EVALUATION COMPLEXITY: Low     GOALS: Goals reviewed with patient? Yes   SHORT TERM GOALS: Target date: 09/20/2022   The patient will demonstrate knowledge of basic self care strategies and exercises to promote healing   Baseline: Goal status:goal met  12/12   2.  The patient will have improved hip strength to at least 4/5 needed for getting up and down from the floor and descending stairs at home and in the community  Baseline:  Goal status: goal met 12/12   3.  The patient will have improved trunk flexor and extensor muscle strength to at least 4/5 needed for lifting light to medium weight objects   Baseline:  Goal status: goal met 12/12       LONG TERM GOALS: Target date: 12/11/2022   The patient will be independent in a safe self progression of a home exercise program to promote further recovery of function   Baseline:  Goal status: ongoing   2.  The patient will report minimal to no back pain with functional activities including work duties Baseline:  Goal status: goal met  3.  The patient will have improved trunk flexor and extensor muscle strength to at least 4+/5 needed for lifting medium weight objects such as  laundry and luggage  Baseline:  Goal status: goal met 1/17   4.  The patient will have improved left hip strength to at least 5-/5 needed for getting up off the floor and descending stairs at home and in the community and return to dancing Baseline:  Goal status: ongoing   5.  The patient will have improved FOTO score to   86%    indicating improved function with less pain  Baseline:  Goal status: ongoing   6. Improve gait speed to <82m/sec to be a community ambulator NEW     PLAN:   PT FREQUENCY: 1-2x/week   PT DURATION: 8 weeks   PLANNED INTERVENTIONS: Therapeutic exercises, Therapeutic activity, Neuromuscular re-education, Gait training, Patient/Family education, Self Care, Joint mobilization, Aquatic Therapy, Dry Needling, Electrical stimulation, Spinal manipulation, Spinal mobilization, Cryotherapy, Moist heat, Taping, Traction, Ultrasound, Ionotophoresis 4mg /ml Dexamethasone, Manual therapy, and Re-evaluation.   PLAN FOR NEXT SESSION: left glute medius strengthening; prone over 1 pillow hip extension;   check 10 meter walk gait speed;    LE flexibility, lumbo/pelvic/hip strengthening; step ups, floor transfer practice  Ruben Im, PT 10/26/22 4:34 PM Phone: [phone] Fax: [phone]

## 2022-11-09 ENCOUNTER — Ambulatory Visit: Payer: No Typology Code available for payment source | Admitting: Physical Therapy

## 2022-11-09 DIAGNOSIS — M5459 Other low back pain: Secondary | ICD-10-CM

## 2022-11-09 DIAGNOSIS — M6281 Muscle weakness (generalized): Secondary | ICD-10-CM

## 2022-11-09 NOTE — Therapy (Signed)
OUTPATIENT PHYSICAL THERAPY TREATMENT NOTE  Patient Name: Dariane Natzke MRN: [id number] DOB:[birth year], 72 y.o., female Today's Date: 11/09/2022  PCP: [place], Betty MD  REFERRING PROVIDER: [place], Betty MD   END OF SESSION:   PT End of Session - 11/09/22 1453     Visit Number 8    Date for PT Re-Evaluation 12/21/22    Authorization Type [facility]    PT Start Time 1446    PT Stop Time 1527    PT Time Calculation (min) 41 min    Activity Tolerance Patient tolerated treatment well                  Past Medical History:  Diagnosis Date   Allergy    Arthritis    GERD (gastroesophageal reflux disease)    Hypertension    Past Surgical History:  Procedure Laterality Date   BREAST SURGERY     biopsy   ENDOVENOUS ABLATION SAPHENOUS VEIN W/ LASER Right 12/12/2018   endovenous laser ablation right greater saphenous vein by Fabienne Bruns MD   RIGHT/LEFT HEART CATH AND CORONARY ANGIOGRAPHY N/A 03/10/2022   Procedure: RIGHT/LEFT HEART CATH AND CORONARY ANGIOGRAPHY;  Surgeon: Kathleene Hazel, MD;  Location: [facility];  Service: Cardiovascular;  Laterality: N/A;   stab phlebectomy  Right 09/11/2019   stab phlebectomy > 20 incisions right leg by Fabienne Bruns MD    TEE WITHOUT CARDIOVERSION N/A 03/10/2022   Procedure: TRANSESOPHAGEAL ECHOCARDIOGRAM (TEE);  Surgeon: Maisie Fus, MD;  Location: [facility] ENDOSCOPY;  Service: Cardiovascular;  Laterality: N/A;   Patient Active Problem List   Diagnosis Date Noted   Hypokalemia 10/14/2022   Nonrheumatic mitral valve regurgitation    Prediabetes 11/15/2021   GERD (gastroesophageal reflux disease) 11/15/2021   Hyperlipidemia 06/05/2019   Finger deformity, left 02/10/2018   Hypertension, essential, benign 03/14/2017   Class 1 obesity with body mass index (BMI) of 31.0 to 31.9 in adult 03/14/2017   Plantar callus 03/14/2017   Osteoarthritis, hand 03/14/2017    REFERRING DIAG: M54.50 acute right sided low back pain    THERAPY DIAG:  Other low back pain  Muscle weakness (generalized)  Rationale for Evaluation and Treatment Rehabilitation  PERTINENT HISTORY: Pronounced [name] -boor-rah At the end of May had surgery for heart blockage but when opened her up there was no blockage    PRECAUTIONS: None  SUBJECTIVE:                                                                                                                                                                                      SUBJECTIVE STATEMENT:   I've been doing 15-20 min on treadmill  every morning before work.  Stretching too but no weights.  I'm looking forward to a [place] cruise in Sept.  I have to be able to walk those hills.    Has 5# and 8# weights at home  Are you having pain? No   OBJECTIVE: (objective measures completed at initial evaluation unless otherwise dated)   DIAGNOSTIC FINDINGS:  X-rays DDD lumbar   PATIENT SURVEYS:  FOTO 77% 1/17: 72%   COGNITION: Overall cognitive status: Within functional limits for tasks assessed                              MUSCLE LENGTH: Hamstrings: Right 70 deg; Left 70 deg Thomas test: Right 10 deg; Left 10 deg   POSTURE: No Significant postural limitations   LUMBAR ROM:    AROM eval 12/12 1/17  Flexion Fingertips 6 inches from toes 3 inches from toes Fingers to toes  Extension 15 20 20   Right lateral flexion 20 25 30   Left lateral flexion 20 25 30   Right rotation      Left rotation       (Blank rows = not tested)   TRUNK STRENGTH:  Decreased activation of transverse abdominus muscles; abdominals 4-/5; decreased activation of lumbar multifidi; trunk extensors 4-/5;  pain with bridge  1/17: LOWER EXTREMITY ROM:   WFLs   LOWER EXTREMITY MMT:  Pelvic drop with SLS;  needs mod assist of railings with 6 inch step down test right/left    MMT Right eval Left eval 12/12 1/17  Hip flexion 4 4 4+ 5  Hip extension 4 4 4+ Left 4+/right 5  Hip abduction 4- 4- 4  Left 4+/right 5  Hip adduction        Hip internal rotation        Hip external rotation        Knee flexion        Knee extension 4- 4- 4+ 4+  Ankle dorsiflexion        Ankle plantarflexion        Ankle inversion        Ankle eversion         (Blank rows = not tested)     FUNCTIONAL TESTS:  5 times sit to stand: 12 sec no UE use SLS 3-5 sec  12/12:  7 sec right/left  1/17:  SLS right unlimited. Left 5 sec 5x sit to stand 12.97 no hands  GAIT:   Comments: no impairment at slow or fast speed   TODAY'S TREATMENT:    1/31: Nu-Step L5 new model 10 min 2  8# dead lifts 10x 8# squats with weight under chin 10x 8# snatch and press overhead 10x right/left  Resisted walk 15# cable backwards 10x; forward 4x 6 inch step ups holding 5# dumbbell in 1 hand:  10x right/left Standing lat bar 35# 10x 2 30# barbell lift from 2 chairs (knee level) 10x Hip machine 40#: hip abduction and hip extension 10x each right/left  Therapeutic activities:  sit to stand, standing, walking, negotiating curbs, steps, getting on/off the floor        1/17: Nu-Step 8 min L3 while discussing status/progress SLS Lumbar ROM FOTO Supine blue band clams double and single focusing on slow control (given blue band for home) Sidelying hip abduction left 5x Prone over 1 pillow hip extension right/left 10x Standing blue band hip abduction 5x left Standing blue band clam 10x left Therapeutic activities:  sit to stand, standing, walking, negotiating curbs, steps, getting on/off the floor    10/12/22: Nu-Step 8 min L3 while discussing status/progress 2nd step stretch with UE elevation and reach overhead 10x 6 inch step ups holding 5# 20x right/left (no hands needed for balance) Staggered stance (back leg is a kickstand for balance) red band stir the pot  10x each direction 2 5# dumbbells dead lifts "putting on your pants" cue 10x 5# snatch/press overhead 10x right/left Resisted cable walk 20# backwards  only 5x Lat bar 30# standing 20x Hip machine 40# abduction, extension and flexion 10x each right/left  Therapeutic activities:  sit to stand, standing, walking, negotiating curbs, steps, getting on/off the floor      PATIENT EDUCATION:  Education details: plan of care Person educated: Patient Education method: Explanation Education comprehension: verbalized understanding   HOME EXERCISE PROGRAM: Access Code: [email or web address] URL: [email or web address] Date: 10/26/2022 Prepared by: Ruben Im  Exercises - Sidelying Hip Abduction  - 1 x daily - 7 x weekly - 1 sets - 10 reps - Sit to Stand  - 1 x daily - 7 x weekly - 1 sets - 10 reps - Forward Step Up  - 1 x daily - 7 x weekly - 1 sets - 10 reps - Modified Thomas Stretch  - 1 x daily - 7 x weekly - 3 reps - 30 hold - Anti-Rotation Press With Sidesteps and Anchored Resistance  - 1 x daily - 7 x weekly - 1 sets - 10 reps - SNATCH AND PRESS OVERHEAD  - 1 x daily - 7 x weekly - 1 sets - 10 reps - Prone Hip Extension with Pillow Under Abdomen  - 1 x daily - 7 x weekly - 1 sets - 10 reps - Hooklying Clamshell with Resistance  - 1 x daily - 7 x weekly - 1 sets - 10 reps - Standing Clam with Resistance Loop  - 1 x daily - 7 x weekly - 1 sets - 10 reps - Standing Hip Abduction with Resistance at Thighs  - 1 x daily - 7 x weekly - 1 sets - 10 reps   ASSESSMENT:   CLINICAL IMPRESSION:  Patient able to progress weights/resistance in nearly all exercises today without difficulty or exacerbation of pain.  Verbal cues to widen stance with squats and dead lifts secondary to some favoring of left knee (however not painful today.)  She reports feeling good post session and is highly motivated to continue strengthening in preparation for a big trip in Sept.    OBJECTIVE IMPAIRMENTS: decreased activity tolerance, decreased ROM, decreased strength, increased fascial restrictions, and pain.    ACTIVITY LIMITATIONS: stairs and transfers    PARTICIPATION LIMITATIONS: occupation   PERSONAL FACTORS:  good health  are also affecting patient's functional outcome.    REHAB POTENTIAL: Good   CLINICAL DECISION MAKING: Stable/uncomplicated   EVALUATION COMPLEXITY: Low     GOALS: Goals reviewed with patient? Yes   SHORT TERM GOALS: Target date: 09/20/2022   The patient will demonstrate knowledge of basic self care strategies and exercises to promote healing   Baseline: Goal status:goal met 12/12   2.  The patient will have improved hip strength to at least 4/5 needed for getting up and down from the floor and descending stairs at home and in the community  Baseline:  Goal status: goal met 12/12   3.  The patient will have improved trunk flexor and extensor muscle strength to at least 4/5 needed for  lifting light to medium weight objects   Baseline:  Goal status: goal met 12/12       LONG TERM GOALS: Target date: 12/11/2022   The patient will be independent in a safe self progression of a home exercise program to promote further recovery of function   Baseline:  Goal status: ongoing   2.  The patient will report minimal to no back pain with functional activities including work duties Baseline:  Goal status: goal met  3.  The patient will have improved trunk flexor and extensor muscle strength to at least 4+/5 needed for lifting medium weight objects such as  laundry and luggage  Baseline:  Goal status: goal met 1/17   4.  The patient will have improved left hip strength to at least 5-/5 needed for getting up off the floor and descending stairs at home and in the community and return to dancing Baseline:  Goal status: ongoing   5.  The patient will have improved FOTO score to   86%    indicating improved function with less pain  Baseline:  Goal status: ongoing   6. Improve gait speed to <70m/sec to be a community ambulator NEW     PLAN:   PT FREQUENCY: 1-2x/week   PT DURATION: 8 weeks   PLANNED  INTERVENTIONS: Therapeutic exercises, Therapeutic activity, Neuromuscular re-education, Gait training, Patient/Family education, Self Care, Joint mobilization, Aquatic Therapy, Dry Needling, Electrical stimulation, Spinal manipulation, Spinal mobilization, Cryotherapy, Moist heat, Taping, Traction, Ultrasound, Ionotophoresis 4mg /ml Dexamethasone, Manual therapy, and Re-evaluation.   PLAN FOR NEXT SESSION: left glute medius strengthening; prone over 1 pillow hip extension;  10 meter walk gait speed;    LE flexibility, lumbo/pelvic/hip strengthening; step ups, floor transfer practice  Ruben Im, PT 11/09/22 3:36 PM Phone: [phone] Fax: [phone]

## 2022-11-17 ENCOUNTER — Ambulatory Visit: Payer: No Typology Code available for payment source | Attending: Family Medicine | Admitting: Physical Therapy

## 2022-11-17 DIAGNOSIS — M6281 Muscle weakness (generalized): Secondary | ICD-10-CM | POA: Insufficient documentation

## 2022-11-17 DIAGNOSIS — M5459 Other low back pain: Secondary | ICD-10-CM | POA: Diagnosis not present

## 2022-11-17 NOTE — Therapy (Signed)
OUTPATIENT PHYSICAL THERAPY TREATMENT NOTE  Patient Name: Robin Martinez MRN: [id number] DOB:1951/03/02, 72 y.o., female Today's Date: 11/17/2022  PCP: [place], Betty MD  REFERRING PROVIDER: [place], Betty MD   END OF SESSION:   PT End of Session - 11/17/22 1522     Visit Number 9    Date for PT Re-Evaluation 12/21/22    Authorization Type [facility]    PT Start Time 1523    PT Stop Time 1605    PT Time Calculation (min) 42 min    Activity Tolerance Patient tolerated treatment well                  Past Medical History:  Diagnosis Date   Allergy    Arthritis    GERD (gastroesophageal reflux disease)    Hypertension    Past Surgical History:  Procedure Laterality Date   BREAST SURGERY     biopsy   ENDOVENOUS ABLATION SAPHENOUS VEIN W/ LASER Right 12/12/2018   endovenous laser ablation right greater saphenous vein by Ruta Hinds MD   RIGHT/LEFT HEART CATH AND CORONARY ANGIOGRAPHY N/A 03/10/2022   Procedure: RIGHT/LEFT HEART CATH AND CORONARY ANGIOGRAPHY;  Surgeon: Burnell Blanks, MD;  Location: [place] [facility];  Service: Cardiovascular;  Laterality: N/A;   stab phlebectomy  Right 09/11/2019   stab phlebectomy > 20 incisions right leg by Ruta Hinds MD    TEE WITHOUT CARDIOVERSION N/A 03/10/2022   Procedure: TRANSESOPHAGEAL ECHOCARDIOGRAM (TEE);  Surgeon: Janina Mayo, MD;  Location: [facility];  Service: Cardiovascular;  Laterality: N/A;   Patient Active Problem List   Diagnosis Date Noted   Hypokalemia 10/14/2022   Nonrheumatic mitral valve regurgitation    Prediabetes 11/15/2021   GERD (gastroesophageal reflux disease) 11/15/2021   Hyperlipidemia 06/05/2019   Finger deformity, left 02/10/2018   Hypertension, essential, benign 03/14/2017   Class 1 obesity with body mass index (BMI) of 31.0 to 31.9 in adult 03/14/2017   Plantar callus 03/14/2017   Osteoarthritis, hand 03/14/2017    REFERRING DIAG: M54.50 acute right sided low back pain    THERAPY DIAG:  Other low back pain  Muscle weakness (generalized)  Rationale for Evaluation and Treatment Rehabilitation  PERTINENT HISTORY: Pronounced [name] -boor-rah At the end of May had surgery for heart blockage but when opened her up there was no blockage    PRECAUTIONS: None  SUBJECTIVE:                                                                                                                                                                                      SUBJECTIVE STATEMENT:   More confidence with descending the stairs reciprocally  now.  Some right back and thigh pain yesterday but not today.  Maybe with getting in/out of high bed.  Last time was good.  30 minutes on treadmill, 10 sec running.  I did CPR on Monday I think it was that.  I was able to get on/off the floor no problem.    Has 5# and 8# weights at home  Are you having pain? No   OBJECTIVE: (objective measures completed at initial evaluation unless otherwise dated)   DIAGNOSTIC FINDINGS:  X-rays DDD lumbar   PATIENT SURVEYS:  FOTO 77% 1/17: 72%   COGNITION: Overall cognitive status: Within functional limits for tasks assessed                              MUSCLE LENGTH: Hamstrings: Right 70 deg; Left 70 deg Thomas test: Right 10 deg; Left 10 deg   POSTURE: No Significant postural limitations   LUMBAR ROM:    AROM eval 12/12 1/17  Flexion Fingertips 6 inches from toes 3 inches from toes Fingers to toes  Extension 15 20 20   Right lateral flexion 20 25 30   Left lateral flexion 20 25 30   Right rotation      Left rotation       (Blank rows = not tested)   TRUNK STRENGTH:  Decreased activation of transverse abdominus muscles; abdominals 4-/5; decreased activation of lumbar multifidi; trunk extensors 4-/5;  pain with bridge  1/17: LOWER EXTREMITY ROM:   WFLs   LOWER EXTREMITY MMT:  Pelvic drop with SLS;  needs mod assist of railings with 6 inch step down test right/left    MMT  Right eval Left eval 12/12 1/17  Hip flexion 4 4 4+ 5  Hip extension 4 4 4+ Left 4+/right 5  Hip abduction 4- 4- 4 Left 4+/right 5  Hip adduction        Hip internal rotation        Hip external rotation        Knee flexion        Knee extension 4- 4- 4+ 4+  Ankle dorsiflexion        Ankle plantarflexion        Ankle inversion        Ankle eversion         (Blank rows = not tested)     FUNCTIONAL TESTS:  5 times sit to stand: 12 sec no UE use SLS 3-5 sec  12/12:  7 sec right/left  1/17:  SLS right unlimited. Left 5 sec 5x sit to stand 12.97 no hands  GAIT:   Comments: no impairment at slow or fast speed   TODAY'S TREATMENT:    2/8: Nu-Step L3 old model 4 min, 4 min at L1 secondary to right thigh pain 2x 30 sec step ups holding 8# 1 side 8# staggered stance/heel up RDL 10x right/left 8# squats with weight under chin 10x 15# kettlebell squats in front of mat table 10x  15# kettlebell swings 10x Standing lat bar 35# 15x 30# barbell lift from 2 chairs (knee level) 10x Hip machine 40#: hip abduction, hip flexion and hip extension 10x each right/left  RPE end of session 4/10 Therapeutic activities:  sit to stand, standing, walking, negotiating curbs, steps, getting on/off the floor     1/31: Nu-Step L5 new model 10 min 2  8# dead lifts 10x 8# squats with weight under chin 10x 8# snatch and press  overhead 10x right/left  Resisted walk 15# cable backwards 10x; forward 4x 6 inch step ups holding 5# dumbbell in 1 hand:  10x right/left Standing lat bar 35# 10x 2 30# barbell lift from 2 chairs (knee level) 10x Hip machine 40#: hip abduction and hip extension 10x each right/left  Therapeutic activities:  sit to stand, standing, walking, negotiating curbs, steps, getting on/off the floor        1/17: Nu-Step 8 min L3 while discussing status/progress SLS Lumbar ROM FOTO Supine blue band clams double and single focusing on slow control (given blue band for  home) Sidelying hip abduction left 5x Prone over 1 pillow hip extension right/left 10x Standing blue band hip abduction 5x left Standing blue band clam 10x left Therapeutic activities:  sit to stand, standing, walking, negotiating curbs, steps, getting on/off the floor       PATIENT EDUCATION:  Education details: plan of care Person educated: Patient Education method: Explanation Education comprehension: verbalized understanding   HOME EXERCISE PROGRAM: Access Code: [email or web address] URL: [email or web address] Date: 10/26/2022 Prepared by: Lavinia Sharps  Exercises - Sidelying Hip Abduction  - 1 x daily - 7 x weekly - 1 sets - 10 reps - Sit to Stand  - 1 x daily - 7 x weekly - 1 sets - 10 reps - Forward Step Up  - 1 x daily - 7 x weekly - 1 sets - 10 reps - Modified Thomas Stretch  - 1 x daily - 7 x weekly - 3 reps - 30 hold - Anti-Rotation Press With Sidesteps and Anchored Resistance  - 1 x daily - 7 x weekly - 1 sets - 10 reps - SNATCH AND PRESS OVERHEAD  - 1 x daily - 7 x weekly - 1 sets - 10 reps - Prone Hip Extension with Pillow Under Abdomen  - 1 x daily - 7 x weekly - 1 sets - 10 reps - Hooklying Clamshell with Resistance  - 1 x daily - 7 x weekly - 1 sets - 10 reps - Standing Clam with Resistance Loop  - 1 x daily - 7 x weekly - 1 sets - 10 reps - Standing Hip Abduction with Resistance at Thighs  - 1 x daily - 7 x weekly - 1 sets - 10 reps   ASSESSMENT:   CLINICAL IMPRESSION:  Able to progress intensity of exercise despite increased soreness after CPR training this week.  She was able to get up and down off the floor for this with ease.  Verbal cues for wider stance with squats and dead lifts and cue for patellofemoral alignment.  She reports her back feels good at the end of session and not overly fatigued.    OBJECTIVE IMPAIRMENTS: decreased activity tolerance, decreased ROM, decreased strength, increased fascial restrictions, and pain.    ACTIVITY  LIMITATIONS: stairs and transfers   PARTICIPATION LIMITATIONS: occupation   PERSONAL FACTORS:  good health  are also affecting patient's functional outcome.    REHAB POTENTIAL: Good   CLINICAL DECISION MAKING: Stable/uncomplicated   EVALUATION COMPLEXITY: Low     GOALS: Goals reviewed with patient? Yes   SHORT TERM GOALS: Target date: 09/20/2022   The patient will demonstrate knowledge of basic self care strategies and exercises to promote healing   Baseline: Goal status:goal met 12/12   2.  The patient will have improved hip strength to at least 4/5 needed for getting up and down from the floor and descending stairs at home and in the community  Baseline:  Goal status: goal met 12/12   3.  The patient will have improved trunk flexor and extensor muscle strength to at least 4/5 needed for lifting light to medium weight objects   Baseline:  Goal status: goal met 12/12       LONG TERM GOALS: Target date: 12/11/2022   The patient will be independent in a safe self progression of a home exercise program to promote further recovery of function   Baseline:  Goal status: ongoing   2.  The patient will report minimal to no back pain with functional activities including work duties Baseline:  Goal status: goal met  3.  The patient will have improved trunk flexor and extensor muscle strength to at least 4+/5 needed for lifting medium weight objects such as  laundry and luggage  Baseline:  Goal status: goal met 1/17   4.  The patient will have improved left hip strength to at least 5-/5 needed for getting up off the floor and descending stairs at home and in the community and return to dancing Baseline:  Goal status: ongoing   5.  The patient will have improved FOTO score to   86%    indicating improved function with less pain  Baseline:  Goal status: ongoing   6. Improve gait speed to <69m/sec to be a community ambulator NEW     PLAN:   PT FREQUENCY: 1-2x/week   PT  DURATION: 8 weeks   PLANNED INTERVENTIONS: Therapeutic exercises, Therapeutic activity, Neuromuscular re-education, Gait training, Patient/Family education, Self Care, Joint mobilization, Aquatic Therapy, Dry Needling, Electrical stimulation, Spinal manipulation, Spinal mobilization, Cryotherapy, Moist heat, Taping, Traction, Ultrasound, Ionotophoresis 4mg /ml Dexamethasone, Manual therapy, and Re-evaluation.   PLAN FOR NEXT SESSION: 10th visit progress note;  left glute medius strengthening; prone over 1 pillow hip extension;  10 meter walk gait speed;    LE flexibility, lumbo/pelvic/hip strengthening; step ups, floor transfer practice  Ruben Im, PT 11/17/22 5:30 PM Phone: [phone] Fax: [phone]

## 2022-11-22 ENCOUNTER — Ambulatory Visit: Payer: No Typology Code available for payment source | Admitting: Physical Therapy

## 2022-11-22 DIAGNOSIS — M6281 Muscle weakness (generalized): Secondary | ICD-10-CM

## 2022-11-22 DIAGNOSIS — M5459 Other low back pain: Secondary | ICD-10-CM | POA: Diagnosis not present

## 2022-11-22 NOTE — Therapy (Signed)
OUTPATIENT PHYSICAL THERAPY TREATMENT NOTE  Patient Name: Robin Martinez MRN: [id number] DOB:05-Jul-1951, 72 y.o., female Today's Date: 11/22/2022  PCP: Martinique, Betty MD  REFERRING PROVIDER: Martinique, Betty MD  Progress Note Reporting Period 08/23/22 to 11/22/22  See note below for Objective Data and Assessment of Progress/Goals.     END OF SESSION:   PT End of Session - 11/22/22 1447     Visit Number 10    Date for PT Re-Evaluation 12/21/22    Authorization Type [facility]    PT Start Time 1446    PT Stop Time 1528    PT Time Calculation (min) 42 min    Activity Tolerance Patient tolerated treatment well                  Past Medical History:  Diagnosis Date   Allergy    Arthritis    GERD (gastroesophageal reflux disease)    Hypertension    Past Surgical History:  Procedure Laterality Date   BREAST SURGERY     biopsy   ENDOVENOUS ABLATION SAPHENOUS VEIN W/ LASER Right 12/12/2018   endovenous laser ablation right greater saphenous vein by Ruta Hinds MD   RIGHT/LEFT HEART CATH AND CORONARY ANGIOGRAPHY N/A 03/10/2022   Procedure: RIGHT/LEFT HEART CATH AND CORONARY ANGIOGRAPHY;  Surgeon: Burnell Blanks, MD;  Location: [facility];  Service: Cardiovascular;  Laterality: N/A;   stab phlebectomy  Right 09/11/2019   stab phlebectomy > 20 incisions right leg by Ruta Hinds MD    TEE WITHOUT CARDIOVERSION N/A 03/10/2022   Procedure: TRANSESOPHAGEAL ECHOCARDIOGRAM (TEE);  Surgeon: Janina Mayo, MD;  Location: [facility];  Service: Cardiovascular;  Laterality: N/A;   Patient Active Problem List   Diagnosis Date Noted   Hypokalemia 10/14/2022   Nonrheumatic mitral valve regurgitation    Prediabetes 11/15/2021   GERD (gastroesophageal reflux disease) 11/15/2021   Hyperlipidemia 06/05/2019   Finger deformity, left 02/10/2018   Hypertension, essential, benign 03/14/2017   Class 1 obesity with body mass index (BMI) of 31.0 to 31.9 in adult 03/14/2017    Plantar callus 03/14/2017   Osteoarthritis, hand 03/14/2017    REFERRING DIAG: M54.50 acute right sided low back pain   THERAPY DIAG:  Other low back pain  Muscle weakness (generalized)  Rationale for Evaluation and Treatment Rehabilitation  PERTINENT HISTORY: Pronounced [name] -boor-rah At the end of May had surgery for heart blockage but when opened her up there was no blockage    PRECAUTIONS: None  SUBJECTIVE:  SUBJECTIVE STATEMENT:   1 had a weird episode of lower leg pain on Sunday but it went away.    Has 5# and 8# weights at home  Are you having pain? No   OBJECTIVE: (objective measures completed at initial evaluation unless otherwise dated)   DIAGNOSTIC FINDINGS:  X-rays DDD lumbar   PATIENT SURVEYS:  FOTO 77% 1/17: 72% 2/13:  69%   COGNITION: Overall cognitive status: Within functional limits for tasks assessed                              MUSCLE LENGTH: Hamstrings: Right 70 deg; Left 70 deg Thomas test: Right 10 deg; Left 10 deg   POSTURE: No Significant postural limitations   LUMBAR ROM:    AROM eval 12/12 1/17 2/13  Flexion Fingertips 6 inches from toes 3 inches from toes Fingers to toes   Extension 15 20 20   $ Right lateral flexion 20 25 30   $ Left lateral flexion 20 25 30   $ Right rotation       Left rotation        (Blank rows = not tested)   TRUNK STRENGTH:  Decreased activation of transverse abdominus muscles; abdominals 4-/5; decreased activation of lumbar multifidi; trunk extensors 4-/5;  pain with bridge 2/13:  trunk strength grossly 4/5    1/17: LOWER EXTREMITY ROM:   WFLs   LOWER EXTREMITY MMT:  Pelvic drop with SLS;  needs mod assist of railings with 6 inch step down test right/left    MMT Right eval Left eval 12/12 1/17 2/13  Hip flexion 4 4 4+ 5 5   Hip extension 4 4 4+ Left 4+/right 5 5- left/5 right  Hip abduction 4- 4- 4 Left 4+/right 5 5- left/5/right  Hip adduction         Hip internal rotation         Hip external rotation         Knee flexion         Knee extension 4- 4- 4+ 4+ 5-/5 bil  Ankle dorsiflexion         Ankle plantarflexion         Ankle inversion         Ankle eversion          (Blank rows = not tested)     FUNCTIONAL TESTS:  5 times sit to stand: 12 sec no UE use SLS 3-5 sec  12/12:  7 sec right/left  1/17:  SLS right unlimited. Left 5 sec 5x sit to stand 12.97 no hands   2/13: Sit to stand 12.9 SLS :  8 sec on left, 5 sec on right   GAIT:   Comments: no impairment at slow or fast speed   TODAY'S TREATMENT:    2/13: Nu-step with pillow behind the back 8 min L2 while discuss status 30# barbell from push up bars 12 inches tall:2 sets of 5 Every minute on the minute:  8 minutes Minute 1: step ups holding 5# 10 on each side Minute 2:  side stepping with blue band Minute 3:  5# snatch and press overhead Min 4: rest Lat bar 35# 20x Holding bar: back step/lunge back over blue pod 5x on each side Hip machine 40#: hip abduction, hip flexion and hip extension 10x each right/left RPE 8/10 at end of session Therapeutic activities:  sit to stand, standing, walking, negotiating curbs, steps, getting on/off the  floor      2/8: Nu-Step L3 old model 4 min, 4 min at L1 secondary to right thigh pain 2x 30 sec step ups holding 8# 1 side 8# staggered stance/heel up RDL 10x right/left 8# squats with weight under chin 10x 15# kettlebell squats in front of mat table 10x  15# kettlebell swings 10x Standing lat bar 35# 15x 30# barbell lift from 2 chairs (knee level) 10x Hip machine 40#: hip abduction, hip flexion and hip extension 10x each right/left  RPE end of session 4/10 Therapeutic activities:  sit to stand, standing, walking, negotiating curbs, steps, getting on/off the floor     1/31: Nu-Step  L5 new model 10 min 2  8# dead lifts 10x 8# squats with weight under chin 10x 8# snatch and press overhead 10x right/left  Resisted walk 15# cable backwards 10x; forward 4x 6 inch step ups holding 5# dumbbell in 1 hand:  10x right/left Standing lat bar 35# 10x 2 30# barbell lift from 2 chairs (knee level) 10x Hip machine 40#: hip abduction and hip extension 10x each right/left  Therapeutic activities:  sit to stand, standing, walking, negotiating curbs, steps, getting on/off the floor       PATIENT EDUCATION:  Education details: plan of care Person educated: Patient Education method: Explanation Education comprehension: verbalized understanding   HOME EXERCISE PROGRAM: Access Code: [email or web address] URL: [email or web address] Date: 10/26/2022 Prepared by: Ruben Im  Exercises - Sidelying Hip Abduction  - 1 x daily - 7 x weekly - 1 sets - 10 reps - Sit to Stand  - 1 x daily - 7 x weekly - 1 sets - 10 reps - Forward Step Up  - 1 x daily - 7 x weekly - 1 sets - 10 reps - Modified Thomas Stretch  - 1 x daily - 7 x weekly - 3 reps - 30 hold - Anti-Rotation Press With Sidesteps and Anchored Resistance  - 1 x daily - 7 x weekly - 1 sets - 10 reps - SNATCH AND PRESS OVERHEAD  - 1 x daily - 7 x weekly - 1 sets - 10 reps - Prone Hip Extension with Pillow Under Abdomen  - 1 x daily - 7 x weekly - 1 sets - 10 reps - Hooklying Clamshell with Resistance  - 1 x daily - 7 x weekly - 1 sets - 10 reps - Standing Clam with Resistance Loop  - 1 x daily - 7 x weekly - 1 sets - 10 reps - Standing Hip Abduction with Resistance at Thighs  - 1 x daily - 7 x weekly - 1 sets - 10 reps   ASSESSMENT:   CLINICAL IMPRESSION:  The patient is progressing with lumbo/pelvic/hip core strength and decreasing pain in her back.  She would like to return to dancing and improve her strength to negotiate stairs with greater ease.  She also has a big trip planned and would like to be stronger to prepare for  the travel.  The patient would benefit from a continuation of skilled PT for a further progression of strengthening and functional mobility.  Will continue to update and promote independence in a HEP needed for a return to the highest functional level possible with ADLs.        OBJECTIVE IMPAIRMENTS: decreased activity tolerance, decreased ROM, decreased strength, increased fascial restrictions, and pain.    ACTIVITY LIMITATIONS: stairs and transfers   PARTICIPATION LIMITATIONS: occupation   PERSONAL FACTORS:  good health  are also affecting patient's  functional outcome.    REHAB POTENTIAL: Good   CLINICAL DECISION MAKING: Stable/uncomplicated   EVALUATION COMPLEXITY: Low     GOALS: Goals reviewed with patient? Yes   SHORT TERM GOALS: Target date: 09/20/2022   The patient will demonstrate knowledge of basic self care strategies and exercises to promote healing   Baseline: Goal status:goal met 12/12   2.  The patient will have improved hip strength to at least 4/5 needed for getting up and down from the floor and descending stairs at home and in the community  Baseline:  Goal status: goal met 12/12   3.  The patient will have improved trunk flexor and extensor muscle strength to at least 4/5 needed for lifting light to medium weight objects   Baseline:  Goal status: goal met 12/12       LONG TERM GOALS: Target date: 12/11/2022   The patient will be independent in a safe self progression of a home exercise program to promote further recovery of function   Baseline:  Goal status: ongoing   2.  The patient will report minimal to no back pain with functional activities including work duties Baseline:  Goal status: goal met  3.  The patient will have improved trunk flexor and extensor muscle strength to at least 4+/5 needed for lifting medium weight objects such as  laundry and luggage  Baseline:  Goal status: goal met 1/17   4.  The patient will have improved left hip  strength to at least 5-/5 needed for getting up off the floor and descending stairs at home and in the community and return to dancing Baseline:  Goal status: ongoing   5.  The patient will have improved FOTO score to   86%    indicating improved function with less pain  Baseline:  Goal status: ongoing   6. Improve gait speed to <67msec to be a community ambulator NEW     PLAN:   PT FREQUENCY: 1-2x/week   PT DURATION: 8 weeks   PLANNED INTERVENTIONS: Therapeutic exercises, Therapeutic activity, Neuromuscular re-education, Gait training, Patient/Family education, Self Care, Joint mobilization, Aquatic Therapy, Dry Needling, Electrical stimulation, Spinal manipulation, Spinal mobilization, Cryotherapy, Moist heat, Taping, Traction, Ultrasound, Ionotophoresis 489mml Dexamethasone, Manual therapy, and Re-evaluation.   PLAN FOR NEXT SESSION: dance: cha-cha or electric slide;   left glute medius strengthening; prone over 1 pillow hip extension;  10 meter walk gait speed;    LE flexibility, lumbo/pelvic/hip strengthening; step ups, floor transfer practice  StRuben ImPT 11/22/22 5:20 PM Phone: [phone]: [phone]

## 2022-11-29 ENCOUNTER — Ambulatory Visit: Payer: No Typology Code available for payment source | Admitting: Physical Therapy

## 2022-11-29 DIAGNOSIS — M5459 Other low back pain: Secondary | ICD-10-CM | POA: Diagnosis not present

## 2022-11-29 DIAGNOSIS — M6281 Muscle weakness (generalized): Secondary | ICD-10-CM

## 2022-11-29 NOTE — Therapy (Signed)
OUTPATIENT PHYSICAL THERAPY TREATMENT NOTE  Patient Name: Robin Martinez MRN: [id number] DOB:1951-08-02, 72 y.o., female Today's Date: 11/29/2022  PCP: Martinique, Betty MD  REFERRING PROVIDER: Martinique, Betty MD    END OF SESSION:   PT End of Session - 11/29/22 1537     Visit Number 11    Date for PT Re-Evaluation 12/21/22    Authorization Type [facility]    PT Start Time 1538    PT Stop Time 1616    PT Time Calculation (min) 38 min    Activity Tolerance Patient tolerated treatment well                  Past Medical History:  Diagnosis Date   Allergy    Arthritis    GERD (gastroesophageal reflux disease)    Hypertension    Past Surgical History:  Procedure Laterality Date   BREAST SURGERY     biopsy   ENDOVENOUS ABLATION SAPHENOUS VEIN W/ LASER Right 12/12/2018   endovenous laser ablation right greater saphenous vein by Ruta Hinds MD   RIGHT/LEFT HEART CATH AND CORONARY ANGIOGRAPHY N/A 03/10/2022   Procedure: RIGHT/LEFT HEART CATH AND CORONARY ANGIOGRAPHY;  Surgeon: Burnell Blanks, MD;  Location: [facility];  Service: Cardiovascular;  Laterality: N/A;   stab phlebectomy  Right 09/11/2019   stab phlebectomy > 20 incisions right leg by Ruta Hinds MD    TEE WITHOUT CARDIOVERSION N/A 03/10/2022   Procedure: TRANSESOPHAGEAL ECHOCARDIOGRAM (TEE);  Surgeon: Janina Mayo, MD;  Location: [facility];  Service: Cardiovascular;  Laterality: N/A;   Patient Active Problem List   Diagnosis Date Noted   Hypokalemia 10/14/2022   Nonrheumatic mitral valve regurgitation    Prediabetes 11/15/2021   GERD (gastroesophageal reflux disease) 11/15/2021   Hyperlipidemia 06/05/2019   Finger deformity, left 02/10/2018   Hypertension, essential, benign 03/14/2017   Class 1 obesity with body mass index (BMI) of 31.0 to 31.9 in adult 03/14/2017   Plantar callus 03/14/2017   Osteoarthritis, hand 03/14/2017    REFERRING DIAG: M54.50 acute right sided low back pain    THERAPY DIAG:  Other low back pain  Muscle weakness (generalized)  Rationale for Evaluation and Treatment Rehabilitation  PERTINENT HISTORY: Pronounced [name] -boor-rah At the end of May had surgery for heart blockage but when opened her up there was no blockage    PRECAUTIONS: None  SUBJECTIVE:                                                                                                                                                                                      SUBJECTIVE STATEMENT:   I have a theory that climbing  up in bed causes  this.  Lying on that side bothers me.  Sitting too much can bother me.  At work it doesn't bother me b/c I change position  a lot.  Has 5# and 8# weights at home  Are you having pain? No   OBJECTIVE: (objective measures completed at initial evaluation unless otherwise dated)   DIAGNOSTIC FINDINGS:  X-rays DDD lumbar   PATIENT SURVEYS:  FOTO 77% 1/17: 72% 2/13:  69%   COGNITION: Overall cognitive status: Within functional limits for tasks assessed                              MUSCLE LENGTH: Hamstrings: Right 70 deg; Left 70 deg Thomas test: Right 10 deg; Left 10 deg   POSTURE: No Significant postural limitations   LUMBAR ROM:    AROM eval 12/12 1/17 2/13  Flexion Fingertips 6 inches from toes 3 inches from toes Fingers to toes   Extension 15 20 20   $ Right lateral flexion 20 25 30   $ Left lateral flexion 20 25 30   $ Right rotation       Left rotation        (Blank rows = not tested)   TRUNK STRENGTH:  Decreased activation of transverse abdominus muscles; abdominals 4-/5; decreased activation of lumbar multifidi; trunk extensors 4-/5;  pain with bridge 2/13:  trunk strength grossly 4/5    1/17: LOWER EXTREMITY ROM:   WFLs   LOWER EXTREMITY MMT:  Pelvic drop with SLS;  needs mod assist of railings with 6 inch step down test right/left    MMT Right eval Left eval 12/12 1/17 2/13  Hip flexion 4 4 4+ 5 5  Hip extension  4 4 4+ Left 4+/right 5 5- left/5 right  Hip abduction 4- 4- 4 Left 4+/right 5 5- left/5/right  Hip adduction         Hip internal rotation         Hip external rotation         Knee flexion         Knee extension 4- 4- 4+ 4+ 5-/5 bil  Ankle dorsiflexion         Ankle plantarflexion         Ankle inversion         Ankle eversion          (Blank rows = not tested)     FUNCTIONAL TESTS:  5 times sit to stand: 12 sec no UE use SLS 3-5 sec  12/12:  7 sec right/left  1/17:  SLS right unlimited. Left 5 sec 5x sit to stand 12.97 no hands   2/13: Sit to stand 12.9 SLS :  8 sec on left, 5 sec on right   GAIT:   Comments: no impairment at slow or fast speed   TODAY'S TREATMENT:    2/20: Nu-step  5 min L3 while discuss status 30# barbell dead lifts to mid shin:  2 sets of 8 Every minute on the minute:  8 minutes 2 rounds Minute 1: step ups holding 8# 8 on each side Minute 2:  counter push ups 10x Minute 3:  split stance squats 10# 5 on each side Min 4: rest Lat bar 35# 20x Hip machine 40#: hip abduction, hip flexion and hip extension 10x each right/left RPE 2/10 at end of session Therapeutic activities:  sit to stand, standing, walking, negotiating curbs, steps, getting on/off  the floor       2/13: Nu-step with pillow behind the back 8 min L2 while discuss status 30# barbell from push up bars 12 inches tall:2 sets of 5 Every minute on the minute:  8 minutes Minute 1: step ups holding 5# 10 on each side Minute 2:  side stepping with blue band Minute 3:  5# snatch and press overhead Min 4: rest Lat bar 35# 20x Blue band resisted side step 6x each way Pink loop back step 8x right/left  Holding bar: back step/lunge back over blue pod 5x on each side Hip machine 40#: hip abduction, hip flexion and hip extension 10x each right/left RPE 8/10 at end of session Therapeutic activities:  sit to stand, standing, walking, negotiating curbs, steps, getting on/off the  floor      2/8: Nu-Step L3 old model 4 min, 4 min at L1 secondary to right thigh pain 2x 30 sec step ups holding 8# 1 side 8# staggered stance/heel up RDL 10x right/left 8# squats with weight under chin 10x 15# kettlebell squats in front of mat table 10x  15# kettlebell swings 10x Standing lat bar 35# 15x 30# barbell lift from 2 chairs (knee level) 10x Hip machine 40#: hip abduction, hip flexion and hip extension 10x each right/left  RPE end of session 4/10 Therapeutic activities:  sit to stand, standing, walking, negotiating curbs, steps, getting on/off the floor      PATIENT EDUCATION:  Education details: plan of care Person educated: Patient Education method: Explanation Education comprehension: verbalized understanding   HOME EXERCISE PROGRAM: Access Code: [email or web address] URL: [email or web address] Date: 10/26/2022 Prepared by: Ruben Im  Exercises - Sidelying Hip Abduction  - 1 x daily - 7 x weekly - 1 sets - 10 reps - Sit to Stand  - 1 x daily - 7 x weekly - 1 sets - 10 reps - Forward Step Up  - 1 x daily - 7 x weekly - 1 sets - 10 reps - Modified Thomas Stretch  - 1 x daily - 7 x weekly - 3 reps - 30 hold - Anti-Rotation Press With Sidesteps and Anchored Resistance  - 1 x daily - 7 x weekly - 1 sets - 10 reps - SNATCH AND PRESS OVERHEAD  - 1 x daily - 7 x weekly - 1 sets - 10 reps - Prone Hip Extension with Pillow Under Abdomen  - 1 x daily - 7 x weekly - 1 sets - 10 reps - Hooklying Clamshell with Resistance  - 1 x daily - 7 x weekly - 1 sets - 10 reps - Standing Clam with Resistance Loop  - 1 x daily - 7 x weekly - 1 sets - 10 reps - Standing Hip Abduction with Resistance at Thighs  - 1 x daily - 7 x weekly - 1 sets - 10 reps   ASSESSMENT:   CLINICAL IMPRESSION:  Patient reports back tightness/discomfort relieved following barbell dead lifts.  Verbal cues for head position and wider stance to enhance benefit.  Lower RPE post session compared to  last visit indicating improved strength and endurance.   Therapist monitoring technique and back pain response throughout session.     OBJECTIVE IMPAIRMENTS: decreased activity tolerance, decreased ROM, decreased strength, increased fascial restrictions, and pain.    ACTIVITY LIMITATIONS: stairs and transfers   PARTICIPATION LIMITATIONS: occupation   PERSONAL FACTORS:  good health  are also affecting patient's functional outcome.    REHAB POTENTIAL: Good   CLINICAL DECISION MAKING:  Stable/uncomplicated   EVALUATION COMPLEXITY: Low     GOALS: Goals reviewed with patient? Yes   SHORT TERM GOALS: Target date: 09/20/2022   The patient will demonstrate knowledge of basic self care strategies and exercises to promote healing   Baseline: Goal status:goal met 12/12   2.  The patient will have improved hip strength to at least 4/5 needed for getting up and down from the floor and descending stairs at home and in the community  Baseline:  Goal status: goal met 12/12   3.  The patient will have improved trunk flexor and extensor muscle strength to at least 4/5 needed for lifting light to medium weight objects   Baseline:  Goal status: goal met 12/12       LONG TERM GOALS: Target date: 12/11/2022   The patient will be independent in a safe self progression of a home exercise program to promote further recovery of function   Baseline:  Goal status: ongoing   2.  The patient will report minimal to no back pain with functional activities including work duties Baseline:  Goal status: goal met  3.  The patient will have improved trunk flexor and extensor muscle strength to at least 4+/5 needed for lifting medium weight objects such as  laundry and luggage  Baseline:  Goal status: goal met 1/17   4.  The patient will have improved left hip strength to at least 5-/5 needed for getting up off the floor and descending stairs at home and in the community and return to dancing Baseline:   Goal status: ongoing   5.  The patient will have improved FOTO score to   86%    indicating improved function with less pain  Baseline:  Goal status: ongoing   6. Improve gait speed to <53msec to be a community ambulator NEW     PLAN:   PT FREQUENCY: 1-2x/week   PT DURATION: 8 weeks   PLANNED INTERVENTIONS: Therapeutic exercises, Therapeutic activity, Neuromuscular re-education, Gait training, Patient/Family education, Self Care, Joint mobilization, Aquatic Therapy, Dry Needling, Electrical stimulation, Spinal manipulation, Spinal mobilization, Cryotherapy, Moist heat, Taping, Traction, Ultrasound, Ionotophoresis 441mml Dexamethasone, Manual therapy, and Re-evaluation.   PLAN FOR NEXT SESSION: dance: cha-cha or electric slide;   work on crawling (to help with on/off high bed);  left glute medius strengthening; prone over 1 pillow hip extension;  10 meter walk gait speed;    LE flexibility, lumbo/pelvic/hip strengthening; step ups, floor transfer practice  StRuben ImPT 11/29/22 4:10 PM Phone: [phone]: [phone]

## 2022-12-01 ENCOUNTER — Ambulatory Visit: Payer: No Typology Code available for payment source | Admitting: Podiatry

## 2022-12-01 ENCOUNTER — Encounter: Payer: Self-pay | Admitting: Podiatry

## 2022-12-01 DIAGNOSIS — Q828 Other specified congenital malformations of skin: Secondary | ICD-10-CM | POA: Diagnosis not present

## 2022-12-01 NOTE — Progress Notes (Signed)
Subjective:   Patient ID: Robin Martinez, female   DOB: 72 y.o.   MRN: [id number]   HPI Patient presents with very painful lesion subsecond fifth metatarsal right subfifth metatarsal left that have loosened corns and are very painful with walking   ROS      Objective:  Physical Exam  Neurovascular status intact painful keratotic lesions plantar aspect both feet with lucent cores     Assessment:  Porokeratosis bilateral     Plan:  Debridement painful porokeratotic lesion tolerated well reappoint routine care no iatrogenic bleeding noted

## 2022-12-06 ENCOUNTER — Ambulatory Visit: Payer: No Typology Code available for payment source | Admitting: Physical Therapy

## 2022-12-06 DIAGNOSIS — M5459 Other low back pain: Secondary | ICD-10-CM | POA: Diagnosis not present

## 2022-12-06 DIAGNOSIS — M6281 Muscle weakness (generalized): Secondary | ICD-10-CM

## 2022-12-06 NOTE — Therapy (Signed)
OUTPATIENT PHYSICAL THERAPY TREATMENT NOTE  Patient Name: Robin Martinez MRN: [id number] DOB:1950/11/28, 72 y.o., female Today's Date: 12/06/2022  PCP: Martinique, Betty MD  REFERRING PROVIDER: Martinique, Betty MD    END OF SESSION:   PT End of Session - 12/06/22 1453     Visit Number 12    Date for PT Re-Evaluation 12/21/22    Authorization Type [facility]    PT Start Time 1447    PT Stop Time 1529    PT Time Calculation (min) 42 min    Activity Tolerance Patient tolerated treatment well                  Past Medical History:  Diagnosis Date   Allergy    Arthritis    GERD (gastroesophageal reflux disease)    Hypertension    Past Surgical History:  Procedure Laterality Date   BREAST SURGERY     biopsy   ENDOVENOUS ABLATION SAPHENOUS VEIN W/ LASER Right 12/12/2018   endovenous laser ablation right greater saphenous vein by Ruta Hinds MD   RIGHT/LEFT HEART CATH AND CORONARY ANGIOGRAPHY N/A 03/10/2022   Procedure: RIGHT/LEFT HEART CATH AND CORONARY ANGIOGRAPHY;  Surgeon: Burnell Blanks, MD;  Location: [facility];  Service: Cardiovascular;  Laterality: N/A;   stab phlebectomy  Right 09/11/2019   stab phlebectomy > 20 incisions right leg by Ruta Hinds MD    TEE WITHOUT CARDIOVERSION N/A 03/10/2022   Procedure: TRANSESOPHAGEAL ECHOCARDIOGRAM (TEE);  Surgeon: Janina Mayo, MD;  Location: [facility];  Service: Cardiovascular;  Laterality: N/A;   Patient Active Problem List   Diagnosis Date Noted   Hypokalemia 10/14/2022   Nonrheumatic mitral valve regurgitation    Prediabetes 11/15/2021   GERD (gastroesophageal reflux disease) 11/15/2021   Hyperlipidemia 06/05/2019   Finger deformity, left 02/10/2018   Hypertension, essential, benign 03/14/2017   Class 1 obesity with body mass index (BMI) of 31.0 to 31.9 in adult 03/14/2017   Plantar callus 03/14/2017   Osteoarthritis, hand 03/14/2017    REFERRING DIAG: M54.50 acute right sided low back pain    THERAPY DIAG:  Other low back pain  Muscle weakness (generalized)  Rationale for Evaluation and Treatment Rehabilitation  PERTINENT HISTORY: Pronounced [name] -boor-rah At the end of May had surgery for heart blockage but when opened her up there was no blockage    PRECAUTIONS: None  SUBJECTIVE:                                                                                                                                                                                      SUBJECTIVE STATEMENT:   I felt good after last time.  I feel like I need to stretch.  I think I need a new mattress b/c I'm stiff in the mornings.  I got a step stool to get into my high bed and that helps a lot.    Has 5# and 8# weights at home  Are you having pain? No   OBJECTIVE: (objective measures completed at initial evaluation unless otherwise dated)   DIAGNOSTIC FINDINGS:  X-rays DDD lumbar   PATIENT SURVEYS:  FOTO 77% 1/17: 72% 2/13:  69%   COGNITION: Overall cognitive status: Within functional limits for tasks assessed                              MUSCLE LENGTH: Hamstrings: Right 70 deg; Left 70 deg Thomas test: Right 10 deg; Left 10 deg   POSTURE: No Significant postural limitations   LUMBAR ROM:    AROM eval 12/12 1/17 2/13  Flexion Fingertips 6 inches from toes 3 inches from toes Fingers to toes full  Extension '15 20 20 '$ full  Right lateral flexion '20 25 30 '$ full  Left lateral flexion '20 25 30 '$ full  Right rotation       Left rotation        (Blank rows = not tested)   TRUNK STRENGTH:  Decreased activation of transverse abdominus muscles; abdominals 4-/5; decreased activation of lumbar multifidi; trunk extensors 4-/5;  pain with bridge 2/13:  trunk strength grossly 4/5    1/17: LOWER EXTREMITY ROM:   WFLs   LOWER EXTREMITY MMT:  Pelvic drop with SLS;  needs mod assist of railings with 6 inch step down test right/left    MMT Right eval Left eval 12/12 1/17 2/13  Hip  flexion 4 4 4+ 5 5  Hip extension 4 4 4+ Left 4+/right 5 5- left/5 right  Hip abduction 4- 4- 4 Left 4+/right 5 5- left/5/right  Hip adduction         Hip internal rotation         Hip external rotation         Knee flexion         Knee extension 4- 4- 4+ 4+ 5-/5 bil  Ankle dorsiflexion         Ankle plantarflexion         Ankle inversion         Ankle eversion          (Blank rows = not tested)     FUNCTIONAL TESTS:  5 times sit to stand: 12 sec no UE use SLS 3-5 sec  12/12:  7 sec right/left  1/17:  SLS right unlimited. Left 5 sec 5x sit to stand 12.97 no hands   2/13: Sit to stand 12.9 SLS :  8 sec on left, 5 sec on right   GAIT:   Comments: no impairment at slow or fast speed   TODAY'S TREATMENT:    2/27 Nu-step  5 min L3 while discuss status 30# barbell dead lifts to mid shin:  10x 1st set, 10x 2nd set Every minute on the minute:  8 minutes 2 rounds Minute 1: step ups holding 8# 10 on each side Minute 2:  floor transfer 3x Minute 3: lat bar 35# 10x Min 4: rest 4 way stepping Forward, backward, side to side to circles with music ([employer] full song) RPE 2/10 at end of session Therapeutic activities:  sit to stand, standing, walking, negotiating curbs, steps, getting  on/off the floor     2/20: Nu-step  5 min L3 while discuss status 30# barbell dead lifts to mid shin:  2 sets of 8 Every minute on the minute:  8 minutes 2 rounds Minute 1: step ups holding 8# 8 on each side Minute 2:  counter push ups 10x Minute 3:  split stance squats 10# 5 on each side Min 4: rest Lat bar 35# 20x Hip machine 40#: hip abduction, hip flexion and hip extension 10x each right/left RPE 2/10 at end of session Therapeutic activities:  sit to stand, standing, walking, negotiating curbs, steps, getting on/off the floor       2/13: Nu-step with pillow behind the back 8 min L2 while discuss status 30# barbell from push up bars 12 inches tall:2 sets of 5 Every minute on  the minute:  8 minutes Minute 1: step ups holding 5# 10 on each side Minute 2:  side stepping with blue band Minute 3:  5# snatch and press overhead Min 4: rest Lat bar 35# 20x Blue band resisted side step 6x each way Pink loop back step 8x right/left  Holding bar: back step/lunge back over blue pod 5x on each side Hip machine 40#: hip abduction, hip flexion and hip extension 10x each right/left RPE 8/10 at end of session Therapeutic activities:  sit to stand, standing, walking, negotiating curbs, steps, getting on/off the floor    PATIENT EDUCATION:  Education details: plan of care Person educated: Patient Education method: Explanation Education comprehension: verbalized understanding   HOME EXERCISE PROGRAM: Access Code: [email or web address] URL: [email or web address] Date: 10/26/2022 Prepared by: Ruben Im  Exercises - Sidelying Hip Abduction  - 1 x daily - 7 x weekly - 1 sets - 10 reps - Sit to Stand  - 1 x daily - 7 x weekly - 1 sets - 10 reps - Forward Step Up  - 1 x daily - 7 x weekly - 1 sets - 10 reps - Modified Thomas Stretch  - 1 x daily - 7 x weekly - 3 reps - 30 hold - Anti-Rotation Press With Sidesteps and Anchored Resistance  - 1 x daily - 7 x weekly - 1 sets - 10 reps - SNATCH AND PRESS OVERHEAD  - 1 x daily - 7 x weekly - 1 sets - 10 reps - Prone Hip Extension with Pillow Under Abdomen  - 1 x daily - 7 x weekly - 1 sets - 10 reps - Hooklying Clamshell with Resistance  - 1 x daily - 7 x weekly - 1 sets - 10 reps - Standing Clam with Resistance Loop  - 1 x daily - 7 x weekly - 1 sets - 10 reps - Standing Hip Abduction with Resistance at Thighs  - 1 x daily - 7 x weekly - 1 sets - 10 reps   ASSESSMENT:   CLINICAL IMPRESSION:  The patient is progressing well with back pain reduction, functional strength and endurance.  She is able to perform 6 reps of floor transfers without assist from the furniture to get up or down.  She hopes to return to dancing and  was able to do a balance/multi directional stepping for the length of a song without difficulty.  Although she reports some ex's were challenging her rating of overall perceived exertion is low.  Therapist monitoring response throughout treatment session.    OBJECTIVE IMPAIRMENTS: decreased activity tolerance, decreased ROM, decreased strength, increased fascial restrictions, and pain.    ACTIVITY LIMITATIONS: stairs and  transfers   PARTICIPATION LIMITATIONS: occupation   PERSONAL FACTORS:  good health  are also affecting patient's functional outcome.    REHAB POTENTIAL: Good   CLINICAL DECISION MAKING: Stable/uncomplicated   EVALUATION COMPLEXITY: Low     GOALS: Goals reviewed with patient? Yes   SHORT TERM GOALS: Target date: 09/20/2022   The patient will demonstrate knowledge of basic self care strategies and exercises to promote healing   Baseline: Goal status:goal met 12/12   2.  The patient will have improved hip strength to at least 4/5 needed for getting up and down from the floor and descending stairs at home and in the community  Baseline:  Goal status: goal met 12/12   3.  The patient will have improved trunk flexor and extensor muscle strength to at least 4/5 needed for lifting light to medium weight objects   Baseline:  Goal status: goal met 12/12       LONG TERM GOALS: Target date: 12/11/2022   The patient will be independent in a safe self progression of a home exercise program to promote further recovery of function   Baseline:  Goal status: ongoing   2.  The patient will report minimal to no back pain with functional activities including work duties Baseline:  Goal status: goal met  3.  The patient will have improved trunk flexor and extensor muscle strength to at least 4+/5 needed for lifting medium weight objects such as  laundry and luggage  Baseline:  Goal status: goal met 1/17   4.  The patient will have improved left hip strength to at least  5-/5 needed for getting up off the floor and descending stairs at home and in the community and return to dancing Baseline:  Goal status: ongoing   5.  The patient will have improved FOTO score to   86%    indicating improved function with less pain  Baseline:  Goal status: ongoing   6. Improve gait speed to <35msec to be a community ambulator NEW     PLAN:   PT FREQUENCY: 1-2x/week   PT DURATION: 8 weeks   PLANNED INTERVENTIONS: Therapeutic exercises, Therapeutic activity, Neuromuscular re-education, Gait training, Patient/Family education, Self Care, Joint mobilization, Aquatic Therapy, Dry Needling, Electrical stimulation, Spinal manipulation, Spinal mobilization, Cryotherapy, Moist heat, Taping, Traction, Ultrasound, Ionotophoresis '4mg'$ /ml Dexamethasone, Manual therapy, and Re-evaluation.   PLAN FOR NEXT SESSION: dance: cha-cha or electric slide;   work on crawling or floor transfers;  left glute medius strengthening;     LE flexibility, lumbo/pelvic/hip strengthening; step ups  SRuben Im PT 12/06/22 3:21 PM Phone: [phone]: [phone]

## 2022-12-07 NOTE — Progress Notes (Unsigned)
ACUTE VISIT Chief Complaint  Patient presents with   Weight Loss   HPI: [name] is a 72 y.o. female, who is here today concerned about not being able to lose wt.  She reports that her weight has been stable since last year, but she desires to shed some pounds.  She has been cooking at home more frequently, eating out less, and limiting her intake of breads and sweets. She reports eating fish, chicken, ground [place], and occasionally potatoes and rice.  She has a history of plantar fasciitis, which has limited her ability to exercise regularly. Following with podiatrist. She used to walk up and down hills daily but has since transitioned to using a treadmill in the morning for 15 minutes almost daily.   States that her main issue is her cravings for sweets and bread, hard to controlled.  She reports that she has been eating raisin bran cereal  with low fat milk for breakfast, snacking on fruits and boiled eggs. Yesterday she did not have lunch, had some grapes. Crab cakes x 2 for dinner, doe snot recall eating something else. Vegetables and pizza today for lunch  She admits to not measuring her cereal portions and not eating many vegetables.   She had bariatric surgery 34 years ago and has been taking vitamin B12 daily. She has not had vits checked. Lab Results  Component Value Date   CREATININE 0.78 10/14/2022   BUN 16 10/14/2022   NA 141 10/14/2022   K 4.2 10/14/2022   CL 104 10/14/2022   CO2 31 10/14/2022   Lab Results  Component Value Date   TSH 2.43 11/15/2021   Prediabetes: Negative for abdominal pain, nausea,vomiting, polydipsia,polyuria, or polyphagia.  Lab Results  Component Value Date   HGBA1C 5.9 10/14/2022   Review of Systems  Constitutional:  Negative for chills, fever and unexpected weight change.  Respiratory:  Negative for cough, shortness of breath and wheezing.   Endocrine: Negative for cold intolerance and heat intolerance.  Genitourinary:   Negative for decreased urine volume, dysuria and hematuria.  Musculoskeletal:  Negative for gait problem.  Neurological:  Negative for syncope, weakness and numbness.  See other pertinent positives and negatives in HPI.  Current Outpatient Medications on File Prior to Visit  Medication Sig Dispense Refill   aspirin EC 81 MG tablet Take 81 mg by mouth daily. Swallow whole.     cholecalciferol (VITAMIN D3) 25 MCG (1000 UNIT) tablet Take 1,000 Units by mouth daily.     Flaxseed, Linseed, (FLAXSEED OIL) 1000 MG CAPS Take 1,000 mg by mouth daily.     triamterene-hydrochlorothiazide (MAXZIDE-25) 37.5-25 MG tablet Take 1 tablet by mouth daily. Appointment due 90 tablet 2   vitamin E 180 MG (400 UNITS) capsule Take 400 Units by mouth daily.     No current facility-administered medications on file prior to visit.   Past Medical History:  Diagnosis Date   Allergy    Arthritis    GERD (gastroesophageal reflux disease)    Hypertension    No Known Allergies  Social History   Socioeconomic History   Marital status: Married    Spouse name: Not on file   Number of children: Not on file   Years of education: Not on file   Highest education level: Not on file  Occupational History   Not on file  Tobacco Use   Smoking status: Never   Smokeless tobacco: Never  Substance and Sexual Activity   Alcohol use: No  Drug use: No   Sexual activity: Not Currently  Other Topics Concern   Not on file  Social History Narrative   Not on file   Social Determinants of Health   Financial Resource Strain: Low Risk  (06/01/2022)   Overall Financial Resource Strain (CARDIA)    Difficulty of Paying Living Expenses: Not hard at all  Food Insecurity: No Food Insecurity (06/01/2022)   Hunger Vital Sign    Worried About Running Out of Food in the Last Year: Never true    Ran Out of Food in the Last Year: Never true  Transportation Needs: No Transportation Needs (06/01/2022)   PRAPARE - Armed forces logistics/support/administrative officer (Medical): No    Lack of Transportation (Non-Medical): No  Physical Activity: Insufficiently Active (06/01/2022)   Exercise Vital Sign    Days of Exercise per Week: 3 days    Minutes of Exercise per Session: 30 min  Stress: No Stress Concern Present (06/01/2022)   [place]    Feeling of Stress : Not at all  Social Connections: [place] (06/01/2022)   Social Connection and Isolation Panel [NHANES]    Frequency of Communication with Friends and Family: More than three times a week    Frequency of Social Gatherings with Friends and Family: More than three times a week    Attends Religious Services: More than 4 times per year    Active Member of [employer] or Organizations: Yes    Attends Archivist Meetings: More than 4 times per year    Marital Status: Married   Vitals:   12/09/22 1418  BP: 128/80  Pulse: 88  Resp: 16  Temp: 97.9 F (36.6 C)  SpO2: 98%   Wt Readings from Last 3 Encounters:  12/09/22 179 lb 2 oz (81.3 kg)  10/14/22 181 lb 4 oz (82.2 kg)  08/09/22 179 lb 8 oz (81.4 kg)   Body mass index is 32.76 kg/m.  Physical Exam Vitals and nursing note reviewed.  Constitutional:      General: She is not in acute distress.    Appearance: She is well-developed.  HENT:     Head: Normocephalic and atraumatic.  Eyes:     Conjunctiva/sclera: Conjunctivae normal.  Cardiovascular:     Rate and Rhythm: Normal rate and regular rhythm.     Heart sounds: No murmur heard. Pulmonary:     Effort: Pulmonary effort is normal. No respiratory distress.     Breath sounds: Normal breath sounds.  Abdominal:     Palpations: Abdomen is soft. There is no mass.     Tenderness: There is no abdominal tenderness.  Musculoskeletal:     Right lower leg: No edema.     Left lower leg: No edema.  Skin:    General: Skin is warm.     Findings: No erythema or rash.  Neurological:      General: No focal deficit present.     Mental Status: She is alert and oriented to person, place, and time.     Cranial Nerves: No cranial nerve deficit.     Gait: Gait normal.  Psychiatric:        Mood and Affect: Mood and affect normal.   ASSESSMENT AND PLAN:  [name] was seen today to discuss wt loss. Class 1 obesity with serious comorbidity and body mass index (BMI) of 31.0 to 31.9 in adult, unspecified obesity type Assessment & Plan: Her weight has  been stable. We discussed her dietary choices in the past 24-48 hours, healthier suggestions given. She understands the benefits of wt loss as well as adverse effects of obesity. Consistency with healthy diet and physical activity encouraged. She is interested in pharmacologic treatment, which can be discussed with provider at [facility], referral placed.  Orders: -     Amb Ref to Medical Weight Management  S/P bariatric surgery Assessment & Plan: She prefers to hold on blood work for now, will most likely be done when she establish with [facility]. No daily multivitamin and B12 supplementation.   Prediabetes Assessment & Plan: Encouraged consistency with a healthier lifestyle for diabetes prevention. Last hemoglobin A1c 5.9 on 10/14/2022.   I spent a total of 30 minutes in both face to face and non face to face activities for this visit on the date of this encounter. During this time history was obtained and documented, examination was performed, prior labs reviewed, and assessment/plan discussed.  Return if symptoms worsen or fail to improve, for keep next appointment.  Lejend Dalby G. Martinique, MD  [facility] [place]. [place] office.

## 2022-12-09 ENCOUNTER — Encounter: Payer: Self-pay | Admitting: Family Medicine

## 2022-12-09 ENCOUNTER — Ambulatory Visit (INDEPENDENT_AMBULATORY_CARE_PROVIDER_SITE_OTHER): Payer: No Typology Code available for payment source | Admitting: Family Medicine

## 2022-12-09 VITALS — BP 128/80 | HR 88 | Temp 97.9°F | Resp 16 | Ht 62.0 in | Wt 179.1 lb

## 2022-12-09 DIAGNOSIS — R7303 Prediabetes: Secondary | ICD-10-CM

## 2022-12-09 DIAGNOSIS — E669 Obesity, unspecified: Secondary | ICD-10-CM | POA: Diagnosis not present

## 2022-12-09 DIAGNOSIS — Z9884 Bariatric surgery status: Secondary | ICD-10-CM | POA: Insufficient documentation

## 2022-12-09 DIAGNOSIS — Z6831 Body mass index (BMI) 31.0-31.9, adult: Secondary | ICD-10-CM

## 2022-12-09 NOTE — Patient Instructions (Signed)
A few things to remember from today's visit:  Class 1 obesity with serious comorbidity and body mass index (BMI) of 31.0 to 31.9 in adult, unspecified obesity type - Plan: Amb Ref to Medical Weight Management  S/P bariatric surgery  Do not use My Chart to request refills or for acute issues that need immediate attention. If you send a my chart message, it may take a few days to be addressed, specially if I am not in the office.  Please be sure medication list is accurate. If a new problem present, please set up appointment sooner than planned today.

## 2022-12-10 NOTE — Assessment & Plan Note (Addendum)
Her weight has been stable. We discussed her dietary choices in the past 24-48 hours, healthier suggestions given. She understands the benefits of wt loss as well as adverse effects of obesity. Consistency with healthy diet and physical activity encouraged. She is interested in pharmacologic treatment, which can be discussed with provider at [facility], referral placed.

## 2022-12-10 NOTE — Addendum Note (Signed)
Addended by: Martinique, Lawanda Holzheimer G on: 12/10/2022 06:38 PM   Modules accepted: Level of Service

## 2022-12-10 NOTE — Assessment & Plan Note (Addendum)
Encouraged consistency with a healthier lifestyle for diabetes prevention. Last hemoglobin A1c 5.9 on 10/14/2022.

## 2022-12-10 NOTE — Assessment & Plan Note (Signed)
She prefers to hold on blood work for now, will most likely be done when she establish with [facility]. No daily multivitamin and B12 supplementation.

## 2022-12-13 ENCOUNTER — Ambulatory Visit: Payer: No Typology Code available for payment source | Attending: Family Medicine | Admitting: Physical Therapy

## 2022-12-13 DIAGNOSIS — M5459 Other low back pain: Secondary | ICD-10-CM | POA: Insufficient documentation

## 2022-12-13 DIAGNOSIS — M6281 Muscle weakness (generalized): Secondary | ICD-10-CM | POA: Diagnosis not present

## 2022-12-13 NOTE — Therapy (Addendum)
OUTPATIENT PHYSICAL THERAPY TREATMENT NOTE/DISCHARGE SUMMARY  Patient Name: Robin Martinez MRN: [id number] DOB:11-Jan-1951, 72 y.o., female Today's Date: 12/13/2022  PCP: Martinique, Betty MD  REFERRING PROVIDER: Martinique, Betty MD    END OF SESSION:   PT End of Session - 12/13/22 1527     Visit Number 13    Date for PT Re-Evaluation 12/21/22    Authorization Type [facility]    PT Start Time 1530    PT Stop Time 7209    PT Time Calculation (min) 43 min    Activity Tolerance Patient tolerated treatment well                  Past Medical History:  Diagnosis Date   Allergy    Arthritis    GERD (gastroesophageal reflux disease)    Hypertension    Past Surgical History:  Procedure Laterality Date   BREAST SURGERY     biopsy   ENDOVENOUS ABLATION SAPHENOUS VEIN W/ LASER Right 12/12/2018   endovenous laser ablation right greater saphenous vein by Ruta Hinds MD   RIGHT/LEFT HEART CATH AND CORONARY ANGIOGRAPHY N/A 03/10/2022   Procedure: RIGHT/LEFT HEART CATH AND CORONARY ANGIOGRAPHY;  Surgeon: Burnell Blanks, MD;  Location: [facility];  Service: Cardiovascular;  Laterality: N/A;   stab phlebectomy  Right 09/11/2019   stab phlebectomy > 20 incisions right leg by Ruta Hinds MD    TEE WITHOUT CARDIOVERSION N/A 03/10/2022   Procedure: TRANSESOPHAGEAL ECHOCARDIOGRAM (TEE);  Surgeon: Janina Mayo, MD;  Location: [facility];  Service: Cardiovascular;  Laterality: N/A;   Patient Active Problem List   Diagnosis Date Noted   S/P bariatric surgery 12/09/2022   Hypokalemia 10/14/2022   Nonrheumatic mitral valve regurgitation    Prediabetes 11/15/2021   GERD (gastroesophageal reflux disease) 11/15/2021   Hyperlipidemia 06/05/2019   Finger deformity, left 02/10/2018   Hypertension, essential, benign 03/14/2017   Class 1 obesity with body mass index (BMI) of 31.0 to 31.9 in adult 03/14/2017   Plantar callus 03/14/2017   Osteoarthritis, hand 03/14/2017     REFERRING DIAG: M54.50 acute right sided low back pain   THERAPY DIAG:  Other low back pain  Muscle weakness (generalized)  Rationale for Evaluation and Treatment Rehabilitation  PERTINENT HISTORY: Pronounced [name] -boor-rah At the end of May had surgery for heart blockage but when opened her up there was no blockage    PRECAUTIONS: None  SUBJECTIVE:                                                                                                                                                                                      SUBJECTIVE STATEMENT:  My week has been OK,  getting on/off the bed has been better with step stool.  I've had no problems with my back.  I think I need more work on the stairs and the barbell .  Has 5# and 8# weights at home  Are you having pain? No   OBJECTIVE: (objective measures completed at initial evaluation unless otherwise dated)   DIAGNOSTIC FINDINGS:  X-rays DDD lumbar   PATIENT SURVEYS:  FOTO 77% 1/17: 72% 2/13:  69%   COGNITION: Overall cognitive status: Within functional limits for tasks assessed                              MUSCLE LENGTH: Hamstrings: Right 70 deg; Left 70 deg Thomas test: Right 10 deg; Left 10 deg   POSTURE: No Significant postural limitations   LUMBAR ROM:    AROM eval 12/12 1/17 2/13  Flexion Fingertips 6 inches from toes 3 inches from toes Fingers to toes full  Extension 15 20 20  full  Right lateral flexion 20 25 30  full  Left lateral flexion 20 25 30  full  Right rotation       Left rotation        (Blank rows = not tested)   TRUNK STRENGTH:  Decreased activation of transverse abdominus muscles; abdominals 4-/5; decreased activation of lumbar multifidi; trunk extensors 4-/5;  pain with bridge 2/13:  trunk strength grossly 4/5    1/17: LOWER EXTREMITY ROM:   WFLs   LOWER EXTREMITY MMT:  Pelvic drop with SLS;  needs mod assist of railings with 6 inch step down test right/left    MMT  Right eval Left eval 12/12 1/17 2/13  Hip flexion 4 4 4+ 5 5  Hip extension 4 4 4+ Left 4+/right 5 5- left/5 right  Hip abduction 4- 4- 4 Left 4+/right 5 5- left/5/right  Hip adduction         Hip internal rotation         Hip external rotation         Knee flexion         Knee extension 4- 4- 4+ 4+ 5-/5 bil  Ankle dorsiflexion         Ankle plantarflexion         Ankle inversion         Ankle eversion          (Blank rows = not tested)     FUNCTIONAL TESTS:  5 times sit to stand: 12 sec no UE use SLS 3-5 sec  12/12:  7 sec right/left  1/17:  SLS right unlimited. Left 5 sec 5x sit to stand 12.97 no hands   2/13: Sit to stand 12.9 SLS :  8 sec on left, 5 sec on right   GAIT:   Comments: no impairment at slow or fast speed   TODAY'S TREATMENT:    3/5: Nu-step  6 min L5 while discuss status 30# barbell dead lifts to mid shin:  10x 1st set, 10x 2nd set Every minute on the minute:  8 minutes 2 rounds Minute 1: lat bar 35# 10x Minute 2:  step ups holding 10# 10x on each side Minute 3: floor transfer 2x Min 4: rest Facing door single arm shoulder extension green band with opposite hip flexion 10x each side  Diagonal extensions with green band in staggered stance/kickstand position 10x right/left Leaning over on tall table single leg hip extension  10x right/left Green band pallof hold at 90 degrees small trunk rotation 10x right/left 10# lateral side bend then use core muscles to straighten up 10x each side Holding 10# on side with march keeping trunk straight 10x each side RPE 6/10 at end of session Therapeutic activities:  sit to stand, standing, walking, negotiating curbs, steps, getting on/off the floor       2/27 Nu-step  5 min L3 while discuss status 30# barbell dead lifts to mid shin:  10x 1st set, 10x 2nd set Every minute on the minute:  8 minutes 2 rounds Minute 1: step ups holding 8# 10 on each side Minute 2:  floor transfer 3x Minute 3: lat bar 35#  10x Min 4: rest 4 way stepping Forward, backward, side to side to circles with music ([employer] full song) RPE 2/10 at end of session Therapeutic activities:  sit to stand, standing, walking, negotiating curbs, steps, getting on/off the floor     2/20: Nu-step  5 min L3 while discuss status 30# barbell dead lifts to mid shin:  2 sets of 8 Every minute on the minute:  8 minutes 2 rounds Minute 1: step ups holding 8# 8 on each side Minute 2:  counter push ups 10x Minute 3:  split stance squats 10# 5 on each side Min 4: rest Lat bar 35# 20x Hip machine 40#: hip abduction, hip flexion and hip extension 10x each right/left RPE 2/10 at end of session Therapeutic activities:  sit to stand, standing, walking, negotiating curbs, steps, getting on/off the floor      PATIENT EDUCATION:  Education details: plan of care Person educated: Patient Education method: Explanation Education comprehension: verbalized understanding   HOME EXERCISE PROGRAM: Access Code: [email or web address] URL: [email or web address] Date: 10/26/2022 Prepared by: Ruben Im  Exercises - Sidelying Hip Abduction  - 1 x daily - 7 x weekly - 1 sets - 10 reps - Sit to Stand  - 1 x daily - 7 x weekly - 1 sets - 10 reps - Forward Step Up  - 1 x daily - 7 x weekly - 1 sets - 10 reps - Modified Thomas Stretch  - 1 x daily - 7 x weekly - 3 reps - 30 hold - Anti-Rotation Press With Sidesteps and Anchored Resistance  - 1 x daily - 7 x weekly - 1 sets - 10 reps - SNATCH AND PRESS OVERHEAD  - 1 x daily - 7 x weekly - 1 sets - 10 reps - Prone Hip Extension with Pillow Under Abdomen  - 1 x daily - 7 x weekly - 1 sets - 10 reps - Hooklying Clamshell with Resistance  - 1 x daily - 7 x weekly - 1 sets - 10 reps - Standing Clam with Resistance Loop  - 1 x daily - 7 x weekly - 1 sets - 10 reps - Standing Hip Abduction with Resistance at Thighs  - 1 x daily - 7 x weekly - 1 sets - 10 reps   ASSESSMENT:   CLINICAL  IMPRESSION:  Able to increase load and intensity with ex's today without difficulty.  Able to perform floor transfers with ease now.  No back pain with any ex's  performed but feels challenged per rating of perceived exertion (RPE).  Therapist monitoring response and providing cues to optimize ex effectiveness.  Will assess progress toward goals next visit and anticipate discharge next visit.      OBJECTIVE IMPAIRMENTS: decreased activity tolerance, decreased ROM, decreased strength, increased  fascial restrictions, and pain.    ACTIVITY LIMITATIONS: stairs and transfers   PARTICIPATION LIMITATIONS: occupation   PERSONAL FACTORS:  good health  are also affecting patient's functional outcome.    REHAB POTENTIAL: Good   CLINICAL DECISION MAKING: Stable/uncomplicated   EVALUATION COMPLEXITY: Low     GOALS: Goals reviewed with patient? Yes   SHORT TERM GOALS: Target date: 09/20/2022   The patient will demonstrate knowledge of basic self care strategies and exercises to promote healing   Baseline: Goal status:goal met 12/12   2.  The patient will have improved hip strength to at least 4/5 needed for getting up and down from the floor and descending stairs at home and in the community  Baseline:  Goal status: goal met 12/12   3.  The patient will have improved trunk flexor and extensor muscle strength to at least 4/5 needed for lifting light to medium weight objects   Baseline:  Goal status: goal met 12/12       LONG TERM GOALS: Target date: 12/11/2022   The patient will be independent in a safe self progression of a home exercise program to promote further recovery of function   Baseline:  Goal status: ongoing   2.  The patient will report minimal to no back pain with functional activities including work duties Baseline:  Goal status: goal met  3.  The patient will have improved trunk flexor and extensor muscle strength to at least 4+/5 needed for lifting medium weight objects  such as  laundry and luggage  Baseline:  Goal status: goal met 1/17   4.  The patient will have improved left hip strength to at least 5-/5 needed for getting up off the floor and descending stairs at home and in the community and return to dancing Baseline:  Goal status: ongoing   5.  The patient will have improved FOTO score to   86%    indicating improved function with less pain  Baseline:  Goal status: ongoing   6. Improve gait speed to <97m/sec to be a community ambulator NEW     PLAN:   PT FREQUENCY: 1-2x/week   PT DURATION: 8 weeks   PLANNED INTERVENTIONS: Therapeutic exercises, Therapeutic activity, Neuromuscular re-education, Gait training, Patient/Family education, Self Care, Joint mobilization, Aquatic Therapy, Dry Needling, Electrical stimulation, Spinal manipulation, Spinal mobilization, Cryotherapy, Moist heat, Taping, Traction, Ultrasound, Ionotophoresis 4mg /ml Dexamethasone, Manual therapy, and Re-evaluation.   PLAN FOR NEXT SESSION:check progress toward goals, probable discharge next visit;  dance: cha-cha or electric slide;   work on crawling or floor transfers;  left glute medius strengthening;     LE flexibility, lumbo/pelvic/hip strengthening; step ups     Ruben Im, PT 12/13/22 4:12 PM Phone: [phone] Fax: [phone]     PHYSICAL THERAPY DISCHARGE SUMMARY  Visits from Start of Care: 13  Current functional level related to goals / functional outcomes: The patient called to cancel her last scheduled appt and expresses readiness for discharge since she is doing so well.  Although remaining goals unable to be reassessed in the clinic, she most likely has met all of them    Remaining deficits: As above   Education / Equipment: HEP   Patient agrees to discharge. Patient goals were partially met. Patient is being discharged due to meeting the stated rehab goals.  Ruben Im, PT 12/16/22 7:53 AM Phone: [phone] Fax: [phone]

## 2022-12-20 ENCOUNTER — Encounter: Admitting: Physical Therapy

## 2022-12-20 ENCOUNTER — Encounter: Payer: Self-pay | Admitting: Family Medicine

## 2022-12-20 NOTE — Telephone Encounter (Signed)
Can you check on this?

## 2023-02-07 LAB — HM MAMMOGRAPHY

## 2023-02-20 ENCOUNTER — Ambulatory Visit (INDEPENDENT_AMBULATORY_CARE_PROVIDER_SITE_OTHER): Payer: No Typology Code available for payment source | Admitting: Podiatry

## 2023-02-20 ENCOUNTER — Encounter: Payer: Self-pay | Admitting: Podiatry

## 2023-02-20 DIAGNOSIS — Q828 Other specified congenital malformations of skin: Secondary | ICD-10-CM | POA: Diagnosis not present

## 2023-02-20 NOTE — Progress Notes (Signed)
Subjective:   Patient ID: Robin Martinez, female   DOB: 72 y.o.   MRN: [id number]   HPI Patient presents with painful lesions underneath both feet that she cannot take care of   ROS      Objective:  Physical Exam  Neurovascular status intact keratotic lesions plantar bilateral lucent course painful to press     Assessment:  Chronic porokeratotic lesion formation bilateral     Plan:  Debrided painful lesions bilateral no angiogenic bleeding reappoint routine care

## 2023-02-26 ENCOUNTER — Encounter: Payer: Self-pay | Admitting: Family Medicine

## 2023-02-27 ENCOUNTER — Ambulatory Visit (INDEPENDENT_AMBULATORY_CARE_PROVIDER_SITE_OTHER): Payer: No Typology Code available for payment source | Admitting: Physician Assistant

## 2023-02-27 ENCOUNTER — Encounter (INDEPENDENT_AMBULATORY_CARE_PROVIDER_SITE_OTHER): Payer: Self-pay | Admitting: Physician Assistant

## 2023-02-27 VITALS — BP 175/100 | HR 58 | Temp 97.9°F | Ht 62.0 in | Wt 175.0 lb

## 2023-02-27 DIAGNOSIS — R7303 Prediabetes: Secondary | ICD-10-CM | POA: Diagnosis not present

## 2023-02-27 DIAGNOSIS — Z9884 Bariatric surgery status: Secondary | ICD-10-CM | POA: Diagnosis not present

## 2023-02-27 DIAGNOSIS — Z0289 Encounter for other administrative examinations: Secondary | ICD-10-CM

## 2023-02-27 DIAGNOSIS — Z6832 Body mass index (BMI) 32.0-32.9, adult: Secondary | ICD-10-CM | POA: Diagnosis not present

## 2023-02-27 DIAGNOSIS — I10 Essential (primary) hypertension: Secondary | ICD-10-CM

## 2023-02-27 DIAGNOSIS — Z6830 Body mass index (BMI) 30.0-30.9, adult: Secondary | ICD-10-CM | POA: Insufficient documentation

## 2023-02-27 NOTE — Progress Notes (Addendum)
Office: [phone]  /  Fax: [phone]   Initial Visit  Robin Martinez was seen in clinic today to evaluate for obesity. She is interested in losing weight to improve overall health and reduce the risk of weight related complications. She presents today to review program treatment options, initial physical assessment, and evaluation.     She was referred by: PCP  When asked what else they would like to accomplish? She states: Adopt healthier eating patterns, Improve energy levels and physical activity, Improve existing medical conditions, Improve quality of life, and Lose a target amount of weight : 40 lbs  Weight history: Started weight watchers as teenager.    S/P Gastric Bypass 34 yrs ago in [place].    Highest weight 202 lbs. Lost 60 lbs with Gastric Bypass.   Kept weight off for ~ 10 years, then gradually started to regain weight.   When asked how has your weight affected you? She states: Has affected self-esteem, Contributed to medical problems, Problems with eating patterns, and Has affected mood   Some associated conditions: Hypertension, Prediabetes, and GERD  Contributing factors: Nutritional, Stress, and Eating patterns  Weight promoting medications identified: None  Current nutrition plan: None and Low-carb Does not eat red meat.   Current level of physical activity: Walking and Other: Was going to gym/walking treadmill but not consistently over the past month.   Current or previous pharmacotherapy: Other: Remotely on something for weight loss in high school  Response to medication: Lost weight and was able to maintain weight loss as teenager until after birth of daughter.    Past medical history includes:   Past Medical History:  Diagnosis Date   Allergy    Arthritis    GERD (gastroesophageal reflux disease)    Hypertension      Objective:   BP (!) 175/100   Pulse (!) 58   Temp 97.9 F (36.6 C)   Ht 5\' 2"  (1.575 m)   Wt 175 lb (79.4 kg)   SpO2  99%   BMI 32.01 kg/m  She was weighed on the bioimpedance scale: Body mass index is 32.01 kg/m.  Peak Weight: 202 lbs , Body Fat%:45.9, Visceral Fat Rating:13, Weight trend over the last 12 months: Increasing  General:  Alert, oriented and cooperative. Patient is in no acute distress.  Respiratory: Normal respiratory effort, no problems with respiration noted   Gait: able to ambulate independently  Mental Status: Normal mood and affect. Normal behavior. Normal judgment and thought content.   DIAGNOSTIC DATA REVIEWED:  BMET    Component Value Date/Time   NA 141 10/14/2022 1424   NA 143 03/08/2022 1402   K 4.2 10/14/2022 1424   CL 104 10/14/2022 1424   CO2 31 10/14/2022 1424   GLUCOSE 95 10/14/2022 1424   BUN 16 10/14/2022 1424   BUN 17 03/08/2022 1402   CREATININE 0.78 10/14/2022 1424   CREATININE 0.90 06/02/2020 1024   CALCIUM 9.6 10/14/2022 1424   Lab Results  Component Value Date   HGBA1C 5.9 10/14/2022   HGBA1C 5.9 01/08/2013   No results found for: "INSULIN" CBC    Component Value Date/Time   WBC 4.6 03/08/2022 1402   WBC 4.4 01/25/2022 1032   RBC 4.78 03/08/2022 1402   RBC 4.84 01/25/2022 1032   HGB 11.9 (L) 03/10/2022 1003   HGB 14.0 03/08/2022 1402   HCT 35.0 (L) 03/10/2022 1003   HCT 41.4 03/08/2022 1402   PLT 241 03/08/2022 1402   MCV 87 03/08/2022 1402  MCH 29.3 03/08/2022 1402   MCHC 33.8 03/08/2022 1402   MCHC 33.2 01/25/2022 1032   RDW 12.1 03/08/2022 1402   Iron/TIBC/Ferritin/ %Sat No results found for: "IRON", "TIBC", "FERRITIN", "IRONPCTSAT" Lipid Panel     Component Value Date/Time   CHOL 198 11/15/2021 0930   TRIG 82.0 11/15/2021 0930   HDL 63.70 11/15/2021 0930   CHOLHDL 3 11/15/2021 0930   VLDL 16.4 11/15/2021 0930   LDLCALC 117 (H) 11/15/2021 0930   LDLCALC 130 (H) 06/02/2020 1024   Hepatic Function Panel     Component Value Date/Time   PROT 6.3 11/15/2021 0930   ALBUMIN 3.9 11/15/2021 0930   AST 15 11/15/2021 0930   ALT 16  11/15/2021 0930   ALKPHOS 85 11/15/2021 0930   BILITOT 0.8 11/15/2021 0930      Component Value Date/Time   TSH 2.43 11/15/2021 0930     Assessment and Plan:   Prediabetes  S/P bariatric surgery  Hypertension, essential, benign  BMI 32.0-32.9,adult BMI 32.1  Prediabetes Last A1c was 5.9. She was not aware that this was in the prediabetic range. Reports mother was diabetic.   Medication(s): None Polyphagia:Yes at times with increased stress/comfort eating.  Lab Results  Component Value Date   HGBA1C 5.9 10/14/2022   HGBA1C 6.0 11/15/2021   HGBA1C 5.4 06/02/2020   HGBA1C 5.9 06/13/2019   HGBA1C 5.7 10/30/2017   No results found for: "INSULIN"  Plan: This may result in diabetic complications without diagnosis of diabetes and therefore warrants further treatment. Losing 10% of body weight and increasing physical activity to 150 minutes a week may improve condition. She may also benefit for pharmacoprophylaxis with metformin and or incretin therapy.   Will plan to discuss nutrition plan and exercise to promote improvement in A1C and insulin levels  Hypertension Hypertension asymptomatic, no significant medication side effects noted, control uncertain, needs further observation, and needs improvement.  Medication(s): triamterene-HCTZ 37.5-25 mg daily.    BP Readings from Last 3 Encounters:  02/27/23 (!) 175/100  12/09/22 128/80  10/14/22 130/80   Lab Results  Component Value Date   CREATININE 0.78 10/14/2022   CREATININE 0.92 03/08/2022   CREATININE 0.93 01/25/2022   Lab Results  Component Value Date   GFR 77.41 10/14/2022   GFR 63.00 01/25/2022   GFR 71.28 11/15/2021    Plan: Continue all antihypertensives at current dosages.She reports BP usually under better control and documented BP in Jan/March 2024 are well controlled.  Will monitor closely as progresses with weight loss as she works  on [employer], technical sales to promote weight loss and improve BP control.     S/P Gastric Bypass Surgery:  History of remote gastric bypass surgery in [place] about 34 years ago.  Lost 60lbs following surgery and kept off for about 10 years; Then gradual regain since that time.  She is taking Vitamin D OTC supplement, vitamin E supplement, flax oil caps, but not on daily bariatric vitamins. Will plan to check levels at new patient appointment.   Class 1 obesity- BMI 32.1  We reviewed weight, associated conditions and contributing factors.  Patient would benefit from a comprehensive weight loss plan involving nutrition, behavioral strategies, physical activity and medical interventions.  We will develop a reduced-calorie nutritional approach tailored to their REE and nutritional preference.  The former will be determined in a fasting state at her next visit.  We will also assess for cardiometabolic and biomechanical complications and nutritional derangements via serologies.     Obesity Treatment /  Action Plan:  Patient will work on garnering support from family and friends to begin weight loss journey. Will work on eliminating or reducing the presence of highly palatable, calorie dense foods in the home. Will complete provided nutritional and psychosocial assessment questionnaire before the next appointment. Will be scheduled for indirect calorimetry to determine resting energy expenditure in a fasting state.  This will allow [place] to create a reduced calorie, high-protein meal plan to promote loss of fat mass while preserving muscle mass. Counseled on the health benefits of losing 5%-15% of total body weight. Was counseled on pharmacotherapy and role as an adjunct in weight management.   Obesity Education Performed Today:  She was weighed on the bioimpedance scale and results were discussed and documented in the synopsis.  We discussed obesity as a disease and the importance of a more detailed evaluation of all the factors contributing to the disease.  We  discussed the importance of long term lifestyle changes which include nutrition, exercise and behavioral modifications as well as the importance of customizing this to her specific health and social needs.  We discussed the benefits of reaching a healthier weight to alleviate the symptoms of existing conditions and reduce the risks of the biomechanical, metabolic and psychological effects of obesity.  Robin Martinez appears to be in the action stage of change and states they are ready to start intensive lifestyle modifications and behavioral modifications.  44 minutes was spent today on this visit including the above counseling, pre-visit chart review, and post-visit documentation.  Reviewed by clinician on day of visit: allergies, medications, problem list, medical history, surgical history, family history, social history, and previous encounter notes pertinent to obesity diagnosis.   Ilee Randleman,PA-C

## 2023-03-28 ENCOUNTER — Encounter (INDEPENDENT_AMBULATORY_CARE_PROVIDER_SITE_OTHER): Payer: No Typology Code available for payment source | Admitting: Internal Medicine

## 2023-03-28 ENCOUNTER — Encounter (INDEPENDENT_AMBULATORY_CARE_PROVIDER_SITE_OTHER): Payer: Self-pay

## 2023-03-29 NOTE — Progress Notes (Signed)
This encounter was created in error - please disregard.

## 2023-04-03 ENCOUNTER — Ambulatory Visit (INDEPENDENT_AMBULATORY_CARE_PROVIDER_SITE_OTHER): Payer: No Typology Code available for payment source | Admitting: Internal Medicine

## 2023-04-03 ENCOUNTER — Encounter (INDEPENDENT_AMBULATORY_CARE_PROVIDER_SITE_OTHER): Payer: Self-pay | Admitting: Internal Medicine

## 2023-04-03 VITALS — BP 146/84 | HR 73 | Temp 98.1°F | Ht 62.0 in | Wt 175.0 lb

## 2023-04-03 DIAGNOSIS — R0602 Shortness of breath: Secondary | ICD-10-CM | POA: Insufficient documentation

## 2023-04-03 DIAGNOSIS — I10 Essential (primary) hypertension: Secondary | ICD-10-CM | POA: Diagnosis not present

## 2023-04-03 DIAGNOSIS — R5383 Other fatigue: Secondary | ICD-10-CM | POA: Insufficient documentation

## 2023-04-03 DIAGNOSIS — E668 Other obesity: Secondary | ICD-10-CM

## 2023-04-03 DIAGNOSIS — Z9884 Bariatric surgery status: Secondary | ICD-10-CM | POA: Diagnosis not present

## 2023-04-03 DIAGNOSIS — E669 Obesity, unspecified: Secondary | ICD-10-CM | POA: Diagnosis not present

## 2023-04-03 DIAGNOSIS — Z6831 Body mass index (BMI) 31.0-31.9, adult: Secondary | ICD-10-CM | POA: Diagnosis not present

## 2023-04-03 DIAGNOSIS — Z6832 Body mass index (BMI) 32.0-32.9, adult: Secondary | ICD-10-CM | POA: Diagnosis not present

## 2023-04-03 DIAGNOSIS — R7303 Prediabetes: Secondary | ICD-10-CM | POA: Diagnosis not present

## 2023-04-03 DIAGNOSIS — Z1331 Encounter for screening for depression: Secondary | ICD-10-CM | POA: Insufficient documentation

## 2023-04-03 NOTE — Progress Notes (Unsigned)
Chief Complaint:   OBESITY Robin Martinez (MR# [id number]) is a 72 y.o. female who presents for evaluation and treatment of obesity and related comorbidities. Current BMI is Body mass index is 32.01 kg/m. Robin Martinez has been struggling with her weight for many years and has been unsuccessful in either losing weight, maintaining weight loss, or reaching her healthy weight goal.  Robin Martinez is currently in the action stage of change and ready to dedicate time achieving and maintaining a healthier weight. Robin Martinez is interested in becoming our patient and working on intensive lifestyle modifications including (but not limited to) diet and exercise for weight loss.  Robin Martinez's habits were reviewed today and are as follows: Her family eats meals together, she thinks her family will eat healthier with her, her desired weight loss is 35 lbs, she has been heavy most of her life, she started gaining weight in elementary school, her heaviest weight ever was 203 pounds, she is a picky eater and doesn't like to eat healthier foods, she has significant food cravings issues, she snacks frequently in the evenings, she skips meals frequently, she is frequently drinking liquids with calories, she frequently makes poor food choices, she has problems with excessive hunger, and she struggles with emotional eating.  Depression Screen Robin Martinez's Food and Mood (modified PHQ-9) score was 8.  Subjective:   1. Other fatigue Robin Martinez admits to daytime somnolence and  (patient did not answer)  waking up still tired. Patient has a history of symptoms of (patient did not answer). Robin Martinez generally gets  (patient did not answer)  hours of sleep per night, and states that she has (patient did not answer). Snoring (patient did not answer) present. Apneic episodes (patient did not answer) present. Epworth Sleepiness Score is 8.   2. SOB (shortness of breath) on exertion Robin Martinez notes increasing shortness of breath with exercising  and seems to be worsening over time with weight gain. She notes getting out of breath sooner with activity than she used to. This has not gotten worse recently. Robin Martinez denies shortness of breath at rest or orthopnea.  3. History of Roux-en-Y gastric bypass Patient has done quite well 30 years after gastric bypass she has been experiencing some weight regain over the last 20 years.  She is not taking bariatric vitamins and does endorse some difficulty with night vision.   4. Hypertension, essential, benign Not at goal.  Repeat 146/84 I reviewed echocardiogram from 2023 which showed some hypokinesis and stiffening of the heart muscle.  Her EKG today showed an incomplete right bundle branch block and biatrial enlargement.  Patient made aware that she is at risk for heart failure, stroke and chronic kidney disease.  She is currently on triamterene and hydrochlorothiazide.  She has not been monitoring blood pressure at home.    5. Prediabetes Patient aware of disease state and risk of progression. This may contribute to abnormal cravings, fatigue and diabetic complications without having diabetes.   Most recent A1c is  Lab Results  Component Value Date   HGBA1C 5.9 10/14/2022   HGBA1C 5.9 01/08/2013   Assessment/Plan:   1. Other fatigue [name] does feel that her weight is causing her energy to be lower than it should be. Fatigue may be related to obesity, depression or many other causes. Labs will be ordered, and in the meanwhile, Robin Martinez will focus on self care including making healthy food choices, increasing physical activity and focusing on stress reduction.  - EKG 12-Lead  2. SOB (shortness  of breath) on exertion Robin Martinez does feel that she gets out of breath more easily that she used to when she exercises. Robin Martinez's shortness of breath appears to be obesity related and exercise induced. She has agreed to work on weight loss and gradually increase exercise to treat her exercise induced  shortness of breath. Will continue to monitor closely.  3. History of Roux-en-Y gastric bypass We will be checking some of her vitamin levels please refer to order.  We will likely start her on bariatric vitamins once we have an understanding of baseline deficiencies.  Patient also counseled on metabolic adaptations that occur post gastric bypass.  - Vitamin B12 - CBC with Differential/Platelet - Comprehensive metabolic panel - TSH - VITAMIN D 25 Hydroxy (Vit-D Deficiency, Fractures) - Lipid Panel With LDL/HDL Ratio - Parathyroid hormone, intact (no Ca) - Vitamin B1 - Iron, TIBC and Ferritin Panel - Folate RBC - Vitamin A  4. Hypertension, essential, benign I recommend that she check her blood pressure in the morning and also before bedtime and reach out to PCP for treatment intensification.  I have taken the liberty to start her on amlodipine 5 mg once a day until she schedules an appointment with her primary care physician.  5. Prediabetes We reviewed treatment options which includes losing 7 to 10% of body weight, increasing physical activity to a goal of 150 minutes a week at moderate intensity. She may also be a candidate for pharmacoprophylaxis with metformin or incretin mimetic.  - Hemoglobin A1c - Insulin, random  6. Depression screen Robin Martinez had a positive depression screening. Depression is commonly associated with obesity and often results in emotional eating behaviors. We will monitor this closely and work on CBT to help improve the non-hunger eating patterns. Referral to Psychology may be required if no improvement is seen as she continues in our clinic.  7. Class 1 obesity with serious comorbidity and body mass index (BMI) of 31.0 to 31.9 in adult, unspecified obesity type - Vitamin B12 - CBC with Differential/Platelet - Comprehensive metabolic panel - Hemoglobin A1c - Insulin, random - TSH - VITAMIN D 25 Hydroxy (Vit-D Deficiency, Fractures) - Lipid Panel With  LDL/HDL Ratio - Parathyroid hormone, intact (no Ca) - Vitamin B1 - Iron, TIBC and Ferritin Panel - Folate RBC - Vitamin A  Robin Martinez is currently in the action stage of change and her goal is to continue with weight loss efforts. I recommend Robin Martinez begin the structured treatment plan as follows:  She has agreed to the Category 2 Plan.  Exercise goals: Start strengthening exercise.  Behavioral modification strategies: increasing lean protein intake, decreasing simple carbohydrates, increasing vegetables, increasing water intake, decreasing liquid calories, decreasing alcohol intake, decreasing sodium intake, decreasing eating out, no skipping meals, meal planning and cooking strategies, ways to avoid boredom eating, better snacking choices, and planning for success.  She was informed of the importance of frequent follow-up visits to maximize her success with intensive lifestyle modifications for her multiple health conditions. She was informed we would discuss her lab results at her next visit unless there is a critical issue that needs to be addressed sooner. Robin Martinez agreed to keep her next visit at the agreed upon time to discuss these results.  Objective:   Blood pressure (!) 146/84, pulse 73, temperature 98.1 F (36.7 C), height 5\' 2"  (1.575 m), weight 175 lb (79.4 kg), SpO2 100 %. Body mass index is 32.01 kg/m.  EKG: Normal sinus rhythm, rate 70 BPM.  Indirect Calorimeter completed today  shows a VO2 of 139 and a REE of 950.  Her calculated basal metabolic rate is 1610 thus her basal metabolic rate is worse than expected.  General: Cooperative, alert, well developed, in no acute distress. HEENT: Conjunctivae and lids unremarkable. Cardiovascular: Regular rhythm.  Lungs: Normal work of breathing. Neurologic: No focal deficits.   Lab Results  Component Value Date   CREATININE 0.96 04/03/2023   BUN 24 04/03/2023   NA 141 04/03/2023   K 4.1 04/03/2023   CL 101 04/03/2023   CO2  25 04/03/2023   Lab Results  Component Value Date   ALT 13 04/03/2023   AST 16 04/03/2023   ALKPHOS 137 (H) 04/03/2023   BILITOT 0.8 04/03/2023   Lab Results  Component Value Date   HGBA1C 5.8 (H) 04/03/2023   HGBA1C 5.9 10/14/2022   HGBA1C 6.0 11/15/2021   HGBA1C 5.4 06/02/2020   HGBA1C 5.9 06/13/2019   Lab Results  Component Value Date   INSULIN 3.9 04/03/2023   Lab Results  Component Value Date   TSH 2.190 04/03/2023   Lab Results  Component Value Date   CHOL 233 (H) 04/03/2023   HDL 90 04/03/2023   LDLCALC 125 (H) 04/03/2023   TRIG 104 04/03/2023   CHOLHDL 3 11/15/2021   Lab Results  Component Value Date   WBC 4.3 04/03/2023   HGB 14.4 04/03/2023   HCT 42.2 04/03/2023   MCV 85 04/03/2023   PLT 240 04/03/2023   Lab Results  Component Value Date   IRON 63 04/03/2023   TIBC 311 04/03/2023   FERRITIN 115 04/03/2023   Attestation Statements:   Reviewed by clinician on day of visit: allergies, medications, problem list, medical history, surgical history, family history, social history, and previous encounter notes.  Time spent on visit including pre-visit chart review and post-visit charting and care was 50 minutes.   Trude Mcburney, am acting as transcriptionist for Worthy Rancher, MD.  I have reviewed the above documentation for accuracy and completeness, and I agree with the above. -Worthy Rancher, MD

## 2023-04-03 NOTE — Assessment & Plan Note (Signed)
Patient has done quite well 30 years after gastric bypass she has been experiencing some weight regain over the last 20 years.  She is not taking bariatric vitamins and does endorse some difficulty with night vision.  We will be checking some of her vitamin levels please refer to order.  We will likely start her on bariatric vitamins once we have an understanding of baseline deficiencies.  Patient also counseled on metabolic adaptations that occur post gastric bypass.

## 2023-04-03 NOTE — Assessment & Plan Note (Signed)
Most recent A1c is  Lab Results  Component Value Date   HGBA1C 5.9 10/14/2022   HGBA1C 5.9 01/08/2013    Patient aware of disease state and risk of progression. This may contribute to abnormal cravings, fatigue and diabetic complications without having diabetes.   We reviewed treatment options which includes losing 7 to 10% of body weight, increasing physical activity to a goal of 150 minutes a week at moderate intensity. She may also be a candidate for pharmacoprophylaxis with metformin or incretin mimetic.

## 2023-04-03 NOTE — Assessment & Plan Note (Addendum)
Not at goal.  Repeat 146/84 I reviewed echocardiogram from 2023 which showed some hypokinesis and stiffening of the heart muscle.  Her EKG today showed an incomplete right bundle branch block and biatrial enlargement.  Patient made aware that she is at risk for heart failure, stroke and chronic kidney disease.  She is currently on triamterene and hydrochlorothiazide.  She has not been monitoring blood pressure at home.  I recommend that she check her blood pressure in the morning and also before bedtime and reach out to PCP for treatment intensification.  I have taken the liberty to start her on amlodipine 5 mg once a day until she schedules an appointment with her primary care physician.

## 2023-04-04 LAB — COMPREHENSIVE METABOLIC PANEL
Albumin: 4.4 g/dL (ref 3.9–4.9)
BUN: 24 mg/dL (ref 8–27)
CO2: 25 mmol/L (ref 20–29)
Glucose: 87 mg/dL (ref 70–99)
Potassium: 4.1 mmol/L (ref 3.5–5.2)
Total Protein: 7 g/dL (ref 6.0–8.5)

## 2023-04-04 LAB — CBC WITH DIFFERENTIAL/PLATELET
Basophils Absolute: 0 10*3/uL (ref 0.0–0.2)
Basos: 1 %
EOS (ABSOLUTE): 0.1 10*3/uL (ref 0.0–0.4)
Eos: 2 %
Hematocrit: 42.2 % (ref 34.0–46.6)
Hemoglobin: 14.4 g/dL (ref 11.1–15.9)
Immature Granulocytes: 0 %
Lymphs: 27 %
MCH: 29.1 pg (ref 26.6–33.0)
MCV: 85 fL (ref 79–97)
Neutrophils: 64 %
RBC: 4.95 x10E6/uL (ref 3.77–5.28)
RDW: 12.5 % (ref 11.7–15.4)

## 2023-04-04 LAB — IRON,TIBC AND FERRITIN PANEL
Iron Saturation: 20 % (ref 15–55)
Iron: 63 ug/dL (ref 27–139)

## 2023-04-04 LAB — LIPID PANEL WITH LDL/HDL RATIO
Cholesterol, Total: 233 mg/dL — ABNORMAL HIGH (ref 100–199)
LDL Chol Calc (NIH): 125 mg/dL — ABNORMAL HIGH (ref 0–99)
Triglycerides: 104 mg/dL (ref 0–149)

## 2023-04-04 LAB — HEMOGLOBIN A1C: Est. average glucose Bld gHb Est-mCnc: 120 mg/dL

## 2023-04-04 LAB — FOLATE RBC: Folate, Hemolysate: 347 ng/mL

## 2023-04-06 LAB — COMPREHENSIVE METABOLIC PANEL
ALT: 13 IU/L (ref 0–32)
AST: 16 IU/L (ref 0–40)
Alkaline Phosphatase: 137 IU/L — ABNORMAL HIGH (ref 44–121)
BUN/Creatinine Ratio: 25 (ref 12–28)
Bilirubin Total: 0.8 mg/dL (ref 0.0–1.2)
Calcium: 9.8 mg/dL (ref 8.7–10.3)
Chloride: 101 mmol/L (ref 96–106)
Creatinine, Ser: 0.96 mg/dL (ref 0.57–1.00)
Globulin, Total: 2.6 g/dL (ref 1.5–4.5)
Sodium: 141 mmol/L (ref 134–144)
eGFR: 64 mL/min/{1.73_m2} (ref >59)

## 2023-04-06 LAB — CBC WITH DIFFERENTIAL/PLATELET
Immature Grans (Abs): 0 10*3/uL (ref 0.0–0.1)
Lymphocytes Absolute: 1.2 10*3/uL (ref 0.7–3.1)
MCHC: 34.1 g/dL (ref 31.5–35.7)
Monocytes Absolute: 0.3 10*3/uL (ref 0.1–0.9)
Monocytes: 6 %
Neutrophils Absolute: 2.8 10*3/uL (ref 1.4–7.0)
Platelets: 240 10*3/uL (ref 150–450)
WBC: 4.3 10*3/uL (ref 3.4–10.8)

## 2023-04-06 LAB — IRON,TIBC AND FERRITIN PANEL
Ferritin: 115 ng/mL (ref 15–150)
Total Iron Binding Capacity: 311 ug/dL (ref 250–450)
UIBC: 248 ug/dL (ref 118–369)

## 2023-04-06 LAB — LIPID PANEL WITH LDL/HDL RATIO
HDL: 90 mg/dL (ref >39)
LDL/HDL Ratio: 1.4 ratio (ref 0.0–3.2)
VLDL Cholesterol Cal: 18 mg/dL (ref 5–40)

## 2023-04-06 LAB — VITAMIN B12: Vitamin B-12: >2000 pg/mL — ABNORMAL HIGH (ref 232–1245)

## 2023-04-06 LAB — VITAMIN D 25 HYDROXY (VIT D DEFICIENCY, FRACTURES): Vit D, 25-Hydroxy: 64.3 ng/mL (ref 30.0–100.0)

## 2023-04-06 LAB — INSULIN, RANDOM: INSULIN: 3.9 u[IU]/mL (ref 2.6–24.9)

## 2023-04-06 LAB — TSH: TSH: 2.19 u[IU]/mL (ref 0.450–4.500)

## 2023-04-06 LAB — FOLATE RBC: Folate, RBC: 822 ng/mL (ref >498)

## 2023-04-06 LAB — PARATHYROID HORMONE, INTACT (NO CA): PTH: 36 pg/mL (ref 15–65)

## 2023-04-06 LAB — HEMOGLOBIN A1C: Hgb A1c MFr Bld: 5.8 % — ABNORMAL HIGH (ref 4.8–5.6)

## 2023-04-06 LAB — VITAMIN B1: Thiamine: 93.1 nmol/L (ref 66.5–200.0)

## 2023-04-11 ENCOUNTER — Encounter (INDEPENDENT_AMBULATORY_CARE_PROVIDER_SITE_OTHER): Payer: Self-pay | Admitting: Internal Medicine

## 2023-04-11 ENCOUNTER — Ambulatory Visit (INDEPENDENT_AMBULATORY_CARE_PROVIDER_SITE_OTHER): Payer: No Typology Code available for payment source | Admitting: Internal Medicine

## 2023-04-11 VITALS — BP 131/82 | HR 72 | Temp 98.0°F | Ht 62.0 in | Wt 171.0 lb

## 2023-04-11 DIAGNOSIS — Z9884 Bariatric surgery status: Secondary | ICD-10-CM

## 2023-04-11 DIAGNOSIS — R748 Abnormal levels of other serum enzymes: Secondary | ICD-10-CM | POA: Diagnosis not present

## 2023-04-11 DIAGNOSIS — E668 Other obesity: Secondary | ICD-10-CM

## 2023-04-11 DIAGNOSIS — R7303 Prediabetes: Secondary | ICD-10-CM | POA: Diagnosis not present

## 2023-04-11 DIAGNOSIS — E7800 Pure hypercholesterolemia, unspecified: Secondary | ICD-10-CM | POA: Diagnosis not present

## 2023-04-11 DIAGNOSIS — E669 Obesity, unspecified: Secondary | ICD-10-CM

## 2023-04-11 DIAGNOSIS — I10 Essential (primary) hypertension: Secondary | ICD-10-CM | POA: Diagnosis not present

## 2023-04-11 DIAGNOSIS — Z6831 Body mass index (BMI) 31.0-31.9, adult: Secondary | ICD-10-CM | POA: Diagnosis not present

## 2023-04-11 NOTE — Assessment & Plan Note (Signed)
I reviewed vitamin levels and she does not have any serological evidence of nutritional deficiencies.  She has been taking multiple vitamins simultaneously difficult to determine actual amounts.  I did recommend that she switch to a bariatric vitamin which would give her the necessary micronutrients important for gastric bypass patients.  She will purchase bariatric choice from [employer].

## 2023-04-11 NOTE — Progress Notes (Signed)
Office: [phone]  /  Fax: [phone]  WEIGHT SUMMARY AND BIOMETRICS  Vitals Temp: 98 F (36.7 C) BP: 131/82 Pulse Rate: 72 SpO2: 99 %   Anthropometric Measurements Height: 5\' 2"  (1.575 m) Weight: 171 lb (77.6 kg) BMI (Calculated): 31.27 Weight at Last Visit: 175LB Weight Lost Since Last Visit: 4lb Weight Gained Since Last Visit: 0 Starting Weight: 175LB Total Weight Loss (lbs): 4 lb (1.814 kg) Waist Measurement : 30 inches   Body Composition  Body Fat %: 36.5 % Fat Mass (lbs): 62.8 lbs Muscle Mass (lbs): 103.4 lbs Total Body Water (lbs): 67.4 lbs Visceral Fat Rating : 11    No data recorded Today's Visit #: 2  Starting Date: 04/03/23   HPI  Chief Complaint: OBESITY  Robin Martinez is here to discuss her progress with her obesity treatment plan. She is on the the Category 2 Plan and states she is following her eating plan approximately 90 % of the time. She states she is exercising at he gym 12-30 minutes 3 times per week.  Interval History:  Since last office visit she has lost 4 lbs. She reports good adherence to reduced calorie nutritional plan. She has been working on not skipping meals, increasing protein intake at every meal, eating more vegetables, and drinking more water  Orixegenic Control: Denies problems with appetite and hunger signals.  Denies problems with satiety and satiation.  Denies problems with eating patterns and portion control.  Denies abnormal cravings. Denies feeling deprived or restricted.   Barriers identified: none.   Pharmacotherapy for weight loss: She is currently taking no anti-obesity medication.    ASSESSMENT AND PLAN  TREATMENT PLAN FOR OBESITY:  Recommended Dietary Goals  Khiley is currently in the action stage of change. As such, her goal is to continue weight management plan. She has agreed to: continue current plan  Behavioral Intervention  We discussed the following Behavioral Modification Strategies  today: increasing lean protein intake, decreasing simple carbohydrates , increasing vegetables, increasing lower glycemic fruits, increasing fiber rich foods, increasing water intake, work on meal planning and preparation, continue to practice mindfulness when eating, and planning for success.  Additional resources provided today:  Handout on exercise goals and weight gain after gastric bypass  Recommended Physical Activity Goals  Delesia has been advised to work up to 150 minutes of moderate intensity aerobic activity a week and strengthening exercises 2-3 times per week for cardiovascular health, weight loss maintenance and preservation of muscle mass.   She has agreed to :  Think about ways to increase daily physical activity and overcoming barriers to exercise and Increase physical activity in their day and reduce sedentary time (increase NEAT).  Pharmacotherapy We discussed various medication options to help Aneisa with her weight loss efforts and we both agreed to : continue with nutritional and behavioral strategies  ASSOCIATED CONDITIONS ADDRESSED TODAY  Pure hypercholesterolemia Assessment & Plan: LDL is not at goal. Elevated LDL may be secondary to nutrition, genetics and spillover effect from excess adiposity. Recommended LDL goal is <70 to reduce the risk of fatty streaks and the progression to obstructive ASCVD in the future. Her 10 year risk is: The 10-year ASCVD risk score (Arnett DK, et al., 2019) is: 14.9%  Lab Results  Component Value Date   CHOL 233 (H) 04/03/2023   HDL 90 04/03/2023   LDLCALC 125 (H) 04/03/2023   TRIG 104 04/03/2023   CHOLHDL 3 11/15/2021   I counseled her on cardiovascular risk.  I also reviewed home  blood pressure monitoring her blood pressures are above goal.  Dr. [place] has also talked to her about her cardiovascular risk she would like to her risk with lifestyle changes but I still recommend medical therapy.  She may have a genetic basis for her  cholesterol.  Continue weight loss therapy, losing 10% or more of body weight may improve condition. Also advised to reduce saturated fats in diet to less than 10% of daily calories.        Prediabetes Assessment & Plan: Most recent A1c is  Lab Results  Component Value Date   HGBA1C 5.8 (H) 04/03/2023   HGBA1C 5.9 01/08/2013    Patient aware of disease state and risk of progression. This may contribute to abnormal cravings, fatigue and diabetic complications without having diabetes.  Treatment options include losing 7 to 10% of body weight, increasing physical activity to a goal of 150 minutes a week at moderate intensity.  She may also be a candidate for pharmacoprophylaxis with metformin or incretin mimetic.     History of Roux-en-Y gastric bypass Assessment & Plan: I reviewed vitamin levels and she does not have any serological evidence of nutritional deficiencies.  She has been taking multiple vitamins simultaneously difficult to determine actual amounts.  I did recommend that she switch to a bariatric vitamin which would give her the necessary micronutrients important for gastric bypass patients.  She will purchase bariatric choice from [employer].   Hypertension, essential, benign Assessment & Plan: Not at goal although improved today.  I reviewed home blood pressure monitoring results and her blood pressures are above 130/80.  She did not check them before bedtime which I recommend she do.  I reviewed echocardiogram from 2023 which showed some hypokinesis and stiffening of the heart muscle.  Her EKG today showed an incomplete right bundle branch block and biatrial enlargement.  Patient made aware that she is at risk for heart failure, stroke and chronic kidney disease.  She is currently on triamterene and hydrochlorothiazide without adverse effects.  Most recent renal parameters show a decrease in GFR in the 60s and no electrolyte derangements.  She will continue to check her blood  pressure at specified times and will present log to her primary care to discuss treatment intensification to lower her cardiovascular risk.  Losing 10% of body weight may improve blood pressure control   Class 1 obesity with serious comorbidity and body mass index (BMI) of 31.0 to 31.9 in adult, unspecified obesity type  Elevated alkaline phosphatase level Assessment & Plan: Mild, normal PTH and vitamin D levels.  Consider repeating in a fasting state and checking a GGT if GGT elevated that she may benefit from imaging of the right upper quadrant.  She is currently asymptomatic.  No history of cholecystectomy or gallbladder problems which are somewhat common post gastric bypass.     PHYSICAL EXAM:  Blood pressure 131/82, pulse 72, temperature 98 F (36.7 C), height 5\' 2"  (1.575 m), weight 171 lb (77.6 kg), SpO2 99 %. Body mass index is 31.28 kg/m.  General: She is overweight, cooperative, alert, well developed, and in no acute distress. PSYCH: Has normal mood, affect and thought process.   HEENT: EOMI, sclerae are anicteric. Lungs: Normal breathing effort, no conversational dyspnea. Extremities: No edema.  Neurologic: No gross sensory or motor deficits. No tremors or fasciculations noted.    DIAGNOSTIC DATA REVIEWED:  BMET    Component Value Date/Time   NA 141 04/03/2023 1014   K 4.1 04/03/2023 1014  CL 101 04/03/2023 1014   CO2 25 04/03/2023 1014   GLUCOSE 87 04/03/2023 1014   GLUCOSE 95 10/14/2022 1424   BUN 24 04/03/2023 1014   CREATININE 0.96 04/03/2023 1014   CREATININE 0.90 06/02/2020 1024   CALCIUM 9.8 04/03/2023 1014   Lab Results  Component Value Date   HGBA1C 5.8 (H) 04/03/2023   HGBA1C 5.9 01/08/2013   Lab Results  Component Value Date   INSULIN 3.9 04/03/2023   Lab Results  Component Value Date   TSH 2.190 04/03/2023   CBC    Component Value Date/Time   WBC 4.3 04/03/2023 1014   WBC 4.4 01/25/2022 1032   RBC 4.95 04/03/2023 1014   RBC 4.84  01/25/2022 1032   HGB 14.4 04/03/2023 1014   HCT 42.2 04/03/2023 1014   PLT 240 04/03/2023 1014   MCV 85 04/03/2023 1014   MCH 29.1 04/03/2023 1014   MCHC 34.1 04/03/2023 1014   MCHC 33.2 01/25/2022 1032   RDW 12.5 04/03/2023 1014   Iron Studies    Component Value Date/Time   IRON 63 04/03/2023 1014   TIBC 311 04/03/2023 1014   FERRITIN 115 04/03/2023 1014   IRONPCTSAT 20 04/03/2023 1014   Lipid Panel     Component Value Date/Time   CHOL 233 (H) 04/03/2023 1014   TRIG 104 04/03/2023 1014   HDL 90 04/03/2023 1014   CHOLHDL 3 11/15/2021 0930   VLDL 16.4 11/15/2021 0930   LDLCALC 125 (H) 04/03/2023 1014   LDLCALC 130 (H) 06/02/2020 1024   Hepatic Function Panel     Component Value Date/Time   PROT 7.0 04/03/2023 1014   ALBUMIN 4.4 04/03/2023 1014   AST 16 04/03/2023 1014   ALT 13 04/03/2023 1014   ALKPHOS 137 (H) 04/03/2023 1014   BILITOT 0.8 04/03/2023 1014      Component Value Date/Time   TSH 2.190 04/03/2023 1014   Nutritional Lab Results  Component Value Date   VD25OH 64.3 04/03/2023     Return in about 2 weeks (around 04/25/2023) for For Weight Mangement with Dr. Rikki Spearing.Marland Kitchen She was informed of the importance of frequent follow up visits to maximize her success with intensive lifestyle modifications for her multiple health conditions.   ATTESTASTION STATEMENTS:  Reviewed by clinician on day of visit: allergies, medications, problem list, medical history, surgical history, family history, social history, and previous encounter notes.     Worthy Rancher, MD

## 2023-04-11 NOTE — Assessment & Plan Note (Signed)
Most recent A1c is  Lab Results  Component Value Date   HGBA1C 5.8 (H) 04/03/2023   HGBA1C 5.9 01/08/2013    Patient aware of disease state and risk of progression. This may contribute to abnormal cravings, fatigue and diabetic complications without having diabetes.  Treatment options include losing 7 to 10% of body weight, increasing physical activity to a goal of 150 minutes a week at moderate intensity.  She may also be a candidate for pharmacoprophylaxis with metformin or incretin mimetic.

## 2023-04-11 NOTE — Assessment & Plan Note (Signed)
Mild, normal PTH and vitamin D levels.  Consider repeating in a fasting state and checking a GGT if GGT elevated that she may benefit from imaging of the right upper quadrant.  She is currently asymptomatic.  No history of cholecystectomy or gallbladder problems which are somewhat common post gastric bypass.

## 2023-04-11 NOTE — Assessment & Plan Note (Signed)
Not at goal although improved today.  I reviewed home blood pressure monitoring results and her blood pressures are above 130/80.  She did not check them before bedtime which I recommend she do.  I reviewed echocardiogram from 2023 which showed some hypokinesis and stiffening of the heart muscle.  Her EKG today showed an incomplete right bundle branch block and biatrial enlargement.  Patient made aware that she is at risk for heart failure, stroke and chronic kidney disease.  She is currently on triamterene and hydrochlorothiazide without adverse effects.  Most recent renal parameters show a decrease in GFR in the 60s and no electrolyte derangements.  She will continue to check her blood pressure at specified times and will present log to her primary care to discuss treatment intensification to lower her cardiovascular risk.  Losing 10% of body weight may improve blood pressure control

## 2023-04-11 NOTE — Assessment & Plan Note (Addendum)
LDL is not at goal. Elevated LDL may be secondary to nutrition, genetics and spillover effect from excess adiposity. Recommended LDL goal is <70 to reduce the risk of fatty streaks and the progression to obstructive ASCVD in the future. Her 10 year risk is: The 10-year ASCVD risk score (Arnett DK, et al., 2019) is: 14.9%  Lab Results  Component Value Date   CHOL 233 (H) 04/03/2023   HDL 90 04/03/2023   LDLCALC 125 (H) 04/03/2023   TRIG 104 04/03/2023   CHOLHDL 3 11/15/2021   I counseled her on cardiovascular risk.  I also reviewed home blood pressure monitoring her blood pressures are above goal.  Dr. [place] has also talked to her about her cardiovascular risk she would like to her risk with lifestyle changes but I still recommend medical therapy.  She may have a genetic basis for her cholesterol.  Continue weight loss therapy, losing 10% or more of body weight may improve condition. Also advised to reduce saturated fats in diet to less than 10% of daily calories.

## 2023-05-01 ENCOUNTER — Encounter (INDEPENDENT_AMBULATORY_CARE_PROVIDER_SITE_OTHER): Payer: Self-pay | Admitting: Internal Medicine

## 2023-05-01 ENCOUNTER — Ambulatory Visit (INDEPENDENT_AMBULATORY_CARE_PROVIDER_SITE_OTHER): Payer: No Typology Code available for payment source | Admitting: Internal Medicine

## 2023-05-01 VITALS — BP 138/88 | HR 60 | Temp 97.4°F | Ht 62.0 in | Wt 171.0 lb

## 2023-05-01 DIAGNOSIS — Z6831 Body mass index (BMI) 31.0-31.9, adult: Secondary | ICD-10-CM | POA: Diagnosis not present

## 2023-05-01 DIAGNOSIS — E669 Obesity, unspecified: Secondary | ICD-10-CM

## 2023-05-01 DIAGNOSIS — R948 Abnormal results of function studies of other organs and systems: Secondary | ICD-10-CM | POA: Insufficient documentation

## 2023-05-01 DIAGNOSIS — R7303 Prediabetes: Secondary | ICD-10-CM | POA: Diagnosis not present

## 2023-05-01 DIAGNOSIS — R635 Abnormal weight gain: Secondary | ICD-10-CM | POA: Insufficient documentation

## 2023-05-01 DIAGNOSIS — Z9884 Bariatric surgery status: Secondary | ICD-10-CM | POA: Diagnosis not present

## 2023-05-01 NOTE — Assessment & Plan Note (Signed)
I reviewed vitamin levels and she does not have any serological evidence of nutritional deficiencies.  She is not taking a multivitamin.  I reviewed metabolic adaptation changes that occur post Roux-en-Y.  She is no longer feeling the restrictive nature of surgery she may have gastric outlet opening and may be a candidate for TORE procedure.  We will obtain an upper GI series to have a better assessment of her anatomy.

## 2023-05-01 NOTE — Progress Notes (Unsigned)
HPI: [name] is a 72 y.o. female, who is here today to follow on recent lab work. She was last seen on 12/09/2022, since her last visit she has been following with Dr. Rikki Martinez at [facility],had blood work done recently.  She reports that it has been determined that her metabolism is significantly lower than expected and expresses frustration with her inability to lose weight despite her efforts, including regular exercise, at least 3 times per week, has not been as consistent as she was before starting working, about 2 years ago.  Status post Roux-en-Y bariatric procedure, according to patient, she was told that she may benefit from TORE procedure; However, she is apprehensive about undergoing another procedure, fearing excessive weight loss that could lead to an undesirable physical appearance, including "sagging skin."  Apper GI series has been ordered and based on results recommendations will be made.  Additionally, she discusses her concerns about her vitamin levels, particularly elevated B12 and alkaline phosphatase levels, which were identified in her recent lab tests. She has discontinued B12 supplementation, she was taking 5000 mcg daily. Lab Results  Component Value Date   VITAMINB12 >2000 (H) 04/03/2023   Lab Results  Component Value Date   ALT 13 04/03/2023   AST 16 04/03/2023   ALKPHOS 137 (H) 04/03/2023   BILITOT 0.8 04/03/2023   Negative for abdominal pain, nausea, vomiting, changes in bowel habits, or jaundice.  Hypertension on triamterene-HCTZ 37.5-25 mg daily.  Lab Results  Component Value Date   NA 141 04/03/2023   CL 101 04/03/2023   K 4.1 04/03/2023   CO2 25 04/03/2023   BUN 24 04/03/2023   CREATININE 0.96 04/03/2023   EGFR 64 04/03/2023   CALCIUM 9.8 04/03/2023   ALBUMIN 4.4 04/03/2023   GLUCOSE 87 04/03/2023   Hyperlipidemia: Currently she is not on pharmacologic treatment. Lab Results  Component Value Date   CHOL 233  (H) 04/03/2023   HDL 90 04/03/2023   LDLCALC 125 (H) 04/03/2023   TRIG 104 04/03/2023   CHOLHDL 3 11/15/2021   Review of Systems  Constitutional:  Negative for chills and fever.  HENT:  Negative for sore throat and trouble swallowing.   Respiratory:  Negative for cough, shortness of breath and wheezing.   Cardiovascular:  Negative for chest pain and palpitations.  Endocrine: Negative for cold intolerance and heat intolerance.  Genitourinary:  Negative for decreased urine volume, dysuria and hematuria.  Neurological:  Negative for syncope, weakness and headaches.  See other pertinent positives and negatives in HPI.  Current Outpatient Medications on File Prior to Visit  Medication Sig Dispense Refill   aspirin EC 81 MG tablet Take 81 mg by mouth daily. Swallow whole.     cholecalciferol (VITAMIN D3) 25 MCG (1000 UNIT) tablet Take 1,000 Units by mouth daily.     ELDERBERRY PO Take by mouth.     Flaxseed, Linseed, (FLAXSEED OIL) 1000 MG CAPS Take 1,000 mg by mouth daily.     triamterene-hydrochlorothiazide (MAXZIDE-25) 37.5-25 MG tablet Take 1 tablet by mouth daily. Appointment due 90 tablet 2   vitamin E 180 MG (400 UNITS) capsule Take 400 Units by mouth daily.     No current facility-administered medications on file prior to visit.   Past Medical History:  Diagnosis Date   Allergy    Arthritis    Edema of both lower legs    GERD (gastroesophageal reflux disease)    Hypertension    Joint pain  Multiple food allergies    Prediabetes    Sciatic nerve pain    Allergies  Allergen Reactions   Peanut-Containing Drug Products     Social History   Socioeconomic History   Marital status: Married    Spouse name: Lenzell   Number of children: Not on file   Years of education: Not on file   Highest education level: Not on file  Occupational History   Occupation: retired  Tobacco Use   Smoking status: Never   Smokeless tobacco: Never  Substance and Sexual Activity    Alcohol use: No   Drug use: No   Sexual activity: Not Currently  Other Topics Concern   Not on file  Social History Narrative   Not on file   Social Determinants of Health   Financial Resource Strain: Low Risk  (06/01/2022)   Overall Financial Resource Strain (CARDIA)    Difficulty of Paying Living Expenses: Not hard at all  Food Insecurity: No Food Insecurity (06/01/2022)   Hunger Vital Sign    Worried About Running Out of Food in the Last Year: Never true    Ran Out of Food in the Last Year: Never true  Transportation Needs: No Transportation Needs (06/01/2022)   PRAPARE - Administrator, Civil Service (Medical): No    Lack of Transportation (Non-Medical): No  Physical Activity: Insufficiently Active (06/01/2022)   Exercise Vital Sign    Days of Exercise per Week: 3 days    Minutes of Exercise per Session: 30 min  Stress: No Stress Concern Present (06/01/2022)   [employer] of Occupational Health - Occupational Stress Questionnaire    Feeling of Stress : Not at all  Social Connections: Socially Integrated (06/01/2022)   Social Connection and Isolation Panel [NHANES]    Frequency of Communication with Friends and Family: More than three times a week    Frequency of Social Gatherings with Friends and Family: More than three times a week    Attends Religious Services: More than 4 times per year    Active Member of Clubs or Organizations: Yes    Attends Banker Meetings: More than 4 times per year    Marital Status: Married   Vitals:   05/02/23 0854  BP: 128/80  Pulse: 94  Resp: 16  SpO2: 98%   Wt Readings from Last 3 Encounters:  05/02/23 171 lb (77.6 kg)  05/01/23 171 lb (77.6 kg)  04/11/23 171 lb (77.6 kg)    Body mass index is 31.28 kg/m.  Physical Exam Vitals and nursing note reviewed.  Constitutional:      General: She is not in acute distress.    Appearance: She is well-developed.  HENT:     Head: Normocephalic and atraumatic.   Eyes:     Conjunctiva/sclera: Conjunctivae normal.  Cardiovascular:     Rate and Rhythm: Normal rate and regular rhythm.     Pulses:          Dorsalis pedis pulses are 2+ on the right side and 2+ on the left side.     Heart sounds: No murmur heard. Pulmonary:     Effort: Pulmonary effort is normal. No respiratory distress.     Breath sounds: Normal breath sounds.  Abdominal:     Palpations: Abdomen is soft. There is no hepatomegaly or mass.     Tenderness: There is no abdominal tenderness.  Skin:    General: Skin is warm.     Findings: No erythema or  rash.  Neurological:     General: No focal deficit present.     Mental Status: She is alert and oriented to person, place, and time.     Cranial Nerves: No cranial nerve deficit.     Gait: Gait normal.  Psychiatric:        Mood and Affect: Mood and affect normal.    ASSESSMENT AND PLAN:  Ms. Nakeitha was seen today for results.  Diagnoses and all orders for this visit:  Hyperlipidemia The 10-year ASCVD risk score (Arnett DK, et al., 2019) is: 14.3%   Values used to calculate the score:     Age: 59 years     Sex: Female     Is Non-Hispanic African American: Yes     Diabetic: No     Tobacco smoker: No     Systolic Blood Pressure: 128 mmHg     Is BP treated: Yes     HDL Cholesterol: 90 mg/dL     Total Cholesterol: 233 mg/dL We discussed her 16-XWRU ASCVD risk as well as benefits of statin medication. She agrees with trying rosuvastatin 10 mg 3 times per week. Continue low-fat diet. LFTs to be rechecked in a weeks.  History of Roux-en-Y gastric bypass Continue daily multivitamins. In regard to B12 supplementation, recommend resuming B12 at 2000 mcg once per week. Pending GI series to determine benefits from TORE procedure.  Following with Dr Robin Martinez.  Class 1 obesity with body mass index (BMI) of 31.0 to 31.9 in adult Weight has been otherwise stable.  She would like to lose a few more pounds but does not want to  lose "too much" that causes loose skin. She understands the benefits of achieving a healthier weight. Encouraged to continue following a healthful diet, adequate protein intake, and small bites every 2-3 hours, and regular physical activity. Following at the healthy weight and wellness clinic.  Elevated alkaline phosphatase level We discussed possible etiologies. She has a follow-up appointment in 3 weeks Because today we started rosuvastatin, recommend rechecking LFTs in a weeks. GGT can be added to next blood work.  Hypertension, essential, benign BP adequately controlled. Continue Triamterene-HCTZ 37.5-25 mg daily and low salt diet. Eye exam is current. Continue monitoring BP regularly.  I spent a total of 43 minutes in both face to face and non face to face activities for this visit on the date of this encounter. During this time history was obtained and documented, examination was performed, prior labs reviewed, and assessment/plan discussed.  Return if symptoms worsen or fail to improve, for keep next appointment.  Javoni Lucken G. [place], MD  [facility]. [place] office.

## 2023-05-01 NOTE — Assessment & Plan Note (Signed)
Most recent A1c is  Lab Results  Component Value Date   HGBA1C 5.8 (H) 04/03/2023   HGBA1C 5.9 01/08/2013    Patient aware of disease state and risk of progression. This may contribute to abnormal cravings, fatigue and diabetic complications without having diabetes.  Treatment options include losing 7 to 10% of body weight, increasing physical activity to a goal of 150 minutes a week at moderate intensity.  She may also be a candidate for pharmacoprophylaxis with metformin

## 2023-05-01 NOTE — Assessment & Plan Note (Signed)
Patient has a slower than predicted metabolism. IC 950 vs. calculated 1337.This may contribute to weight gain, chronic fatigue and difficulty losing weight. We reviewed measures to improve metabolism including progressive strengthening exercises, increasing protein intake at every meal and maintaining adequate hydration and sleep.  It is not feasible to recommend calorie reduction or use antiobesity medications under the circumstances.  We need to find ways to improve her metabolism and focus on that.  She had a difficult time accepting this.

## 2023-05-01 NOTE — Progress Notes (Signed)
Office: [phone]  /  Fax: [phone]  WEIGHT SUMMARY AND BIOMETRICS  Vitals Temp: (!) 97.4 F (36.3 C) BP: 138/88 Pulse Rate: 60 SpO2: 99 %   Anthropometric Measurements Height: 5\' 2"  (1.575 m) Weight: 171 lb (77.6 kg) BMI (Calculated): 31.27 Weight at Last Visit: 171 lb Weight Lost Since Last Visit: 0 lb Weight Gained Since Last Visit: 0 lb Starting Weight: 175 lb Total Weight Loss (lbs): 4 lb (1.814 kg)   Body Composition  Body Fat %: 44 % Fat Mass (lbs): 75.2 lbs Muscle Mass (lbs): 91 lbs Total Body Water (lbs): 65.6 lbs Visceral Fat Rating : 13    No data recorded Today's Visit #: 3  Starting Date: 04/03/23   HPI  Chief Complaint: OBESITY  Robin Martinez is here to discuss her progress with her obesity treatment plan. She is on the the Category 2 Plan and states she is following her eating plan approximately 80 % of the time. She states she is exercising 30 minutes 3 times per week.  Interval History:  Since last office visit she has maintained.  She is very frustrated due to perceived effort and lack of weight loss.  Patient used to skip meals and is still may not be getting the right amount of protein.  She is not tracking or journaling calories.  Does not like to eat meat.  She has lost sense of fullness few years ago following Roux-en-Y surgery. She reports has not implemented reduced calorie nutritional plan She has been working on not skipping meals and drinking more water  Orixegenic Control: Reports problems with appetite and hunger signals.  Reports problems with satiety and satiation.  Denies problems with eating patterns and portion control.  Denies abnormal cravings. Denies feeling deprived or restricted.   Barriers identified:  Low volume of physical activity, metabolic adaptations following gastric bypass and slow metabolism .   Pharmacotherapy for weight loss: She is currently taking no anti-obesity medication.    ASSESSMENT AND  PLAN  TREATMENT PLAN FOR OBESITY:  Recommended Dietary Goals  Batoul is currently in the action stage of change. As such, her goal is to continue weight management plan. She has agreed to: continue to work on implementation of reduced calorie nutrition plan (RCNP)  Behavioral Intervention  We discussed the following Behavioral Modification Strategies today: increasing lean protein intake, decreasing simple carbohydrates , increasing vegetables, increasing lower glycemic fruits, increasing fiber rich foods, avoiding skipping meals, increasing water intake, work on meal planning and preparation, planning for success, and patient also counseled extensively today on some other contributors to her weight gain following gastric bypass .  We need to emphasize the importance of strengthening and getting the right amount of protein to enhance her metabolism.  Additional resources provided today: None  Recommended Physical Activity Goals  Geanette has been advised to work up to 150 minutes of moderate intensity aerobic activity a week and strengthening exercises 2-3 times per week for cardiovascular health, weight loss maintenance and preservation of muscle mass.   She has agreed to :  Think about ways to increase daily physical activity and overcoming barriers to exercise and Increase physical activity in their day and reduce sedentary time (increase NEAT).  Pharmacotherapy We discussed various medication options to help Ebbie with her weight loss efforts and we both agreed to :  We reviewed available medications it was explained to her that all antiobesity medications are appetite suppressants which in her situation would not be helpful  ASSOCIATED CONDITIONS ADDRESSED  TODAY  Abnormal metabolism Assessment & Plan: Patient has a slower than predicted metabolism. IC 950 vs. calculated 1337.This may contribute to weight gain, chronic fatigue and difficulty losing weight. We reviewed measures to  improve metabolism including progressive strengthening exercises, increasing protein intake at every meal and maintaining adequate hydration and sleep.  It is not feasible to recommend calorie reduction or use antiobesity medications under the circumstances.  We need to find ways to improve her metabolism and focus on that.  She had a difficult time accepting this.    History of Roux-en-Y gastric bypass Assessment & Plan: I reviewed vitamin levels and she does not have any serological evidence of nutritional deficiencies.  She is not taking a multivitamin.  I reviewed metabolic adaptation changes that occur post Roux-en-Y.  She is no longer feeling the restrictive nature of surgery she may have gastric outlet opening and may be a candidate for TORE procedure.  We will obtain an upper GI series to have a better assessment of her anatomy.  Orders: -     DG UGI W SINGLE CM (SOL OR THIN BA); Future  Prediabetes Assessment & Plan: Most recent A1c is  Lab Results  Component Value Date   HGBA1C 5.8 (H) 04/03/2023   HGBA1C 5.9 01/08/2013    Patient aware of disease state and risk of progression. This may contribute to abnormal cravings, fatigue and diabetic complications without having diabetes.  Treatment options include losing 7 to 10% of body weight, increasing physical activity to a goal of 150 minutes a week at moderate intensity.  She may also be a candidate for pharmacoprophylaxis with metformin     Weight gain Assessment & Plan: We will obtain an upper GI series to evaluate gastric outlet size following Roux-en-Y surgery.  Orders: -     DG UGI W SINGLE CM (SOL OR THIN BA); Future    PHYSICAL EXAM:  Blood pressure 138/88, pulse 60, temperature (!) 97.4 F (36.3 C), height 5\' 2"  (1.575 m), weight 171 lb (77.6 kg), SpO2 99%. Body mass index is 31.28 kg/m.  General: She is overweight, cooperative, alert, well developed, and in no acute distress. PSYCH: Has normal mood, affect  and thought process.   HEENT: EOMI, sclerae are anicteric. Lungs: Normal breathing effort, no conversational dyspnea. Extremities: No edema.  Neurologic: No gross sensory or motor deficits. No tremors or fasciculations noted.    DIAGNOSTIC DATA REVIEWED:  BMET    Component Value Date/Time   NA 141 04/03/2023 1014   K 4.1 04/03/2023 1014   CL 101 04/03/2023 1014   CO2 25 04/03/2023 1014   GLUCOSE 87 04/03/2023 1014   GLUCOSE 95 10/14/2022 1424   BUN 24 04/03/2023 1014   CREATININE 0.96 04/03/2023 1014   CREATININE 0.90 06/02/2020 1024   CALCIUM 9.8 04/03/2023 1014   Lab Results  Component Value Date   HGBA1C 5.8 (H) 04/03/2023   HGBA1C 5.9 01/08/2013   Lab Results  Component Value Date   INSULIN 3.9 04/03/2023   Lab Results  Component Value Date   TSH 2.190 04/03/2023   CBC    Component Value Date/Time   WBC 4.3 04/03/2023 1014   WBC 4.4 01/25/2022 1032   RBC 4.95 04/03/2023 1014   RBC 4.84 01/25/2022 1032   HGB 14.4 04/03/2023 1014   HCT 42.2 04/03/2023 1014   PLT 240 04/03/2023 1014   MCV 85 04/03/2023 1014   MCH 29.1 04/03/2023 1014   MCHC 34.1 04/03/2023 1014   MCHC  33.2 01/25/2022 1032   RDW 12.5 04/03/2023 1014   Iron Studies    Component Value Date/Time   IRON 63 04/03/2023 1014   TIBC 311 04/03/2023 1014   FERRITIN 115 04/03/2023 1014   IRONPCTSAT 20 04/03/2023 1014   Lipid Panel     Component Value Date/Time   CHOL 233 (H) 04/03/2023 1014   TRIG 104 04/03/2023 1014   HDL 90 04/03/2023 1014   CHOLHDL 3 11/15/2021 0930   VLDL 16.4 11/15/2021 0930   LDLCALC 125 (H) 04/03/2023 1014   LDLCALC 130 (H) 06/02/2020 1024   Hepatic Function Panel     Component Value Date/Time   PROT 7.0 04/03/2023 1014   ALBUMIN 4.4 04/03/2023 1014   AST 16 04/03/2023 1014   ALT 13 04/03/2023 1014   ALKPHOS 137 (H) 04/03/2023 1014   BILITOT 0.8 04/03/2023 1014      Component Value Date/Time   TSH 2.190 04/03/2023 1014   Nutritional Lab Results   Component Value Date   VD25OH 64.3 04/03/2023     Return in about 3 weeks (around 05/22/2023) for For Weight Mangement with Dr. Rikki Spearing 40 minutes- complex obesity.. She was informed of the importance of frequent follow up visits to maximize her success with intensive lifestyle modifications for her multiple health conditions.   ATTESTASTION STATEMENTS:  Reviewed by clinician on day of visit: allergies, medications, problem list, medical history, surgical history, family history, social history, and previous encounter notes.     Worthy Rancher, MD

## 2023-05-01 NOTE — Assessment & Plan Note (Signed)
We will obtain an upper GI series to evaluate gastric outlet size following Roux-en-Y surgery.

## 2023-05-02 ENCOUNTER — Ambulatory Visit (INDEPENDENT_AMBULATORY_CARE_PROVIDER_SITE_OTHER): Payer: No Typology Code available for payment source | Admitting: Family Medicine

## 2023-05-02 ENCOUNTER — Encounter: Payer: Self-pay | Admitting: Family Medicine

## 2023-05-02 VITALS — BP 128/80 | HR 94 | Resp 16 | Ht 62.0 in | Wt 171.0 lb

## 2023-05-02 DIAGNOSIS — R748 Abnormal levels of other serum enzymes: Secondary | ICD-10-CM

## 2023-05-02 DIAGNOSIS — E7800 Pure hypercholesterolemia, unspecified: Secondary | ICD-10-CM | POA: Diagnosis not present

## 2023-05-02 DIAGNOSIS — Z6831 Body mass index (BMI) 31.0-31.9, adult: Secondary | ICD-10-CM

## 2023-05-02 DIAGNOSIS — E669 Obesity, unspecified: Secondary | ICD-10-CM | POA: Diagnosis not present

## 2023-05-02 DIAGNOSIS — Z9884 Bariatric surgery status: Secondary | ICD-10-CM

## 2023-05-02 DIAGNOSIS — I10 Essential (primary) hypertension: Secondary | ICD-10-CM

## 2023-05-02 MED ORDER — ROSUVASTATIN CALCIUM 10 MG PO TABS
10.0000 mg | ORAL_TABLET | ORAL | 3 refills | Status: DC
Start: 2023-05-03 — End: 2023-08-08

## 2023-05-02 NOTE — Assessment & Plan Note (Addendum)
Weight has been otherwise stable.  She would like to lose a few more pounds but does not want to lose "too much" that causes loose skin. She understands the benefits of achieving a healthier weight. Encouraged to continue following a healthful diet, adequate protein intake, and small bites every 2-3 hours, and regular physical activity. Following at the healthy weight and wellness clinic.

## 2023-05-02 NOTE — Assessment & Plan Note (Signed)
Continue daily multivitamins. In regard to B12 supplementation, recommend resuming B12 at 2000 mcg once per week. Pending GI series to determine benefits from TORE procedure.  Following with Dr Rikki Spearing.

## 2023-05-02 NOTE — Assessment & Plan Note (Addendum)
The 10-year ASCVD risk score (Arnett DK, et al., 2019) is: 14.3%   Values used to calculate the score:     Age: 72 years     Sex: Female     Is Non-Hispanic African American: Yes     Diabetic: No     Tobacco smoker: No     Systolic Blood Pressure: 128 mmHg     Is BP treated: Yes     HDL Cholesterol: 90 mg/dL     Total Cholesterol: 233 mg/dL We discussed her [id number] ASCVD risk as well as benefits of statin medication. She agrees with trying rosuvastatin 10 mg 3 times per week. Continue low-fat diet. LFTs to be rechecked in a weeks.

## 2023-05-02 NOTE — Assessment & Plan Note (Signed)
BP adequately controlled. Continue Triamterene-HCTZ 37.5-25 mg daily and low salt diet. Eye exam is current. Continue monitoring BP regularly.

## 2023-05-02 NOTE — Patient Instructions (Addendum)
A few things to remember from today's visit:  Pure hypercholesterolemia - Plan: rosuvastatin (CRESTOR) 10 MG tablet  Elevated alkaline phosphatase level  Class 1 obesity without serious comorbidity with body mass index (BMI) of 31.0 to 31.9 in adult, unspecified obesity type  Liver to be re-checked in 8 weeks. Rosuvastatin 10 mg 3 times per week. B12 2000 mcg once per week.  If you need refills for medications you take chronically, please call your pharmacy. Do not use My Chart to request refills or for acute issues that need immediate attention. If you send a my chart message, it may take a few days to be addressed, specially if I am not in the office.  Please be sure medication list is accurate. If a new problem present, please set up appointment sooner than planned today.

## 2023-05-02 NOTE — Assessment & Plan Note (Signed)
We discussed possible etiologies. She has a follow-up appointment in 3 weeks Because today we started rosuvastatin, recommend rechecking LFTs in a weeks. GGT can be added to next blood work.

## 2023-05-10 ENCOUNTER — Encounter (INDEPENDENT_AMBULATORY_CARE_PROVIDER_SITE_OTHER): Payer: Self-pay

## 2023-05-31 ENCOUNTER — Ambulatory Visit (INDEPENDENT_AMBULATORY_CARE_PROVIDER_SITE_OTHER): Payer: No Typology Code available for payment source | Admitting: Internal Medicine

## 2023-05-31 ENCOUNTER — Telehealth: Payer: Self-pay

## 2023-05-31 ENCOUNTER — Telehealth (INDEPENDENT_AMBULATORY_CARE_PROVIDER_SITE_OTHER): Payer: No Typology Code available for payment source | Admitting: Family Medicine

## 2023-05-31 ENCOUNTER — Encounter (INDEPENDENT_AMBULATORY_CARE_PROVIDER_SITE_OTHER): Payer: Self-pay | Admitting: Family Medicine

## 2023-05-31 ENCOUNTER — Encounter (INDEPENDENT_AMBULATORY_CARE_PROVIDER_SITE_OTHER): Payer: Self-pay | Admitting: *Deleted

## 2023-05-31 ENCOUNTER — Encounter (INDEPENDENT_AMBULATORY_CARE_PROVIDER_SITE_OTHER): Payer: Self-pay

## 2023-05-31 DIAGNOSIS — E785 Hyperlipidemia, unspecified: Secondary | ICD-10-CM | POA: Diagnosis not present

## 2023-05-31 DIAGNOSIS — Z9884 Bariatric surgery status: Secondary | ICD-10-CM

## 2023-05-31 DIAGNOSIS — E669 Obesity, unspecified: Secondary | ICD-10-CM | POA: Diagnosis not present

## 2023-05-31 DIAGNOSIS — Z6832 Body mass index (BMI) 32.0-32.9, adult: Secondary | ICD-10-CM

## 2023-05-31 DIAGNOSIS — R635 Abnormal weight gain: Secondary | ICD-10-CM

## 2023-05-31 MED ORDER — WEGOVY 0.25 MG/0.5ML ~~LOC~~ SOAJ
0.2500 mg | SUBCUTANEOUS | 0 refills | Status: DC
Start: 2023-05-31 — End: 2023-06-28

## 2023-05-31 NOTE — Progress Notes (Signed)
TeleHealth Visit:  This visit was completed with telemedicine (audio/video) technology. Robin Martinez has verbally consented to this TeleHealth visit. The patient is located at home, the provider is located at home. The participants in this visit include the listed provider and patient. The visit was conducted today via MyChart video.  OBESITY Robin Martinez is here to discuss her progress with her obesity treatment plan along with follow-up of her obesity related diagnoses.   Today's visit was # 5 Starting weight: 175 lbs Starting date: 04/03/23 Weight at last in office visit: 168 lbs on 05/31/23 Total weight loss: 7 lbs at last in office visit on 05/31/23. Today's reported weight (05/31/23): none reported  Nutrition Plan: the Category 2 plan  Current exercise:  resistance/cardio 30 minutes 3 times per week.   Interim History:  She tends to get bored with cat 2 plan food. She is no longer skipping meals. Notes cravings.  Admits to snacking more than she should. Has coffee with evaporated milk and monk fruit.  Typical day of food: Breakfast: eggs or egg bites from [employer]: 1/2 [place] sandwich with cheese Dinner: spaghetti with [place] meat sauce (had this yesterday) Snacks: protein bar, Greek yogurt  Hunger controlled: moderately controlled. Cravings controlled:  moderately controlled.  She would like to start [facility].  She has [employer] and says that all of her meds are covered through this insurance. Assessment/Plan:  1. History of RNY Gastric Bypass/weight gain Had RNYGB approximately 36 years ago in [place]. Has no portion restriction.  High weight was 203, low weight was 130 pounds. Upper GI series ordered last OV to see is she is a candidate for TORe procedure.  She has not been contacted to schedule the test.  Plan: Reordered upper GI series. She will contact us if she has not heard from [facility] about scheduling within 1 week.  2. Hyperlipidemia LDL  is not at goal.  Most recent lipid profile results: LDL elevated at 125, HDL normal at 90, triglycerides normal at 104. Medication(s): Crestor 10 mg 3 times per week Cardiovascular risk factors: advanced age (older than 75 for men, 28 for women), dyslipidemia, and obesity (BMI >= 30 kg/m2) 10-year ASCVD risk score is 14.3%.  Lab Results  Component Value Date   CHOL 233 (H) 04/03/2023   HDL 90 04/03/2023   LDLCALC 125 (H) 04/03/2023   TRIG 104 04/03/2023   CHOLHDL 3 11/15/2021   CHOLHDL 3.0 06/02/2020   CHOLHDL 3 06/13/2019   Lab Results  Component Value Date   ALT 13 04/03/2023   AST 16 04/03/2023   ALKPHOS 137 (H) 04/03/2023   BILITOT 0.8 04/03/2023   The 10-year ASCVD risk score (Arnett DK, et al., 2019) is: 14.3%   Values used to calculate the score:     Age: 72 years     Sex: Female     Is Non-Hispanic African American: Yes     Diabetic: No     Tobacco smoker: No     Systolic Blood Pressure: 128 mmHg     Is BP treated: Yes     HDL Cholesterol: 90 mg/dL     Total Cholesterol: 233 mg/dL  Plan: Continue Crestor 10 mg 3 times per week. Continue to work on weight loss and exercise.  3. Generalized Obesity: Current BMI 32  Pharmacotherapy Plan Start  Wegovy 0.25 mg SQ weekly Perian denies personal or family history of thyroid cancer, history of pancreatitis, or current cholelithiasis. Robin Martinez was informed of the most common side  effects (nausea, constipation, diarrhea).  Robin Martinez is currently in the action stage of change. As such, her goal is to continue with weight loss efforts.  She has agreed to the Category 2 plan and keeping a food journal with goal of 1000-1100 calories and 75-80 grams of protein daily.  1.  Discussed options for meals to give her some variety. 2.  Advised that she may journal in order to have more freedom with food choices.  Exercise goals:  as is  Behavioral modification strategies: increasing lean protein intake, decreasing simple  carbohydrates , and planning for success.  Robin Martinez has agreed to follow-up with our clinic in 4 weeks.  Orders Placed This Encounter  Procedures   DG UGI W SINGLE CM (SOL OR THIN BA)    There are no discontinued medications.   Meds ordered this encounter  Medications   Semaglutide-Weight Management (WEGOVY) 0.25 MG/0.5ML SOAJ    Sig: Inject 0.25 mg into the skin once a week.    Dispense:  2 mL    Refill:  0    Order Specific Question:   Supervising Provider    Answer:   Glennis Brink [[id number]]      Objective:   VITALS: Per patient if applicable, see vitals. GENERAL: Alert and in no acute distress. CARDIOPULMONARY: No increased WOB. Speaking in clear sentences.  PSYCH: Pleasant and cooperative. Speech normal rate and rhythm. Affect is appropriate. Insight and judgement are appropriate. Attention is focused, linear, and appropriate.  NEURO: Oriented as arrived to appointment on time with no prompting.   Attestation Statements:   Reviewed by clinician on day of visit: allergies, medications, problem list, medical history, surgical history, family history, social history, and previous encounter notes.   This was prepared with the assistance of Engineer, civil (consulting).  Occasional wrong-word or sound-a-like substitutions may have occurred due to the inherent limitations of voice recognition software.

## 2023-05-31 NOTE — Telephone Encounter (Signed)
PA submitted for [facility] through Cover My Meds. Awaiting insurance determination.  Key: [id number]

## 2023-06-01 ENCOUNTER — Telehealth (INDEPENDENT_AMBULATORY_CARE_PROVIDER_SITE_OTHER): Payer: Self-pay

## 2023-06-01 NOTE — Telephone Encounter (Signed)
Hey Robin Martinez, The PA was sent in to [facility] and denied. You may call them and initiate an appeal, we do not do the appeals in our office. If you have any questions please let us know. Selena Batten, CMA

## 2023-06-01 NOTE — Telephone Encounter (Signed)
PA for [facility] denied. Appeal is available for patient to complete.

## 2023-06-06 ENCOUNTER — Ambulatory Visit (INDEPENDENT_AMBULATORY_CARE_PROVIDER_SITE_OTHER): Payer: No Typology Code available for payment source | Admitting: Podiatry

## 2023-06-06 ENCOUNTER — Encounter

## 2023-06-06 ENCOUNTER — Encounter: Payer: Self-pay | Admitting: Family Medicine

## 2023-06-06 ENCOUNTER — Ambulatory Visit (INDEPENDENT_AMBULATORY_CARE_PROVIDER_SITE_OTHER): Payer: No Typology Code available for payment source | Admitting: Family Medicine

## 2023-06-06 VITALS — BP 137/91 | HR 69 | Temp 98.6°F | Ht 62.0 in | Wt 168.0 lb

## 2023-06-06 DIAGNOSIS — Z0000 Encounter for general adult medical examination without abnormal findings: Secondary | ICD-10-CM

## 2023-06-06 DIAGNOSIS — Q828 Other specified congenital malformations of skin: Secondary | ICD-10-CM | POA: Diagnosis not present

## 2023-06-06 NOTE — Patient Instructions (Signed)

## 2023-06-06 NOTE — Patient Instructions (Addendum)
I really enjoyed getting to talk with you today! I am available on Tuesdays and Thursdays for virtual visits if you have any questions or concerns, or if I can be of any further assistance.   CHECKLIST FROM ANNUAL WELLNESS VISIT:  -Follow up (please call to schedule if not scheduled after visit):   -yearly for annual wellness visit with primary care office -schedule 1 month follow up for Blood pressure with Dr. [place]and  Here is a list of your preventive care/health maintenance measures and the plan for each if any are due:  PLAN For any measures below that may be due:   Health Maintenance  Topic Date Due   Zoster Vaccines- Shingrix (1 of 2) Never done   Pneumonia Vaccine 7+ Years old (1 of 1 - PCV) Never done   COVID-19 Vaccine (2 - 2023-24 season) 06/10/2022   INFLUENZA VACCINE  05/11/2023   Colonoscopy  10/13/2023   MAMMOGRAM  02/07/2024   Medicare Annual Wellness (AWV)  06/05/2024   DEXA SCAN  Completed   Hepatitis C Screening  Completed   HPV VACCINES  Aged Out   DTaP/Tdap/Td  Discontinued    -See a dentist at least yearly  -Get your eyes checked and then per your eye specialist's recommendations  -Other issues addressed today:  -please monitor your blood pressure and keep a log, follow up with Dr. Swaziland in 1 month. Bring log and cuff. Blood pressure goal is 120/70. -eat at least 1-2 servings of leafy greens daily -cut sodium down to < 1500mg  daily   -I have included below further information regarding a healthy whole foods based diet, physical activity guidelines for adults, stress management and opportunities for social connections. I hope you find this information useful.   -----------------------------------------------------------------------------------------------------------------------------------------------------------------------------------------------------------------------------------------------------------  NUTRITION: -eat real food: lots of colorful  vegetables (half the plate) and fruits -5-7 servings of vegetables and fruits per day (fresh or steamed is best), exp. 2 servings of vegetables with lunch and dinner and 2 servings of fruit per day. Berries and greens such as kale and collards are great choices.  -consume on a regular basis: whole grains (make sure first ingredient on label contains the word "whole"), fresh fruits, fish, nuts, seeds, healthy oils (such as olive oil, avocado oil, grape seed oil) -may eat small amounts of dairy and lean meat on occasion, but avoid processed meats such as ham, bacon, lunch meat, etc. -drink water -try to avoid fast food and pre-packaged foods, processed meat -most experts advise limiting sodium to < 2300mg  per day, should limit further is any chronic conditions such as high blood pressure, heart disease, diabetes, etc. The American Heart Association advised that < 1500mg  is is ideal -try to avoid foods that contain any ingredients with names you do not recognize  -try to avoid sugar/sweets (except for the natural sugar that occurs in fresh fruit) -try to avoid sweet drinks -try to avoid white rice, white bread, pasta (unless whole grain), white or yellow potatoes  EXERCISE GUIDELINES FOR ADULTS: -if you wish to increase your physical activity, do so gradually and with the approval of your doctor -STOP and seek medical care immediately if you have any chest pain, chest discomfort or trouble breathing when starting or increasing exercise  -move and stretch your body, legs, feet and arms when sitting for long periods -Physical activity guidelines for optimal health in adults: -least 150 minutes per week of aerobic exercise (can talk, but not sing) once approved by your doctor, 20-30 minutes of  sustained activity or two 10 minute episodes of sustained activity every day.  -resistance training at least 2 days per week if approved by your doctor -balance exercises 3+ days per week:   Stand somewhere where  you have something sturdy to hold onto if you lose balance.    1) lift up on toes, start with 5x per day and work up to 20x   2) stand and lift on leg straight out to the side so that foot is a few inches of the floor, start with 5x each side and work up to 20x each side   3) stand on one foot, start with 5 seconds each side and work up to 20 seconds on each side  If you need ideas or help with getting more active:  -Silver sneakers https://tools.silversneakers.com  -Walk with a Doc: http://www.duncan-williams.com/  -try to include resistance (weight lifting/strength building) and balance exercises twice per week: or the following link for ideas: http://castillo-powell.com/  BuyDucts.dk  STRESS MANAGEMENT: -can try meditating, or just sitting quietly with deep breathing while intentionally relaxing all parts of your body for 5 minutes daily -if you need further help with stress, anxiety or depression please follow up with your primary doctor or contact the wonderful folks at [facility]: [phone]  SOCIAL CONNECTIONS: -options in [place] if you wish to engage in more social and exercise related activities:  -Silver sneakers https://tools.silversneakers.com  -Walk with a Doc: http://www.duncan-williams.com/  -Check out the [facility] Active Adults 50+ section on the [address] [employer] (hiking clubs, book clubs, cards and games, chess, exercise classes, aquatic classes and much more) - see the website for details: https://www.[place].gov/departments/parks-recreation/active-adults50  -YouTube has lots of exercise videos for different ages and abilities as well  -[facility] (a variety of indoor and outdoor inperson activities for adults). [phone]. [address]  -Virtual Online Classes (a variety of topics): see seniorplanet.org or call  [phone]  -consider volunteering at a school, hospice center, church, senior center or elsewhere

## 2023-06-06 NOTE — Progress Notes (Unsigned)
Subjective:  Patient ID: Robin Martinez, female    DOB: 05-15-1951,  MRN: [id number]  Anujin Coston presents to clinic today for:  Chief Complaint  Patient presents with   Callouses    B/ l feet with the left side of foot the worse   Patient presents with painful corns on both feet.  She has 2 painful lesions on the right forefoot and 1 painful lesion on the left forefoot.  Patient did note that she needed to voice a complaint of her 30-minute wait to be seen this morning since she was the first patient scheduled.  PCP is [place], Betty G, MD. date last seen was 05/02/2023  Past Medical History:  Diagnosis Date   Allergy    Arthritis    Edema of both lower legs    GERD (gastroesophageal reflux disease)    Hypertension    Joint pain    Multiple food allergies    Prediabetes    Sciatic nerve pain     Past Surgical History:  Procedure Laterality Date   BREAST SURGERY     biopsy   ENDOVENOUS ABLATION SAPHENOUS VEIN W/ LASER Right 12/12/2018   endovenous laser ablation right greater saphenous vein by Fabienne Bruns MD   RIGHT/LEFT HEART CATH AND CORONARY ANGIOGRAPHY N/A 03/10/2022   Procedure: RIGHT/LEFT HEART CATH AND CORONARY ANGIOGRAPHY;  Surgeon: Kathleene Hazel, MD;  Location: [facility] INVASIVE CV LAB;  Service: Cardiovascular;  Laterality: N/A;   stab phlebectomy  Right 09/11/2019   stab phlebectomy > 20 incisions right leg by Fabienne Bruns MD    TEE WITHOUT CARDIOVERSION N/A 03/10/2022   Procedure: TRANSESOPHAGEAL ECHOCARDIOGRAM (TEE);  Surgeon: Maisie Fus, MD;  Location: [facility] ENDOSCOPY;  Service: Cardiovascular;  Laterality: N/A;    Allergies  Allergen Reactions   Peanut-Containing Drug Products    Objective:  There were no vitals filed for this visit.  Esveidy Bon is a pleasant 72 y.o. female in NAD. AAO x 3.  Vascular Examination: Capillary refill time is 3-5 seconds to toes bilateral. Palpable pedal pulses b/l LE. Digital hair present b/l. No pedal edema  b/l. Skin temperature gradient WNL b/l. No varicosities b/l. No cyanosis or clubbing noted b/l.   Dermatological Examination: Pedal skin with normal turgor, texture and tone b/l. No open wounds. No interdigital macerations b/l.   Focal hyperkeratotic lesions present, with pain on palpation, located right submet 3 and 5 and left submet 4 but proximal to the metatarsal head area.     Latest Ref Rng & Units 04/03/2023   10:14 AM 10/14/2022    2:24 PM  Hemoglobin A1C  Hemoglobin-A1c 4.8 - 5.6 % 5.8  5.9    Assessment/Plan: 1. Porokeratosis    The hyperkeratotic lesions were sharply debrided/shaved with sterile #313 blade.  Salinocaine was applied to the lesions after debridement today followed by a Band-Aid.  She can remove this later today.  Recommended provided Derm 40 cream, or any urea 40 cream on [employer], to apply to the calluses nightly.  Apologized to the patient for her weight today.  She was our first patient the day, but for some reason our assistant brought 3 other patients back prior to her, and we are not sure of the reason why other than simply missing her name on the schedule somehow.  Return in about 3 months (around 09/06/2023) for plantar corns.   Clerance Lav, DPM, FACFAS [facility]     [address]  [address], [place] [address]                Office [phone]  Fax [phone]

## 2023-06-06 NOTE — Progress Notes (Signed)
PATIENT CHECK-IN and HEALTH RISK ASSESSMENT QUESTIONNAIRE:  -completed by phone/video for upcoming Medicare Preventive Visit  Pre-Visit Check-in: 1)Vitals (height, wt, BP, etc) - record in vitals section for visit on day of visit Request home vitals (wt, BP, etc.) and enter into vitals, THEN update Vital Signs SmartPhrase below at the top of the HPI. See below.  2)Review and Update Medications, Allergies PMH, Surgeries, Social history in Epic 3)Hospitalizations in the last year with date/reason? No   4)Review and Update Care Team (patient's specialists) in Epic 5) Complete PHQ9 in Epic  6) Complete Fall Screening in Epic 7)Review all Health Maintenance Due and order under PCP if not done.  8)Medicare Wellness Questionnaire: Answer theses question about your habits: Do you drink alcohol? no If yes, how many drinks do you have a day?N/A Have you ever smoked?no  Quit date if applicable? N/A  How many packs a day do/did you smoke? N/A Do you use smokeless tobacco?No Do you use an illicit drugs?no  Do you exercises? Yes IF so, what type and how many days/minutes per week? Walks ,3 x week, weight lifting  Are you sexually active? No Number of partners?N/A Typical breakfast: Eggs, coffee, toast  Typical lunch: yogurt, half of sandwich, water  Typical dinner: meat (chicken) veg,  Typical snacks: nuts, yogurt, fruit   Beverages: water, coffee, tea, lemonade   Answer theses question about you: Can you perform most household chores?yes  Do you find it hard to follow a conversation in a noisy room?Yes Do you often ask people to speak up or repeat themselves?No  Do you feel that you have a problem with memory?Yes  Do you balance your checkbook and or bank acounts?No, Husband does Do you feel safe at home?Yes  Last dentist visit? Feb 2024 Do you need assistance with any of the following: Please note if so:NO   Driving?  Feeding yourself?  Getting from bed to chair?  Getting to the  toilet?  Bathing or showering?  Dressing yourself?  Managing money?  Climbing a flight of stairs  Preparing meals?  Do you have Advanced Directives in place (Living Will, Healthcare Power or Attorney)? YES   Last eye Exam and location? 2 Years ago, [facility] - plans to schedule an appointment   Do you currently use prescribed or non-prescribed narcotic or opioid pain medications?no   Do you have a history or close family history of breast, ovarian, tubal or peritoneal cancer or a family member with BRCA (breast cancer susceptibility 1 and 2) gene mutations? Yes   Request home vitals (wt, BP, etc.) and enter into vitals, THEN update Vital Signs SmartPhrase below at the top of the HPI. See below.   Nurse/Assistant Credentials/time stamp: Stann Ore CMA.   ----------------------------------------------------------------------------------------------------------------------------------------------------------------------------------------------------------------------  Vital Signs: Vital signs are patient reported.   MEDICARE ANNUAL PREVENTIVE VISIT WITH PROVIDER: (Welcome to Medicare, initial annual wellness or annual wellness exam)  Virtual Visit via Video Note  I connected with Charnea Valdiviezo on 06/06/23 by phone and verified that I am speaking with the correct person using two identifiers.  Location patient: home Location provider:work or home office Persons participating in the virtual visit: patient, provider  Concerns and/or follow up today: no concerns, stable. She is trying to lose weight and is a little frustrated as is eating healthy she feels and has been exercising. Reports has gone down clothing sizes and feels more lean but has not seen the big drop in weight she had hoped for and is  a little frustrated about this. On review of chart appears she likely has some insulin resistance from labs.    See HM section in Epic for other details of completed  HM.    ROS: negative for report of fevers, unintentional weight loss, vision changes, vision loss, hearing loss or change, chest pain, sob, hemoptysis, melena, hematochezia, hematuria, falls, bleeding or bruising, thoughts of suicide or self harm, memory loss  Patient-completed extensive health risk assessment - reviewed and discussed with the patient: See Health Risk Assessment completed with patient prior to the visit either above or in recent phone note. This was reviewed in detailed with the patient today and appropriate recommendations, orders and referrals were placed as needed per Summary below and patient instructions.   Review of Medical History: -PMH, PSH, Family History and current specialty and care providers reviewed and updated and listed below   Patient Care Team: [place], Betty G, MD as PCP - General (Family Medicine) Corky Crafts, MD as PCP - Cardiology (Cardiology)   Past Medical History:  Diagnosis Date   Allergy    Arthritis    Edema of both lower legs    GERD (gastroesophageal reflux disease)    Hypertension    Joint pain    Multiple food allergies    Prediabetes    Sciatic nerve pain     Past Surgical History:  Procedure Laterality Date   BREAST SURGERY     biopsy   ENDOVENOUS ABLATION SAPHENOUS VEIN W/ LASER Right 12/12/2018   endovenous laser ablation right greater saphenous vein by Fabienne Bruns MD   RIGHT/LEFT HEART CATH AND CORONARY ANGIOGRAPHY N/A 03/10/2022   Procedure: RIGHT/LEFT HEART CATH AND CORONARY ANGIOGRAPHY;  Surgeon: Kathleene Hazel, MD;  Location: [facility] INVASIVE [facility];  Service: Cardiovascular;  Laterality: N/A;   stab phlebectomy  Right 09/11/2019   stab phlebectomy > 20 incisions right leg by Fabienne Bruns MD    TEE WITHOUT CARDIOVERSION N/A 03/10/2022   Procedure: TRANSESOPHAGEAL ECHOCARDIOGRAM (TEE);  Surgeon: Maisie Fus, MD;  Location: [facility] ENDOSCOPY;  Service: Cardiovascular;  Laterality: N/A;    Social History    Socioeconomic History   Marital status: Married    Spouse name: Lenzell   Number of children: Not on file   Years of education: Not on file   Highest education level: Not on file  Occupational History   Occupation: retired  Tobacco Use   Smoking status: Never   Smokeless tobacco: Never  Substance and Sexual Activity   Alcohol use: No   Drug use: No   Sexual activity: Not Currently  Other Topics Concern   Not on file  Social History Narrative   Not on file   Social Determinants of Health   Financial Resource Strain: Low Risk  (06/01/2022)   Overall Financial Resource Strain (CARDIA)    Difficulty of Paying Living Expenses: Not hard at all  Food Insecurity: No Food Insecurity (06/01/2022)   Hunger Vital Sign    Worried About Running Out of Food in the Last Year: Never true    Ran Out of Food in the Last Year: Never true  Transportation Needs: No Transportation Needs (06/01/2022)   PRAPARE - Administrator, Civil Service (Medical): No    Lack of Transportation (Non-Medical): No  Physical Activity: Insufficiently Active (06/01/2022)   Exercise Vital Sign    Days of Exercise per Week: 3 days    Minutes of Exercise per Session: 30 min  Stress: No Stress  Concern Present (06/01/2022)   [employer] of Occupational Health - Occupational Stress Questionnaire    Feeling of Stress : Not at all  Social Connections: Socially Integrated (06/01/2022)   Social Connection and Isolation Panel [NHANES]    Frequency of Communication with Friends and Family: More than three times a week    Frequency of Social Gatherings with Friends and Family: More than three times a week    Attends Religious Services: More than 4 times per year    Active Member of [employer] or Organizations: Yes    Attends Engineer, structural: More than 4 times per year    Marital Status: Married  Catering manager Violence: Not At Risk (06/01/2022)   Humiliation, Afraid, Rape, and Kick questionnaire     Fear of Current or Ex-Partner: No    Emotionally Abused: No    Physically Abused: No    Sexually Abused: No    Family History  Problem Relation Age of Onset   Hypertension Mother    Dementia Mother    Stroke Father    Hypertension Father    Cancer Maternal Grandmother        gyn ("vaginal")   Diabetes Maternal Grandfather     Current Outpatient Medications on File Prior to Visit  Medication Sig Dispense Refill   aspirin EC 81 MG tablet Take 81 mg by mouth daily. Swallow whole.     cholecalciferol (VITAMIN D3) 25 MCG (1000 UNIT) tablet Take 1,000 Units by mouth daily.     rosuvastatin (CRESTOR) 10 MG tablet Take 1 tablet (10 mg total) by mouth 3 (three) times a week. 36 tablet 3   triamterene-hydrochlorothiazide (MAXZIDE-25) 37.5-25 MG tablet Take 1 tablet by mouth daily. Appointment due 90 tablet 2   vitamin E 180 MG (400 UNITS) capsule Take 400 Units by mouth daily.     Flaxseed, Linseed, (FLAXSEED OIL) 1000 MG CAPS Take 1,000 mg by mouth daily. (Patient not taking: Reported on 06/06/2023)     Semaglutide-Weight Management (WEGOVY) 0.25 MG/0.5ML SOAJ Inject 0.25 mg into the skin once a week. (Patient not taking: Reported on 06/06/2023) 2 mL 0   No current facility-administered medications on file prior to visit.    Allergies  Allergen Reactions   Peanut-Containing Drug Products        Physical Exam Vitals requested from patient and listed below if patient had equipment and was able to obtain at home for this virtual visit: Vitals:   06/06/23 1144  BP: (!) 137/91  Pulse: 69  Temp: 98.6 F (37 C)   Estimated body mass index is 30.73 kg/m as calculated from the following:   Height as of this encounter: 5\' 2"  (1.575 m).   Weight as of this encounter: 168 lb (76.2 kg).  EKG (optional): deferred due to virtual visit  GENERAL: alert, oriented, no acute distress detected, full vision exam deferred due to pandemic and/or virtual encounter  HEENT: atraumatic, conjunttiva  clear, no obvious abnormalities on inspection of external nose and ears  NECK: normal movements of the head and neck  LUNGS: on inspection no signs of respiratory distress, breathing rate appears normal, no obvious gross SOB, gasping or wheezing  CV: no obvious cyanosis  MS: moves all visible extremities without noticeable abnormality  PSYCH/NEURO: pleasant and cooperative, no obvious depression or anxiety, speech and thought processing grossly intact, Cognitive function grossly intact  [employer] Visit from 06/06/2023 in [facility] [place]  PHQ-9 Total Score 1  06/06/2023   11:45 AM 12/09/2022    2:23 PM 10/14/2022    2:01 PM 08/09/2022   12:44 PM 06/01/2022    3:11 PM  Depression screen PHQ 2/9  Decreased Interest 0 0 0 0 0  Down, Depressed, Hopeless 0 0 0 0 0  PHQ - 2 Score 0 0 0 0 0  Altered sleeping 0      Tired, decreased energy 0      Change in appetite 0      Feeling bad or failure about yourself  1      Trouble concentrating 0      Moving slowly or fidgety/restless 0      Suicidal thoughts 0      PHQ-9 Score 1      Difficult doing work/chores Not difficult at all           08/09/2022   12:44 PM 10/14/2022    2:01 PM 12/09/2022    2:23 PM 05/31/2023    4:35 PM 06/06/2023   11:45 AM  Fall Risk  Falls in the past year? 0 0 0 0 0  Was there an injury with Fall? 0 0 0 0 0  Fall Risk Category Calculator 0 0 0 0 0  Fall Risk Category (Retired) Low Low     (RETIRED) Patient Fall Risk Level Low fall risk Low fall risk     Patient at Risk for Falls Due to No Fall Risks No Fall Risks No Fall Risks    Fall risk Follow up Falls evaluation completed Falls evaluation completed Falls evaluation completed  Falls evaluation completed     SUMMARY AND PLAN:  Encounter for Medicare annual wellness exam   Discussed applicable health maintenance/preventive health measures and advised and referred or ordered per patient  preferences: -she declined tetanus vaccines, she prefers to avoid meds and vaccines unless absolutely needed -she declined bone density today -she is considering the covid, flu shots this fall and the shingles and pneumonia vaccines, discussed each and how to get if she decides to do so. -advised healthy whole foods based diet - see patient instructions - this should help with the blood sugar and the elevated BP and advise PCP follow up in 1-2 months  Health Maintenance  Topic Date Due   Zoster Vaccines- Shingrix (1 of 2) Never done   Pneumonia Vaccine 26+ Years old (1 of 1 - PCV) Never done   COVID-19 Vaccine (2 - 2023-24 season) 06/10/2022   INFLUENZA VACCINE  05/11/2023   Colonoscopy  10/13/2023   MAMMOGRAM  02/07/2024   Medicare Annual Wellness (AWV)  06/05/2024   DEXA SCAN  Completed   Hepatitis C Screening  Completed   HPV VACCINES  Aged Out   DTaP/Tdap/Td  Discontinued   Education and counseling on the following was provided based on the above review of health and a plan/checklist for the patient, along with additional information discussed, was provided for the patient in the patient instructions :  -discussed elevated BP, Blood pressure goals. Reports always runs a little high but has been eating more salt and is more elevated from that and sinus med. Agrees to monitor, reduce salt, make lifestyle changes per below and schedule close follow up with PCP (bring cuff and log to appointment.) -Provided safe balance exercises that can be done at home to improve balance and discussed exercise guidelines for adults with include balance exercises at least 3 days per week.  -Advised and counseled on a healthy lifestyle -  including the importance of a healthy diet, regular physical activity, social connections and stress management. -Reviewed patient's current diet. Advised and counseled on a whole foods based healthy diet. A summary of a healthy diet was provided in the Patient Instructions.   -reviewed patient's current physical activity level and discussed exercise guidelines for adults. Discussed community resources and ideas for safe exercise at home to assist in meeting exercise guideline recommendations in a safe and healthy way.  -Advise yearly dental visits at minimum and regular eye exams   Follow up: see patient instructions     Patient Instructions  I really enjoyed getting to talk with you today! I am available on Tuesdays and Thursdays for virtual visits if you have any questions or concerns, or if I can be of any further assistance.   CHECKLIST FROM ANNUAL WELLNESS VISIT:  -Follow up (please call to schedule if not scheduled after visit):   -yearly for annual wellness visit with primary care office -schedule 1 month follow up for Blood pressure with Dr. [place]  Here is a list of your preventive care/health maintenance measures and the plan for each if any are due:  PLAN For any measures below that may be due:   Health Maintenance  Topic Date Due   Zoster Vaccines- Shingrix (1 of 2) Never done   Pneumonia Vaccine 87+ Years old (1 of 1 - PCV) Never done   COVID-19 Vaccine (2 - 2023-24 season) 06/10/2022   INFLUENZA VACCINE  05/11/2023   Colonoscopy  10/13/2023   MAMMOGRAM  02/07/2024   Medicare Annual Wellness (AWV)  06/05/2024   DEXA SCAN  Completed   Hepatitis C Screening  Completed   HPV VACCINES  Aged Out   DTaP/Tdap/Td  Discontinued    -See a dentist at least yearly  -Get your eyes checked and then per your eye specialist's recommendations  -Other issues addressed today:  -please monitor your blood pressure and keep a log, follow up with Dr. [place] in 1 month. Bring log and cuff. Blood pressure goal is 120/70. -eat at least 1-2 servings of leafy greens daily -cut sodium down to < 1500mg  daily   -I have included below further information regarding a healthy whole foods based diet, physical activity guidelines for adults, stress management  and opportunities for social connections. I hope you find this information useful.   -----------------------------------------------------------------------------------------------------------------------------------------------------------------------------------------------------------------------------------------------------------  NUTRITION: -eat real food: lots of colorful vegetables (half the plate) and fruits -5-7 servings of vegetables and fruits per day (fresh or steamed is best), exp. 2 servings of vegetables with lunch and dinner and 2 servings of fruit per day. Berries and greens such as kale and collards are great choices.  -consume on a regular basis: whole grains (make sure first ingredient on label contains the word "whole"), fresh fruits, fish, nuts, seeds, healthy oils (such as olive oil, avocado oil, grape seed oil) -may eat small amounts of dairy and lean meat on occasion, but avoid processed meats such as ham, bacon, lunch meat, etc. -drink water -try to avoid fast food and pre-packaged foods, processed meat -most experts advise limiting sodium to < 2300mg  per day, should limit further is any chronic conditions such as high blood pressure, heart disease, diabetes, etc. The American Heart Association advised that < 1500mg  is is ideal -try to avoid foods that contain any ingredients with names you do not recognize  -try to avoid sugar/sweets (except for the natural sugar that occurs in fresh fruit) -try to avoid sweet drinks -try  to avoid white rice, white bread, pasta (unless whole grain), white or yellow potatoes  EXERCISE GUIDELINES FOR ADULTS: -if you wish to increase your physical activity, do so gradually and with the approval of your doctor -STOP and seek medical care immediately if you have any chest pain, chest discomfort or trouble breathing when starting or increasing exercise  -move and stretch your body, legs, feet and arms when sitting for long periods -Physical  activity guidelines for optimal health in adults: -least 150 minutes per week of aerobic exercise (can talk, but not sing) once approved by your doctor, 20-30 minutes of sustained activity or two 10 minute episodes of sustained activity every day.  -resistance training at least 2 days per week if approved by your doctor -balance exercises 3+ days per week:   Stand somewhere where you have something sturdy to hold onto if you lose balance.    1) lift up on toes, start with 5x per day and work up to 20x   2) stand and lift on leg straight out to the side so that foot is a few inches of the floor, start with 5x each side and work up to 20x each side   3) stand on one foot, start with 5 seconds each side and work up to 20 seconds on each side  If you need ideas or help with getting more active:  -Silver sneakers https://tools.silversneakers.com  -Walk with a Doc: http://www.duncan-williams.com/  -try to include resistance (weight lifting/strength building) and balance exercises twice per week: or the following link for ideas: http://castillo-powell.com/  BuyDucts.dk  STRESS MANAGEMENT: -can try meditating, or just sitting quietly with deep breathing while intentionally relaxing all parts of your body for 5 minutes daily -if you need further help with stress, anxiety or depression please follow up with your primary doctor or contact the wonderful folks at [facility]: [phone]  SOCIAL CONNECTIONS: -options in [place] if you wish to engage in more social and exercise related activities:  -Silver sneakers https://tools.silversneakers.com  -Walk with a Doc: http://www.duncan-williams.com/  -Check out the [facility] Active Adults 50+ section on the [place] of [employer] (hiking clubs, book clubs, cards and games, chess, exercise classes, aquatic classes and much more) - see the website for  details: https://www.[place].gov/departments/parks-recreation/active-adults50  -YouTube has lots of exercise videos for different ages and abilities as well  -[facility] (a variety of indoor and outdoor inperson activities for adults). 8027630365. [address].  -Virtual Online Classes (a variety of topics): see seniorplanet.org or call [phone]  -consider volunteering at a school, hospice center, church, senior center or elsewhere           Terressa Koyanagi, DO

## 2023-06-07 DIAGNOSIS — N182 Chronic kidney disease, stage 2 (mild): Secondary | ICD-10-CM | POA: Diagnosis not present

## 2023-06-07 DIAGNOSIS — R7303 Prediabetes: Secondary | ICD-10-CM | POA: Diagnosis not present

## 2023-06-07 DIAGNOSIS — Z6830 Body mass index (BMI) 30.0-30.9, adult: Secondary | ICD-10-CM | POA: Diagnosis not present

## 2023-06-07 DIAGNOSIS — R2681 Unsteadiness on feet: Secondary | ICD-10-CM | POA: Diagnosis not present

## 2023-06-07 DIAGNOSIS — E785 Hyperlipidemia, unspecified: Secondary | ICD-10-CM | POA: Diagnosis not present

## 2023-06-07 DIAGNOSIS — Z008 Encounter for other general examination: Secondary | ICD-10-CM | POA: Diagnosis not present

## 2023-06-07 DIAGNOSIS — E669 Obesity, unspecified: Secondary | ICD-10-CM | POA: Diagnosis not present

## 2023-06-07 DIAGNOSIS — I5032 Chronic diastolic (congestive) heart failure: Secondary | ICD-10-CM | POA: Diagnosis not present

## 2023-06-07 DIAGNOSIS — I771 Stricture of artery: Secondary | ICD-10-CM | POA: Diagnosis not present

## 2023-06-07 DIAGNOSIS — I130 Hypertensive heart and chronic kidney disease with heart failure and stage 1 through stage 4 chronic kidney disease, or unspecified chronic kidney disease: Secondary | ICD-10-CM | POA: Diagnosis not present

## 2023-06-16 ENCOUNTER — Encounter: Payer: Self-pay | Admitting: Pharmacist

## 2023-06-26 ENCOUNTER — Ambulatory Visit (INDEPENDENT_AMBULATORY_CARE_PROVIDER_SITE_OTHER): Admitting: Family Medicine

## 2023-06-26 ENCOUNTER — Encounter: Payer: Self-pay | Admitting: Family Medicine

## 2023-06-26 VITALS — BP 121/95 | HR 76 | Temp 97.6°F | Resp 16 | Ht 62.0 in | Wt 169.4 lb

## 2023-06-26 DIAGNOSIS — I10 Essential (primary) hypertension: Secondary | ICD-10-CM

## 2023-06-26 MED ORDER — AMLODIPINE BESYLATE 2.5 MG PO TABS
2.5000 mg | ORAL_TABLET | Freq: Every day | ORAL | 1 refills | Status: DC
Start: 2023-06-26 — End: 2023-08-08

## 2023-06-26 NOTE — Patient Instructions (Addendum)
A few things to remember from today's visit:  Hypertension, essential, benign - Plan: amLODipine (NORVASC) 2.5 MG tablet Today we are adding Amlodipine 2.5 mg daily at bedtime. No changes in Triamterene-hydrochlorothiazide. Blood pressure morning and night for 2 weeks, you can do it after you are back from your cruise.  If you need refills for medications you take chronically, please call your pharmacy. Do not use My Chart to request refills or for acute issues that need immediate attention. If you send a my chart message, it may take a few days to be addressed, specially if I am not in the office.  Please be sure medication list is accurate. If a new problem present, please set up appointment sooner than planned today.

## 2023-06-26 NOTE — Progress Notes (Signed)
Ms. Robin Martinez is a 72 y.o.female, with past medical history significant for nonrheumatic mitral valve regurgitation, hypertension, GERD, IFG, and hyperlipidemia here today to follow on HTN.  Last follow up visit: 05/02/2023.  She is concerned about elevated DBP's at home and during OV's. For hypertension she is currently on triamterene-hctz 37.5-25 mg daily.  BP readings at home: She brought in her BP log, which shows systolic readings ranging from 120s-150s. She notes that her higher systolic readings of 140s-150s tend to occur when she has not taken her antihypertensives yet that day. Tends to check her BP around 8-10 am.  Side effects: None.  Has occasional episodes of palpitations, "every now and then" for a while. Takes aspirin during episodes of palpitations, when going to bed at night, no associated symptoms. Last appt with cardiologist in 07/2022, Dr. Eldridge Dace  She maintains a low sodium diet. Is not losing weight, but notes her clothes have been fitting better.  She follows with healthy weight and wellness clinic regularly. Negative for unusual or severe headache, visual changes, exertional chest pain, dyspnea,  focal weakness, or edema.  Lab Results  Component Value Date   CREATININE 0.96 04/03/2023   BUN 24 04/03/2023   NA 141 04/03/2023   K 4.1 04/03/2023   CL 101 04/03/2023   CO2 25 04/03/2023   Review of Systems  Constitutional:  Negative for activity change, appetite change, chills and fever.  HENT:  Negative for nosebleeds and sore throat.   Respiratory:  Negative for cough and wheezing.   Cardiovascular:  Positive for palpitations (occasional).  Gastrointestinal:  Negative for abdominal pain, nausea and vomiting.  Genitourinary:  Negative for decreased urine volume, dysuria and hematuria.  Skin:  Negative for rash.  Neurological:  Negative for syncope and facial asymmetry.  Psychiatric/Behavioral:  Negative for confusion. The patient is nervous/anxious.    See other pertinent positives and negatives in HPI.  Current Outpatient Medications on File Prior to Visit  Medication Sig Dispense Refill   aspirin EC 81 MG tablet Take 81 mg by mouth daily. Swallow whole.     cholecalciferol (VITAMIN D3) 25 MCG (1000 UNIT) tablet Take 1,000 Units by mouth daily.     rosuvastatin (CRESTOR) 10 MG tablet Take 1 tablet (10 mg total) by mouth 3 (three) times a week. 36 tablet 3   triamterene-hydrochlorothiazide (MAXZIDE-25) 37.5-25 MG tablet Take 1 tablet by mouth daily. Appointment due 90 tablet 2   vitamin E 180 MG (400 UNITS) capsule Take 400 Units by mouth daily.     Flaxseed, Linseed, (FLAXSEED OIL) 1000 MG CAPS Take 1,000 mg by mouth daily. (Patient not taking: Reported on 06/06/2023)     Semaglutide-Weight Management (WEGOVY) 0.25 MG/0.5ML SOAJ Inject 0.25 mg into the skin once a week. (Patient not taking: Reported on 06/06/2023) 2 mL 0   No current facility-administered medications on file prior to visit.   Past Medical History:  Diagnosis Date   Allergy    Arthritis    Edema of both lower legs    GERD (gastroesophageal reflux disease)    Hypertension    Joint pain    Multiple food allergies    Prediabetes    Sciatic nerve pain     Allergies  Allergen Reactions   Peanut-Containing Drug Products     Social History   Socioeconomic History   Marital status: Married    Spouse name: Lenzell   Number of children: Not on file   Years of education: Not on file  Highest education level: Not on file  Occupational History   Occupation: retired  Tobacco Use   Smoking status: Never   Smokeless tobacco: Never  Substance and Sexual Activity   Alcohol use: No   Drug use: No   Sexual activity: Not Currently  Other Topics Concern   Not on file  Social History Narrative   Not on file   Social Determinants of Health   Financial Resource Strain: Low Risk  (06/01/2022)   Overall Financial Resource Strain (CARDIA)    Difficulty of Paying  Living Expenses: Not hard at all  Food Insecurity: No Food Insecurity (06/01/2022)   Hunger Vital Sign    Worried About Running Out of Food in the Last Year: Never true    Ran Out of Food in the Last Year: Never true  Transportation Needs: No Transportation Needs (06/01/2022)   PRAPARE - Administrator, Civil Service (Medical): No    Lack of Transportation (Non-Medical): No  Physical Activity: Insufficiently Active (06/01/2022)   Exercise Vital Sign    Days of Exercise per Week: 3 days    Minutes of Exercise per Session: 30 min  Stress: No Stress Concern Present (06/01/2022)   [employer] of Occupational Health - Occupational Stress Questionnaire    Feeling of Stress : Not at all  Social Connections: Socially Integrated (06/01/2022)   Social Connection and Isolation Panel [NHANES]    Frequency of Communication with Friends and Family: More than three times a week    Frequency of Social Gatherings with Friends and Family: More than three times a week    Attends Religious Services: More than 4 times per year    Active Member of Clubs or Organizations: Yes    Attends Banker Meetings: More than 4 times per year    Marital Status: Married    Vitals:   06/26/23 1156 06/26/23 1242  BP: (!) 128/92 (!) 121/95  Pulse: 76   Resp: 16   Temp: 97.6 F (36.4 C)   SpO2: 98%    Body mass index is 30.98 kg/m.  Physical Exam Vitals and nursing note reviewed.  Constitutional:      General: She is not in acute distress.    Appearance: She is well-developed.  HENT:     Head: Normocephalic and atraumatic.     Mouth/Throat:     Mouth: Mucous membranes are moist.     Pharynx: Oropharynx is clear.  Eyes:     Conjunctiva/sclera: Conjunctivae normal.  Cardiovascular:     Rate and Rhythm: Normal rate and regular rhythm.     Pulses:          Dorsalis pedis pulses are 2+ on the right side and 2+ on the left side.     Heart sounds: No murmur heard. Pulmonary:      Effort: Pulmonary effort is normal. No respiratory distress.     Breath sounds: Normal breath sounds.  Abdominal:     Palpations: Abdomen is soft. There is no mass.     Tenderness: There is no abdominal tenderness.  Lymphadenopathy:     Cervical: No cervical adenopathy.  Skin:    General: Skin is warm.     Findings: No erythema or rash.  Neurological:     General: No focal deficit present.     Mental Status: She is alert and oriented to person, place, and time.     Cranial Nerves: No cranial nerve deficit.     Gait: Gait normal.  Psychiatric:        Mood and Affect: Mood and affect normal.   ASSESSMENT AND PLAN:  Ms. Lemery was seen today for follow HTN follow up.   Hypertension, essential, benign Assessment & Plan: Most of home SBP's are at goal, a couple elevated 140-150 when she checked BP before taking her medication, which she takes in the morning but not around the same time. Continue triamterene-hydrochlorothiazide 37.5-25 mg daily. She agrees with adding low-dose amlodipine, 2.5 mg at bedtime. Continue low-salt/DASH diet. Recommend monitoring BP a.m. and p.m. and to let me know BP readings in about 2 weeks. Follow-up in 4 months, before if needed.  Orders: -     amLODIPine Besylate; Take 1 tablet (2.5 mg total) by mouth daily.  Dispense: 30 tablet; Refill: 1   Return in about 4 months (around 10/26/2023) for chronic problems.  I,Rachel Rivera,acting as a scribe for Betty Swazil[place], MD.,have documented all relevant documentation on the behalf of Betty [place], MD,as directed by  Betty Sw[place]il[place], MD while in the presence of Betty [place], MD.  I, Betty [place], MD, have reviewed all documentation for this visit. The documentation on 06/26/23 for the exam, diagnosis, procedures, and orders are all accurate and complete.  Betty G. Sw[place], MD  [facility]. [place] office.

## 2023-06-26 NOTE — Assessment & Plan Note (Addendum)
Most of home SBP's are at goal, a couple elevated 140-150 when she checked BP before taking her medication, which she takes in the morning but not around the same time. Continue triamterene-hydrochlorothiazide 37.5-25 mg daily. She agrees with adding low-dose amlodipine, 2.5 mg at bedtime. Continue low-salt/DASH diet. Recommend monitoring BP a.m. and p.m. and to let me know BP readings in about 2 weeks. Follow-up in 4 months, before if needed.

## 2023-06-28 ENCOUNTER — Ambulatory Visit (INDEPENDENT_AMBULATORY_CARE_PROVIDER_SITE_OTHER): Payer: No Typology Code available for payment source | Admitting: Physician Assistant

## 2023-06-28 ENCOUNTER — Encounter (INDEPENDENT_AMBULATORY_CARE_PROVIDER_SITE_OTHER): Payer: Self-pay | Admitting: Physician Assistant

## 2023-06-28 VITALS — BP 133/77 | HR 75 | Temp 98.3°F | Ht 62.0 in | Wt 164.0 lb

## 2023-06-28 DIAGNOSIS — R7303 Prediabetes: Secondary | ICD-10-CM

## 2023-06-28 DIAGNOSIS — Z9884 Bariatric surgery status: Secondary | ICD-10-CM | POA: Diagnosis not present

## 2023-06-28 DIAGNOSIS — E669 Obesity, unspecified: Secondary | ICD-10-CM

## 2023-06-28 DIAGNOSIS — I10 Essential (primary) hypertension: Secondary | ICD-10-CM

## 2023-06-28 DIAGNOSIS — Z6830 Body mass index (BMI) 30.0-30.9, adult: Secondary | ICD-10-CM

## 2023-06-28 NOTE — Progress Notes (Signed)
.[facility]  Office: [phone]  /  Fax: [phone]  WEIGHT SUMMARY AND BIOMETRICS  Vitals Temp: 98.3 F (36.8 C) BP: 133/77 Pulse Rate: 75 SpO2: 99 %   Anthropometric Measurements Height: 5\' 2"  (1.575 m) Weight: 164 lb (74.4 kg) BMI (Calculated): 29.99 Weight at Last Visit: 168 lb Weight Lost Since Last Visit: 4 lb Weight Gained Since Last Visit: 0 Starting Weight: 175 lb Total Weight Loss (lbs): 11 lb (4.99 kg) Peak Weight: 203 lb   Body Composition  Body Fat %: 34.7 % Fat Mass (lbs): 57.2 lbs Muscle Mass (lbs): 102 lbs Total Body Water (lbs): 67.2 lbs Visceral Fat Rating : 10   Other Clinical Data Fasting: no Labs: no Today's Visit #: 5 Starting Date: 04/03/23     HPI  Chief Complaint: OBESITY  Robin Martinez is here to discuss her progress with her obesity treatment plan. She is on the the Category 2 Plan and keeping a food journal and adhering to recommended goals of 1000-1100 calories and 80 grams of protein and states she is following her eating plan approximately 80 % of the time. She states she is exercising weights/aerobics 30-60 minutes 5 times per week.   Interval History:  Since last office visit she down 4 lb. Down a total of 11 lbs since starting program and down ~ 19 lbs since the summer.  The patient is a 72 year old female with a history of gastric bypass surgery approximately 36 years ago. She presents for a follow-up visit regarding her obesity treatment plan. She reports no portion restriction and is awaiting results from an upper GI series to determine if she is a candidate for a revision of her gastric bypass. The patient's resting metabolic rate is significantly low, measured at 950 calories.  Despite her low metabolism, the patient has been successful in losing weight. She attributes her weight loss to increased physical activity and dietary changes. She reports that her metabolic rate was extremely low, which she believes contributed to her  weight gain. She has made significant changes to her diet, including reducing her sugar intake and increasing her protein intake. She also reports increased exercise, including strength training.  The patient also reports experiencing leg cramps and increased thirst after starting a new blood pressure medication, amlodipine. She is concerned about these symptoms and is considering adding coconut water to her diet to help with the cramps.  The patient is planning a trip to [place] and [place] and expresses concern about maintaining her weight loss during the trip. She plans to continue her dietary changes and increased physical activity during her trip. We discussed travel strategies today, focusing on getting adequate protein and hydrate well with travels.   Bariatric Surgery: Hx of Roux en Y gastric bypass ~ 36 years ago.    She is interested in revision, but has been doing very well with her plan and    Has lost 11 lbs. Since 04/03/23 for TBW loss of 6.3%   Pharmacotherapy: None for weight loss  TREATMENT PLAN FOR OBESITY: Obesity Significant weight loss achieved through increased exercise and dietary changes. Resting metabolic rate measured at 950 calories. -Continue current exercise and dietary regimen. -Strive for daily protein intake of 85 grams. -Consider incorporating coconut water for electrolyte balance and prevention of muscle cramps. Recommended Dietary Goals  Derrick is currently in the action stage of change. As such, her goal is to continue weight management plan. She has agreed to the Category 2 Plan and keeping a food journal and  adhering to recommended goals of 1000-1100 calories and 85 grams of protein.  Behavioral Intervention  We discussed the following Behavioral Modification Strategies today: increasing lean protein intake, decreasing simple carbohydrates , increasing vegetables, increasing lower glycemic fruits, avoiding skipping meals, increasing water intake, work on  meal planning and preparation, work on Counselling psychologist calories using tracking application, emotional eating strategies and understanding the difference between hunger signals and cravings, continue to practice mindfulness when eating, planning for success, and staying on track while traveling and vacationing.  Additional resources provided today: NA  Recommended Physical Activity Goals  Mykale has been advised to work up to 150 minutes of moderate intensity aerobic activity a week and strengthening exercises 2-3 times per week for cardiovascular health, weight loss maintenance and preservation of muscle mass.   She has agreed to Continue current level of physical activity    Pharmacotherapy We discussed various medication options to help Gisele with her weight loss efforts and we both agreed to continue to work on nutritional and behavioral strategies to promote weight loss.      Return in about 4 weeks (around 07/26/2023).Marland Kitchen She was informed of the importance of frequent follow up visits to maximize her success with intensive lifestyle modifications for her multiple health conditions.  PHYSICAL EXAM:  Blood pressure 133/77, pulse 75, temperature 98.3 F (36.8 C), height 5\' 2"  (1.575 m), weight 164 lb (74.4 kg), SpO2 99%. Body mass index is 30 kg/m.  General: She is overweight, cooperative, alert, well developed, and in no acute distress. PSYCH: Has normal mood, affect and thought process.   Cardiovascular: HR 70's BP 133/77 Lungs: Normal breathing effort, no conversational dyspnea.  DIAGNOSTIC DATA REVIEWED:  BMET    Component Value Date/Time   NA 141 04/03/2023 1014   K 4.1 04/03/2023 1014   CL 101 04/03/2023 1014   CO2 25 04/03/2023 1014   GLUCOSE 87 04/03/2023 1014   GLUCOSE 95 10/14/2022 1424   BUN 24 04/03/2023 1014   CREATININE 0.96 04/03/2023 1014   CREATININE 0.90 06/02/2020 1024   CALCIUM 9.8 04/03/2023 1014   Lab Results  Component Value Date    HGBA1C 5.8 (H) 04/03/2023   HGBA1C 5.9 01/08/2013   Lab Results  Component Value Date   INSULIN 3.9 04/03/2023   Lab Results  Component Value Date   TSH 2.190 04/03/2023   CBC    Component Value Date/Time   WBC 4.3 04/03/2023 1014   WBC 4.4 01/25/2022 1032   RBC 4.95 04/03/2023 1014   RBC 4.84 01/25/2022 1032   HGB 14.4 04/03/2023 1014   HCT 42.2 04/03/2023 1014   PLT 240 04/03/2023 1014   MCV 85 04/03/2023 1014   MCH 29.1 04/03/2023 1014   MCHC 34.1 04/03/2023 1014   MCHC 33.2 01/25/2022 1032   RDW 12.5 04/03/2023 1014   Iron Studies    Component Value Date/Time   IRON 63 04/03/2023 1014   TIBC 311 04/03/2023 1014   FERRITIN 115 04/03/2023 1014   IRONPCTSAT 20 04/03/2023 1014   Lipid Panel     Component Value Date/Time   CHOL 233 (H) 04/03/2023 1014   TRIG 104 04/03/2023 1014   HDL 90 04/03/2023 1014   CHOLHDL 3 11/15/2021 0930   VLDL 16.4 11/15/2021 0930   LDLCALC 125 (H) 04/03/2023 1014   LDLCALC 130 (H) 06/02/2020 1024   Hepatic Function Panel     Component Value Date/Time   PROT 7.0 04/03/2023 1014   ALBUMIN 4.4 04/03/2023 1014  AST 16 04/03/2023 1014   ALT 13 04/03/2023 1014   ALKPHOS 137 (H) 04/03/2023 1014   BILITOT 0.8 04/03/2023 1014      Component Value Date/Time   TSH 2.190 04/03/2023 1014   Nutritional Lab Results  Component Value Date   VD25OH 64.3 04/03/2023    ASSOCIATED CONDITIONS ADDRESSED TODAY  ASSESSMENT AND PLAN  Problem List Items Addressed This Visit     Hypertension, essential, benign   Generalized obesity   Prediabetes   History of Roux-en-Y gastric bypass - Primary   BMI 30.0-30.9,adult   S/P Roux en Y Gastric Bypass No current portion restriction. Pending upper GI series to assess for possible revision surgery. -Consider referral to general surgeons for further evaluation and potential ordering of necessary tests.  Prediabetes Last A1c was 5.8- improved, but not at goal. Insulin was 3.9- at goal.    Medication(s): None Polyphagia:No Unable to get [facility] as no insurance coverage, but has made nice progress with nutritional and exercise plan. Has lost 19 lbs overall per patient .  Lab Results  Component Value Date   HGBA1C 5.8 (H) 04/03/2023   HGBA1C 5.9 10/14/2022   HGBA1C 6.0 11/15/2021   HGBA1C 5.4 06/02/2020   HGBA1C 5.9 06/13/2019   Lab Results  Component Value Date   INSULIN 3.9 04/03/2023    Plan:  Continue working on nutrition plan to decrease simple carbohydrates, increase lean proteins and exercise to promote weight loss, improve glycemic control and prevent progression to Type 2 diabetes.    Hypertension Blood pressure controlled at the time of visit, but patient reports higher readings at home and recent leg cramps. -Encourage increased fluid intake. -Consider bringing home blood pressure cuff for calibration comparison.  Follow-up Scheduled for 07/26/2023.  ATTESTASTION STATEMENTS:  Reviewed by clinician on day of visit: allergies, medications, problem list, medical history, surgical history, family history, social history, and previous encounter notes.   I have personally spent 43 minutes total time today in preparation, patient care, nutritional counseling and documentation for this visit, including the following: review of clinical lab tests; review of medical tests/procedures/services.      Ashira Kirsten, PA-C

## 2023-07-17 ENCOUNTER — Other Ambulatory Visit: Payer: Self-pay | Admitting: Family Medicine

## 2023-07-17 DIAGNOSIS — I10 Essential (primary) hypertension: Secondary | ICD-10-CM

## 2023-07-20 DIAGNOSIS — H5213 Myopia, bilateral: Secondary | ICD-10-CM | POA: Diagnosis not present

## 2023-07-21 ENCOUNTER — Encounter: Payer: Self-pay | Admitting: Family Medicine

## 2023-07-21 ENCOUNTER — Ambulatory Visit (INDEPENDENT_AMBULATORY_CARE_PROVIDER_SITE_OTHER): Payer: No Typology Code available for payment source | Admitting: Family Medicine

## 2023-07-21 VITALS — BP 140/90 | HR 67 | Temp 97.4°F | Resp 16 | Ht 62.0 in | Wt 167.0 lb

## 2023-07-21 DIAGNOSIS — H9209 Otalgia, unspecified ear: Secondary | ICD-10-CM

## 2023-07-21 DIAGNOSIS — I10 Essential (primary) hypertension: Secondary | ICD-10-CM | POA: Diagnosis not present

## 2023-07-21 DIAGNOSIS — J029 Acute pharyngitis, unspecified: Secondary | ICD-10-CM

## 2023-07-21 LAB — POCT RAPID STREP A (OFFICE): Rapid Strep A Screen: POSITIVE — AB

## 2023-07-21 MED ORDER — MAGIC MOUTHWASH W/LIDOCAINE
5.0000 mL | Freq: Three times a day (TID) | ORAL | 0 refills | Status: AC | PRN
Start: 2023-07-21 — End: 2023-07-31

## 2023-07-21 MED ORDER — AMOXICILLIN 500 MG PO CAPS
500.0000 mg | ORAL_CAPSULE | Freq: Two times a day (BID) | ORAL | 0 refills | Status: AC
Start: 2023-07-21 — End: 2023-07-31

## 2023-07-21 NOTE — Progress Notes (Signed)
ACUTE VISIT Chief Complaint  Patient presents with   Otalgia    X a week, both ears, painful; having a sore throat as well.    HPI: [name] is a 72 y.o. female with a PMHx significant for non rheumatic mitral valve regurgitation, hypertension, GERD, IFG, and HLD, who is here today complaining of bilateral earache. She says the pain has been off and on for a week but is now getting significantly worse. She describes it as a stabbing pain that worsens when she swallows.  Sore Throat  This is a new problem. The problem has been gradually worsening. There has been no fever. The pain is at a severity of 8/10. The pain is severe. Pertinent negatives include no abdominal pain, diarrhea, headaches, hoarse voice, plugged ear sensation, neck pain, shortness of breath, stridor, swollen glands, trouble swallowing or vomiting. She has had no exposure to strep or mono. She has tried NSAIDs for the symptoms. The treatment provided mild relief.   She denies any hearing changes or ear drainage.  She notes she had nasal congestion, rhinorrhea, cough,and body aches before above symptoms started, all of which have since resolved.  She took robitussin and aspirin today.  Recently back from [place] trip. No sick contact.  BP elevated today, attributed to OTC cold medications she took a week ago for respiratory symptoms. She is on Amlodipine 2.5 mg daily and Triamterene-hydrochlorothiazide 37.5-25 mg daily. She has checked BP at home, 140's/80's.  Lab Results  Component Value Date   NA 141 04/03/2023   CL 101 04/03/2023   K 4.1 04/03/2023   CO2 25 04/03/2023   BUN 24 04/03/2023   CREATININE 0.96 04/03/2023   EGFR 64 04/03/2023   CALCIUM 9.8 04/03/2023   ALBUMIN 4.4 04/03/2023   GLUCOSE 87 04/03/2023   Review of Systems  Constitutional:  Positive for appetite change and fatigue. Negative for activity change.  HENT:  Negative for facial swelling, hoarse voice, sinus pain and trouble  swallowing.   Respiratory:  Negative for shortness of breath, wheezing and stridor.   Cardiovascular:  Negative for chest pain, palpitations and leg swelling.  Gastrointestinal:  Negative for abdominal pain, diarrhea and vomiting.  Musculoskeletal:  Negative for neck pain.  Skin:  Negative for rash.  Neurological:  Negative for weakness and headaches.  Hematological:  Negative for adenopathy. Does not bruise/bleed easily.  See other pertinent positives and negatives in HPI.  Current Outpatient Medications on File Prior to Visit  Medication Sig Dispense Refill   amLODipine (NORVASC) 2.5 MG tablet Take 1 tablet (2.5 mg total) by mouth daily. 30 tablet 1   aspirin EC 81 MG tablet Take 81 mg by mouth daily. Swallow whole.     cholecalciferol (VITAMIN D3) 25 MCG (1000 UNIT) tablet Take 1,000 Units by mouth daily.     Flaxseed, Linseed, (FLAXSEED OIL) 1000 MG CAPS Take 1,000 mg by mouth daily.     rosuvastatin (CRESTOR) 10 MG tablet Take 1 tablet (10 mg total) by mouth 3 (three) times a week. 36 tablet 3   triamterene-hydrochlorothiazide (MAXZIDE-25) 37.5-25 MG tablet TAKE 1 TABLET BY MOUTH ONCE DAILY. APPOINTMENT DUE. 90 tablet 2   vitamin E 180 MG (400 UNITS) capsule Take 400 Units by mouth daily.     No current facility-administered medications on file prior to visit.    Past Medical History:  Diagnosis Date   Allergy    Arthritis    Edema of both lower legs    GERD (gastroesophageal  reflux disease)    Hypertension    Joint pain    Multiple food allergies    Prediabetes    Sciatic nerve pain    Allergies  Allergen Reactions   Peanut-Containing Drug Products     Social History   Socioeconomic History   Marital status: Married    Spouse name: Lenzell   Number of children: Not on file   Years of education: Not on file   Highest education level: Not on file  Occupational History   Occupation: retired  Tobacco Use   Smoking status: Never   Smokeless tobacco: Never   Substance and Sexual Activity   Alcohol use: No   Drug use: No   Sexual activity: Not Currently  Other Topics Concern   Not on file  Social History Narrative   Not on file   Social Determinants of Health   Financial Resource Strain: Low Risk  (06/01/2022)   Overall Financial Resource Strain (CARDIA)    Difficulty of Paying Living Expenses: Not hard at all  Food Insecurity: No Food Insecurity (06/01/2022)   Hunger Vital Sign    Worried About Running Out of Food in the Last Year: Never true    Ran Out of Food in the Last Year: Never true  Transportation Needs: No Transportation Needs (06/01/2022)   PRAPARE - Administrator, Civil Service (Medical): No    Lack of Transportation (Non-Medical): No  Physical Activity: Insufficiently Active (06/01/2022)   Exercise Vital Sign    Days of Exercise per Week: 3 days    Minutes of Exercise per Session: 30 min  Stress: No Stress Concern Present (06/01/2022)   [employer] of Occupational Health - Occupational Stress Questionnaire    Feeling of Stress : Not at all  Social Connections: Socially Integrated (06/01/2022)   Social Connection and Isolation Panel [NHANES]    Frequency of Communication with Friends and Family: More than three times a week    Frequency of Social Gatherings with Friends and Family: More than three times a week    Attends Religious Services: More than 4 times per year    Active Member of Clubs or Organizations: Yes    Attends Banker Meetings: More than 4 times per year    Marital Status: Married   Vitals:   07/21/23 1546 07/21/23 1647  BP: (!) 138/90 (!) 140/90  Pulse: 67   Resp: 16   Temp: (!) 97.4 F (36.3 C)   SpO2: 97%    Body mass index is 30.54 kg/m.  Physical Exam Vitals and nursing note reviewed.  Constitutional:      General: She is not in acute distress.    Appearance: She is well-developed.  HENT:     Head: Normocephalic and atraumatic.     Right Ear: Tympanic  membrane, ear canal and external ear normal.     Left Ear: Tympanic membrane, ear canal and external ear normal.     Mouth/Throat:     Mouth: Mucous membranes are moist.     Pharynx: Posterior oropharyngeal erythema present. No pharyngeal swelling or oropharyngeal exudate.  Eyes:     Conjunctiva/sclera: Conjunctivae normal.  Cardiovascular:     Rate and Rhythm: Normal rate and regular rhythm.  Pulmonary:     Effort: Pulmonary effort is normal. No respiratory distress.     Breath sounds: Normal breath sounds.  Abdominal:     Palpations: Abdomen is soft. There is no mass.     Tenderness: There  is no abdominal tenderness. There is no CVA tenderness.  Musculoskeletal:        General: No edema.  Lymphadenopathy:     Cervical: No cervical adenopathy.     Comments: Tender posterior cervical lymph nodes, right, not enlarged.  Skin:    General: Skin is warm.     Findings: No erythema.  Neurological:     Mental Status: She is alert and oriented to person, place, and time.  Psychiatric:        Mood and Affect: Mood and affect, mood and affect normal.   ASSESSMENT AND PLAN:  Ms. Lahue was seen today for otalgia.   Earache Tis was her initial complaint today, ear examination normal. It seems to be referred pain.  Acute pharyngitis, unspecified etiology Rapid strep here in the office weak positive. Will treat as strep pharyngitic with Amoxicillin x 10 d Magic mouth wash with lidocaine for symptomatic treatment. Sample sent for culture.  -     magic mouthwash w/lidocaine; Take 5 mLs by mouth 3 (three) times daily as needed for up to 10 days for mouth pain. 50 ml of diphenhydramine, alum and mag hydroxide, and lidocaine to make 150 ml  Dispense: 150 mL; Refill: 0 -     POCT rapid strep A -     Culture, Group A Strep -     Amoxicillin; Take 1 capsule (500 mg total) by mouth 2 (two) times daily for 10 days.  Dispense: 20 capsule; Refill: 0  Hypertension, essential, benign Assessment &  Plan: Mildly elevated today. Recommend stopping OTC cold medications. Continue monitoring BP at home. No changes in current management.  Return if symptoms worsen or fail to improve, for keep next appointment.  I, Rolla Etienne Wierda, acting as a scribe for Jacqulyne Gladue [place], MD., have documented all relevant documentation on the behalf of Maribeth Jiles [place], MD, as directed by  Derick Seminara [place], MD while in the presence of Lylla Eifler [place], MD.   I, Susanne Baumgarner [place], MD, have reviewed all documentation for this visit. The documentation on 07/21/23 for the exam, diagnosis, procedures, and orders are all accurate and complete.  Jocee Kissick G. [place], MD  [facility]. [place] office.

## 2023-07-21 NOTE — Assessment & Plan Note (Signed)
Mildly elevated today. Recommend stopping OTC cold medications. Continue monitoring BP at home. No changes in current management.

## 2023-07-21 NOTE — Patient Instructions (Addendum)
A few things to remember from today's visit:  Acute pharyngitis, unspecified etiology - Plan: POC Rapid Strep A, Culture, Group A Strep  Streptococcal pharyngitis - Plan: amoxicillin (AMOXIL) 500 MG capsule  If you need refills for medications you take chronically, please call your pharmacy. Do not use My Chart to request refills or for acute issues that need immediate attention. If you send a my chart message, it may take a few days to be addressed, specially if I am not in the office.  Please be sure medication list is accurate. If a new problem present, please set up appointment sooner than planned today.

## 2023-07-26 ENCOUNTER — Ambulatory Visit (INDEPENDENT_AMBULATORY_CARE_PROVIDER_SITE_OTHER): Payer: No Typology Code available for payment source | Admitting: Physician Assistant

## 2023-07-27 ENCOUNTER — Other Ambulatory Visit: Payer: Self-pay | Admitting: Family Medicine

## 2023-07-27 LAB — CULTURE, GROUP A STREP
Micro Number: 15584887
SPECIMEN QUALITY:: ADEQUATE

## 2023-07-27 LAB — HOUSE ACCOUNT TRACKING

## 2023-07-29 IMAGING — DX DG CHEST 2V
2 series · 2 of 2 positions shown · non-contrast
Comparison: None.

CLINICAL DATA: Shortness of breath.

EXAM:
CHEST - 2 VIEW

[chest pa]
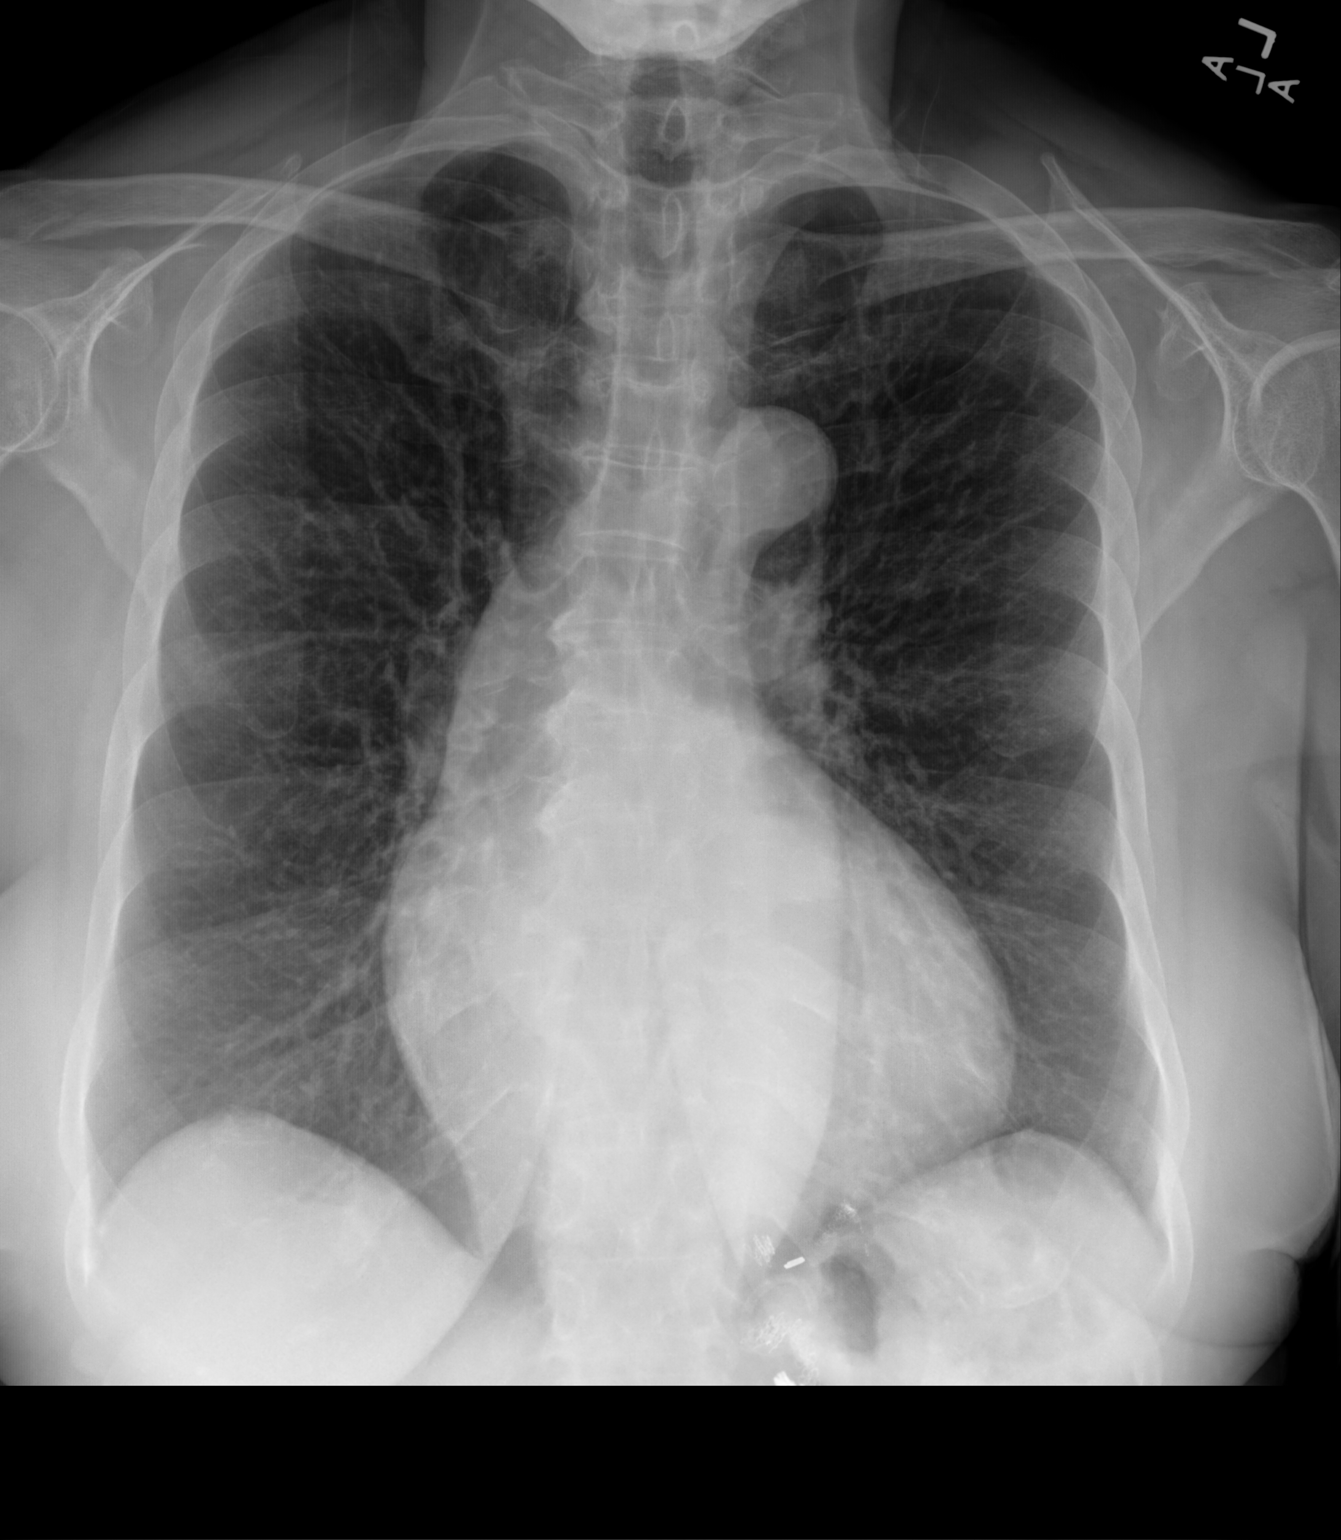

[chest lat]
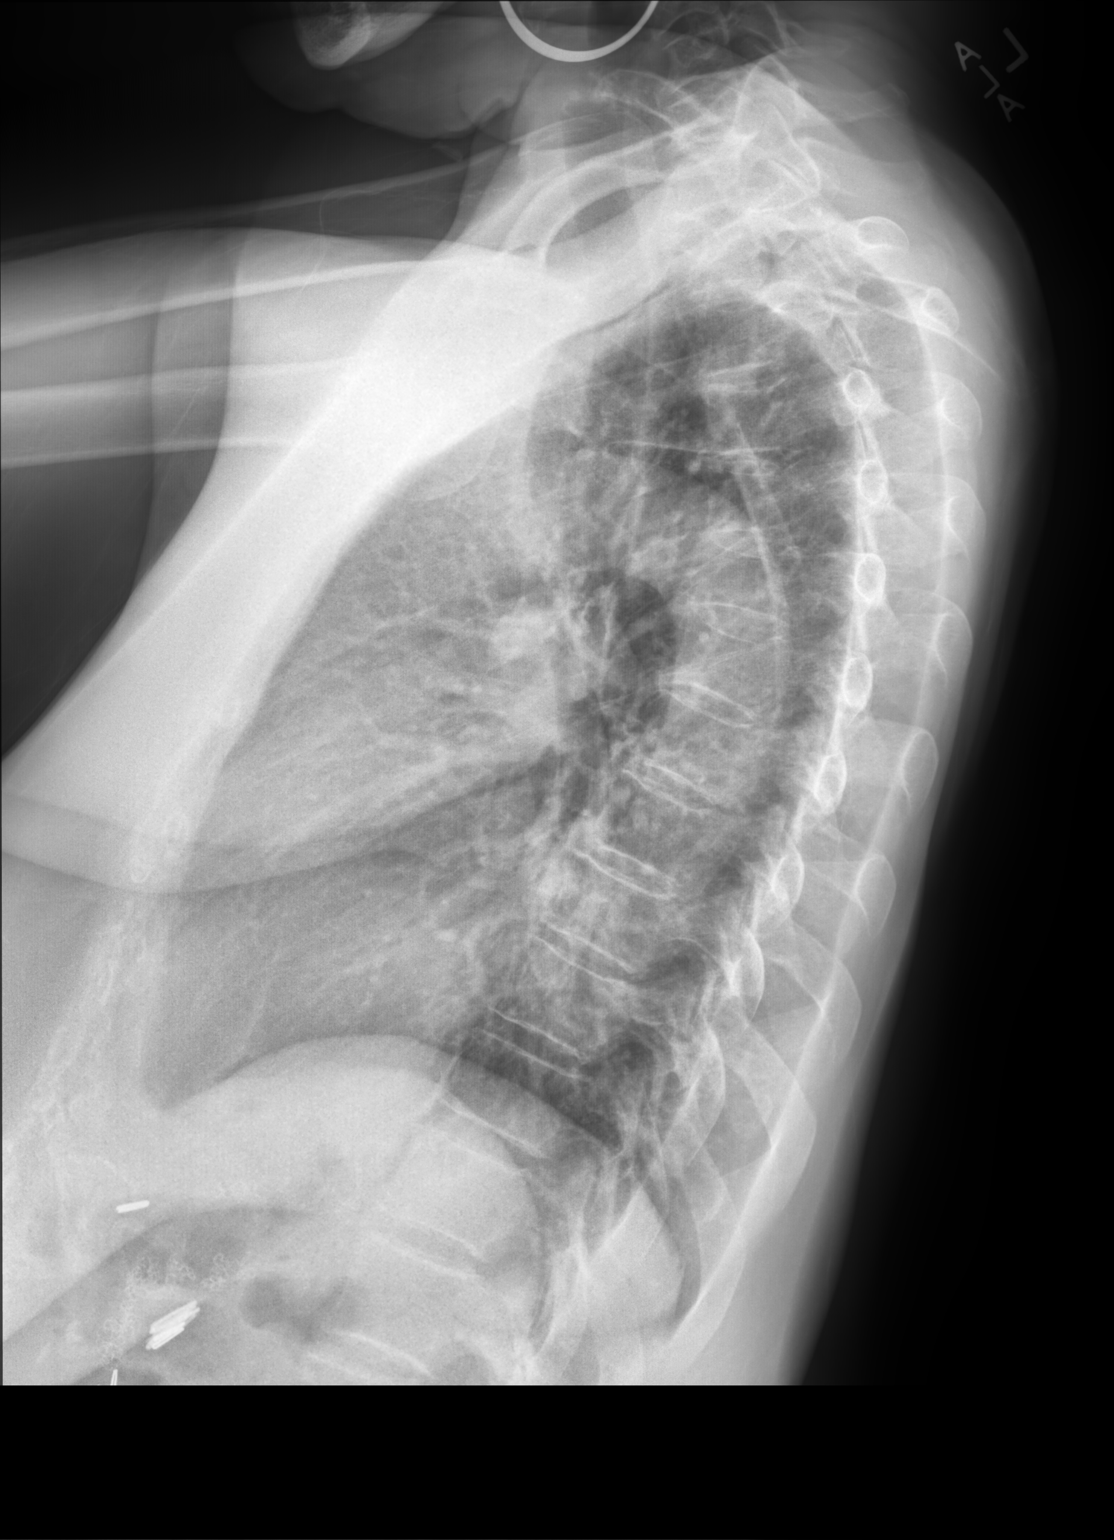

[2 of 2 positions shown; findings below may reference images not displayed]

FINDINGS: Cardiomegaly. Tortuosity of the thoracic aorta. No large area
pulmonary consolidation. No pleural effusion or pneumothorax.
Postsurgical changes upper abdomen. Thoracic spine degenerative
changes.
IMPRESSION: Cardiomegaly.  No acute cardiopulmonary process.

## 2023-08-02 DIAGNOSIS — H4322 Crystalline deposits in vitreous body, left eye: Secondary | ICD-10-CM | POA: Diagnosis not present

## 2023-08-02 DIAGNOSIS — H35423 Microcystoid degeneration of retina, bilateral: Secondary | ICD-10-CM | POA: Diagnosis not present

## 2023-08-02 DIAGNOSIS — H43823 Vitreomacular adhesion, bilateral: Secondary | ICD-10-CM | POA: Diagnosis not present

## 2023-08-03 ENCOUNTER — Ambulatory Visit (INDEPENDENT_AMBULATORY_CARE_PROVIDER_SITE_OTHER): Payer: No Typology Code available for payment source | Admitting: Physician Assistant

## 2023-08-03 ENCOUNTER — Encounter (INDEPENDENT_AMBULATORY_CARE_PROVIDER_SITE_OTHER): Payer: Self-pay | Admitting: Physician Assistant

## 2023-08-03 VITALS — BP 143/88 | HR 61 | Temp 98.3°F | Ht 62.0 in | Wt 162.0 lb

## 2023-08-03 DIAGNOSIS — Z6829 Body mass index (BMI) 29.0-29.9, adult: Secondary | ICD-10-CM | POA: Diagnosis not present

## 2023-08-03 DIAGNOSIS — Z9884 Bariatric surgery status: Secondary | ICD-10-CM

## 2023-08-03 DIAGNOSIS — E669 Obesity, unspecified: Secondary | ICD-10-CM

## 2023-08-03 DIAGNOSIS — R7303 Prediabetes: Secondary | ICD-10-CM

## 2023-08-03 NOTE — Progress Notes (Signed)
.smr  Office: [phone]  /  Fax: [phone]  WEIGHT SUMMARY AND BIOMETRICS  Vitals Temp: 98.3 F (36.8 C) BP: (!) 143/88 Pulse Rate: 61 SpO2: 98 %   Anthropometric Measurements Height: 5\' 2"  (1.575 m) Weight: 162 lb (73.5 kg) BMI (Calculated): 29.62 Weight at Last Visit: 168 lb Weight Lost Since Last Visit: 2 lb Weight Gained Since Last Visit: 0 Starting Weight: 175 lb Total Weight Loss (lbs): 13 lb (5.897 kg) Peak Weight: 203 lb   Body Composition  Body Fat %: 41.7 % Fat Mass (lbs): 67.8 lbs Muscle Mass (lbs): 90.2 lbs Total Body Water (lbs): 64.6 lbs Visceral Fat Rating : 12   Other Clinical Data Fasting: no Labs: no Today's Visit #: 6 Starting Date: 04/03/23     HPI  Chief Complaint: OBESITY  Robin Martinez is here to discuss her progress with her obesity treatment plan. She is on the the Category 1 Plan and keeping a food journal and adhering to recommended goals of 1000-1100 calories and 75 grams of  protein and states she is following her eating plan approximately 70 % of the time. She states she is exercising weights/walking 30 minutes 4 times per week.   Interval History:  Since last office visit she is down 2 lbs. She has just returned from a 13 day vacation to [place]- ate what she wanted.    The patient, with a history of obesity, hypercholesterolemia, and hypertension, presents for a follow-up visit. She recently returned from a trip to [place] where she was quite active, walking extensively, and managed to lose weight despite the trip.  However, she fell ill shortly after returning, experiencing respiratory symptoms and strep throat, which required a course of antibiotics.  She has since recovered from her illness.   She is due for an upper GI but has not received any communication regarding the study from the radiology dept. She has a remote history of gastric bypass ~ 36 years ago .   She plans to travel to [place] for three weeks for the  birth of her grandchild.  She has been struggling with her diet due to her illness and travel, and she acknowledges the need to refocus on her diet, particularly her protein intake.  She is getting back on track with her nutrition plan and exercise now that she is feeling better following treatment for strept throat.   Pharmacotherapy: None for weight loss.  Bariatric Surgery: Hx of Roux en Y gastric bypass ~ 36 years ago.                          She is interested in revision, but has been doing very well with her plan and                          Has lost 13 lbs. Since 04/03/23 for TBW loss of 7.4%  TREATMENT PLAN FOR OBESITY: Obesity Recent travel and illness disrupted adherence to diet plan. Patient reports increased hunger, possibly due to decreased protein intake. -Resume category 1 diet plan with focus on protein intake and controlled snack calories. -Next appointment on 08/31/2023 to assess progress. Recommended Dietary Goals  Robin Martinez is currently in the action stage of change. As such, her goal is to continue weight management plan. She has agreed to the Category 1 Plan.  Behavioral Intervention  We discussed the following Behavioral Modification Strategies today: continue to work on maintaining a reduced  calorie state, getting the recommended amount of protein, incorporating whole foods, making healthy choices, staying well hydrated and practicing mindfulness when eating..  Additional resources provided today: Category 1 plan and grocery list      100 calorie protein snacks  Recommended Physical Activity Goals  Robin Martinez has been advised to work up to 150 minutes of moderate intensity aerobic activity a week and strengthening exercises 2-3 times per week for cardiovascular health, weight loss maintenance and preservation of muscle mass.   She has agreed to Continue current level of physical activity  and Increase physical activity in their day and reduce sedentary time (increase  NEAT).   Pharmacotherapy We discussed various medication options to help Robin Martinez with her weight loss efforts and we both agreed to continue to work on nutritional and behavioral strategies to promote weight loss.     Return in about 4 weeks (around 08/31/2023).Marland Kitchen She was informed of the importance of frequent follow up visits to maximize her success with intensive lifestyle modifications for her multiple health conditions.  PHYSICAL EXAM:  Blood pressure (!) 143/88, pulse 61, temperature 98.3 F (36.8 C), height 5\' 2"  (1.575 m), weight 162 lb (73.5 kg), SpO2 98%. Body mass index is 29.63 kg/m.  General: She is overweight, cooperative, alert, well developed, and in no acute distress. PSYCH: Has normal mood, affect and thought process.   Cardiovascular: HR 60's BP 143/88 Lungs: Normal breathing effort, no conversational dyspnea. Neuro: no focal deficits  DIAGNOSTIC DATA REVIEWED:  BMET    Component Value Date/Time   NA 141 04/03/2023 1014   K 4.1 04/03/2023 1014   CL 101 04/03/2023 1014   CO2 25 04/03/2023 1014   GLUCOSE 87 04/03/2023 1014   GLUCOSE 95 10/14/2022 1424   BUN 24 04/03/2023 1014   CREATININE 0.96 04/03/2023 1014   CREATININE 0.90 06/02/2020 1024   CALCIUM 9.8 04/03/2023 1014   Lab Results  Component Value Date   HGBA1C 5.8 (H) 04/03/2023   HGBA1C 5.9 01/08/2013   Lab Results  Component Value Date   INSULIN 3.9 04/03/2023   Lab Results  Component Value Date   TSH 2.190 04/03/2023   CBC    Component Value Date/Time   WBC 4.3 04/03/2023 1014   WBC 4.4 01/25/2022 1032   RBC 4.95 04/03/2023 1014   RBC 4.84 01/25/2022 1032   HGB 14.4 04/03/2023 1014   HCT 42.2 04/03/2023 1014   PLT 240 04/03/2023 1014   MCV 85 04/03/2023 1014   MCH 29.1 04/03/2023 1014   MCHC 34.1 04/03/2023 1014   MCHC 33.2 01/25/2022 1032   RDW 12.5 04/03/2023 1014   Iron Studies    Component Value Date/Time   IRON 63 04/03/2023 1014   TIBC 311 04/03/2023 1014   FERRITIN  115 04/03/2023 1014   IRONPCTSAT 20 04/03/2023 1014   Lipid Panel     Component Value Date/Time   CHOL 233 (H) 04/03/2023 1014   TRIG 104 04/03/2023 1014   HDL 90 04/03/2023 1014   CHOLHDL 3 11/15/2021 0930   VLDL 16.4 11/15/2021 0930   LDLCALC 125 (H) 04/03/2023 1014   LDLCALC 130 (H) 06/02/2020 1024   Hepatic Function Panel     Component Value Date/Time   PROT 7.0 04/03/2023 1014   ALBUMIN 4.4 04/03/2023 1014   AST 16 04/03/2023 1014   ALT 13 04/03/2023 1014   ALKPHOS 137 (H) 04/03/2023 1014   BILITOT 0.8 04/03/2023 1014      Component Value Date/Time   TSH  2.190 04/03/2023 1014   Nutritional Lab Results  Component Value Date   VD25OH 64.3 04/03/2023    ASSOCIATED CONDITIONS ADDRESSED TODAY  ASSESSMENT AND PLAN  Problem List Items Addressed This Visit     Generalized obesity   Prediabetes - Primary   History of Roux-en-Y gastric bypass   BMI 29.0-29.9,adult Current BMI 29.8    Prediabetes Last A1c was 5.8- not at goal, but improving. Insulin 3.9- at goal.   Medication(s): None Polyphagia:No She is working on a nutrition plan to decrease simple carbohydrates, increase lean proteins and exercise to promote weight loss, improve glycemic control and prevent progression to Type 2 diabetes.   Lab Results  Component Value Date   HGBA1C 5.8 (H) 04/03/2023   HGBA1C 5.9 10/14/2022   HGBA1C 6.0 11/15/2021   HGBA1C 5.4 06/02/2020   HGBA1C 5.9 06/13/2019   Lab Results  Component Value Date   INSULIN 3.9 04/03/2023    Plan:  Continue working on nutrition plan to decrease simple carbohydrates, increase lean proteins and exercise to promote weight loss, improve glycemic control and prevent progression to Type 2 diabetes.     History of Roux en Y gastric bypass:  History of gastric bypass surgery approximately 36 years ago.  Reports no portion restriction and is awaiting results from an upper GI series to determine if she is a candidate for a revision of her  gastric bypass. The patient's resting metabolic rate is significantly low, measured at 950 calories.  Upper GI Study-Pending study not yet scheduled despite previous referrals. -Investigated cause for delay and expedited scheduling and have called [facility] imaging scheduling at [phone] and they are going to call the patient today to make sure it is scheduled.  -Consider referral to [facility] if necessary. ATTESTASTION STATEMENTS:  Reviewed by clinician on day of visit: allergies, medications, problem list, medical history, surgical history, family history, social history, and previous encounter notes.   I have personally spent 38 minutes total time today in preparation, patient care, nutritional counseling and documentation for this visit, including the following: review of clinical lab tests; review of medical tests/procedures/services.      Kimika Streater, PA-C

## 2023-08-08 ENCOUNTER — Other Ambulatory Visit: Payer: Self-pay

## 2023-08-08 ENCOUNTER — Other Ambulatory Visit (HOSPITAL_COMMUNITY): Payer: Self-pay

## 2023-08-08 ENCOUNTER — Encounter: Payer: Self-pay | Admitting: Cardiology

## 2023-08-08 ENCOUNTER — Ambulatory Visit: Attending: Cardiology | Admitting: Cardiology

## 2023-08-08 VITALS — BP 134/82 | HR 78 | Ht 62.0 in | Wt 163.4 lb

## 2023-08-08 DIAGNOSIS — I2510 Atherosclerotic heart disease of native coronary artery without angina pectoris: Secondary | ICD-10-CM | POA: Insufficient documentation

## 2023-08-08 DIAGNOSIS — I519 Heart disease, unspecified: Secondary | ICD-10-CM | POA: Insufficient documentation

## 2023-08-08 DIAGNOSIS — I340 Nonrheumatic mitral (valve) insufficiency: Secondary | ICD-10-CM

## 2023-08-08 DIAGNOSIS — E7800 Pure hypercholesterolemia, unspecified: Secondary | ICD-10-CM

## 2023-08-08 DIAGNOSIS — I10 Essential (primary) hypertension: Secondary | ICD-10-CM

## 2023-08-08 MED ORDER — AMLODIPINE BESYLATE 2.5 MG PO TABS
2.5000 mg | ORAL_TABLET | Freq: Every day | ORAL | 1 refills | Status: DC
  Filled 2023-08-08: qty 30, 30d supply, fill #0

## 2023-08-08 MED ORDER — TRIAMTERENE-HCTZ 37.5-25 MG PO TABS
1.0000 | ORAL_TABLET | Freq: Every day | ORAL | 2 refills | Status: DC
Start: 2023-08-08 — End: 2023-08-08
  Filled 2023-08-08: qty 90, 90d supply, fill #0

## 2023-08-08 MED ORDER — ROSUVASTATIN CALCIUM 10 MG PO TABS
10.0000 mg | ORAL_TABLET | ORAL | 3 refills | Status: DC
  Filled 2023-08-08: qty 36, 84d supply, fill #0
  Filled 2023-10-19: qty 36, 84d supply, fill #1

## 2023-08-08 NOTE — Patient Instructions (Addendum)
Medication Instructions:  Your physician recommends that you continue on your current medications as directed. Please refer to the Current Medication list given to you today. Have your pcp refill your blood pressure medications  *If you need a refill on your cardiac medications before your next appointment, please call your pharmacy*   Lab Work: None ordered   Testing/Procedures: Your physician has requested that you have an echocardiogram. Echocardiography is a painless test that uses sound waves to create images of your heart. It provides your doctor with information about the size and shape of your heart and how well your heart's chambers and valves are working. This procedure takes approximately one hour. There are no restrictions for this procedure. Please do NOT wear cologne, perfume, aftershave, or lotions (deodorant is allowed). Please arrive 15 minutes prior to your appointment time.   Follow-Up: At [facility], you and your health needs are our priority.  As part of our continuing mission to provide you with exceptional heart care, we have created designated Provider Care Teams.  These Care Teams include your primary Cardiologist (physician) and Advanced Practice Providers (APPs -  Physician Assistants and Nurse Practitioners) who all work together to provide you with the care you need, when you need it.  We recommend signing up for the patient portal called "MyChart".  Sign up information is provided on this After Visit Summary.  MyChart is used to connect with patients for Virtual Visits (Telemedicine).  Patients are able to view lab/test results, encounter notes, upcoming appointments, etc.  Non-urgent messages can be sent to your provider as well.   To learn more about what you can do with MyChart, go to [email or web address].    Your next appointment:   3 month(s)  Provider:   Perlie Gold, PA-C        Other Instructions

## 2023-08-08 NOTE — Progress Notes (Signed)
Cardiology Office Note:   Date:  08/08/2023  ID:  Aleiyah Gruszka, DOB 03/11/1951, MRN [id number] PCP: [place], Betty G, MD  [facility] Providers Cardiologist:  Lance Muss, MD    History of Present Illness:   Discussed the use of AI scribe software for clinical note transcription with the patient, who gave verbal consent to proceed.  History of Present Illness   The patient is a 72 year old individual with a history of mild coronary artery disease, mitral regurgitation, hypertension, and mild left ventricular (LV) dysfunction. The patient was initially evaluated for shortness of breath and long-standing palpitations in May 2023. Diagnostic tests revealed possible significant mitral regurgitation and an LV ejection fraction (LVEF) of 45-50%. Further evaluation with transesophageal echocardiogram showed only mild mitral valve regurgitation. Cardiac catheterization revealed 50% stenosis in the proximal right coronary artery (RCA), an aneurysmal segment in the proximal left anterior descending (LAD) artery, mild plaque in the mid LAD, and no obstructive disease in the circumflex artery. Medical management was recommended for nonobstructive coronary artery disease.  Recently, the patient has been monitoring her blood pressure daily due to fluctuations, particularly in the diastolic pressure. The patient's primary care physician added amlodipine to her medication regimen about a month ago due to persistently high blood pressure readings. The patient has been on triamterene hydrochlorothiazide for several years. Per PCP notes, patient was also started on Crestor 10mg , but patient today was unaware of this Rx and has not been taking.   The patient reports feeling generally well, but notes a change in her gait, walking to the side, which she attributes to age. She denies any cardiac symptoms such as palpitations, chest pain, shortness of breath, orthopnea, PND. She does report occasional leg  swelling, which is managed with compression socks, particularly during long periods of sitting or standing.  The patient recently returned from a trip to [place], during which she experienced some shortness of breath and general malaise. She attributes these symptoms to the physical exertion of the trip and a subsequent illness. These symptoms have since resolved.  The patient's most recent labs showed an A1c of 5.8, stable kidney function, and an elevated LDL cholesterol of 125.      Studies Reviewed:    03/10/22 LHC    Ost RCA to Prox RCA lesion is 50% stenosed.   Aneurysmal segment in the proximal LAD. Mild plaque in the mid LAD No obstructive disease in the Circumflex artery The RCA is a large dominant artery with moderate non-obstructive ostial stenosis Normal right and left heart pressures RA 7, RV 34/9/8, PA 33/12 mean 24, PCWP 10, AO 133/87, LV 133/8/8   Recommendations: Her filling pressures are normal. Non-obstructive CAD. Medical management of CAD.   02/22/22 TTE  IMPRESSIONS     1. Left ventricular ejection fraction, by estimation, is 45 to 50%. Left  ventricular ejection fraction by PLAX is 45 %. The left ventricle has  mildly decreased function. The left ventricle demonstrates global  hypokinesis. There is mild left ventricular  hypertrophy. Left ventricular diastolic parameters are consistent with  Grade I diastolic dysfunction (impaired relaxation).   2. Right ventricular systolic function is normal. The right ventricular  size is normal. There is mildly elevated pulmonary artery systolic  pressure. The estimated right ventricular systolic pressure is 42.9 mmHg.   3. Left atrial size was mildly dilated.   4. Type 1R Fixed atrial septal aneurysm.   5. 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 Severe mitral valve regurgitation.  There is mild  late systolic prolapse of multiple segments of the anterior  leaflet of the mitral valve.   6. Tricuspid valve regurgitation is  moderate to severe.   7. The aortic valve is tricuspid. Aortic valve regurgitation is not  visualized.   8. The inferior vena cava is normal in size with greater than 50%  respiratory variability, suggesting right atrial pressure of 3 mmHg.   Comparison(s): No prior Echocardiogram.   Conclusion(s)/Recommendation(s): Findings concerning for severe MR and  moderate to severe TR, would recommend Transesophageal Echocardiogram for  clarification.   FINDINGS   Left Ventricle: Left ventricular ejection fraction, by estimation, is 45  to 50%. Left ventricular ejection fraction by PLAX is 45 %. The left  ventricle has mildly decreased function. The left ventricle demonstrates  global hypokinesis. The left  ventricular internal cavity size was normal in size. There is mild left  ventricular hypertrophy. Left ventricular diastolic parameters are  consistent with Grade I diastolic dysfunction (impaired relaxation).  Indeterminate filling pressures.   Right Ventricle: The right ventricular size is normal. No increase in  right ventricular wall thickness. Right ventricular systolic function is  normal. There is mildly elevated pulmonary artery systolic pressure. The  tricuspid regurgitant velocity is 3.16   m/s, and with an assumed right atrial pressure of 3 mmHg, the estimated  right ventricular systolic pressure is 42.9 mmHg.   Left Atrium: Left atrial size was mildly dilated.   Right Atrium: Right atrial size was normal in size.   Pericardium: There is no evidence of pericardial effusion.   Mitral Valve: The mitral valve is myxomatous. There is mild late systolic  prolapse of multiple segments of the anterior leaflet of the mitral valve.  There is moderate thickening of the anterior and posterior mitral valve  leaflet(s). Severe mitral valve  regurgitation, with centrally-directed jet.   Tricuspid Valve: The tricuspid valve is grossly normal. Tricuspid valve  regurgitation is moderate  to severe.   Aortic Valve: The aortic valve is tricuspid. Aortic valve regurgitation is  not visualized.   Pulmonic Valve: The pulmonic valve was normal in structure. Pulmonic valve  regurgitation is not visualized.   Aorta: The aortic root and ascending aorta are structurally normal, with  no evidence of dilitation.   Venous: The inferior vena cava is normal in size with greater than 50%  respiratory variability, suggesting right atrial pressure of 3 mmHg.   IAS/Shunts: The interatrial septum is aneurysmal. There is right bowing of  the interatrial septum, suggestive of elevated left atrial pressure. No  atrial level shunt detected by color flow Doppler.   Risk Assessment/Calculations:              Physical Exam:   VS:  BP 134/82   Pulse 78   Ht 5\' 2"  (1.575 m)   Wt 163 lb 6.4 oz (74.1 kg)   SpO2 98%   BMI 29.89 kg/m    Wt Readings from Last 3 Encounters:  08/08/23 163 lb 6.4 oz (74.1 kg)  08/03/23 162 lb (73.5 kg)  07/21/23 167 lb (75.8 kg)     Physical Exam Vitals reviewed.  Constitutional:      Appearance: Normal appearance.  HENT:     Head: Normocephalic.     Nose: Nose normal.  Eyes:     Pupils: Pupils are equal, round, and reactive to light.  Cardiovascular:     Rate and Rhythm: Normal rate and regular rhythm.     Pulses: Normal pulses.     Heart  sounds: Normal heart sounds. No murmur heard.    No friction rub. No gallop.  Pulmonary:     Effort: Pulmonary effort is normal.     Breath sounds: Normal breath sounds.  Abdominal:     General: Abdomen is flat.  Musculoskeletal:     Right lower leg: No edema.     Left lower leg: No edema.  Skin:    General: Skin is warm and dry.     Capillary Refill: Capillary refill takes less than 2 seconds.  Neurological:     General: No focal deficit present.     Mental Status: She is alert and oriented to person, place, and time.  Psychiatric:        Mood and Affect: Mood normal.        Behavior: Behavior  normal.        Thought Content: Thought content normal.        Judgment: Judgment normal.          ASSESSMENT AND PLAN:     Assessment and Plan    Coronary Artery Disease Mild to moderate nonobstructive disease with 50% stenosis in the proximal RCA. LDL elevated at 125. No current symptoms of chest pain or shortness of breath. -Start Rosuvastatin 10mg  daily to lower LDL to target of <70. -Continue ASA 81mg  daily  Hypertension Blood pressure readings fluctuating, with recent readings of 134/82 and 130/80 today. Currently on Amlodipine 2.5 and Triamterene-Hydrochlorothiazide 37.5-25mg . -Continue current antihypertensive regimen. -Monitor blood pressure regularly.  Mitral regurgitation TTE May 2023 revealed possible significant mitral regurgitation and an LV ejection fraction (LVEF) of 45-50%. Further evaluation with transesophageal echocardiogram showed only mild mitral valve regurgitation. -No CHF symptoms. Reassuring physical exam without murmur today.  -Continue to monitor.   Mild Left Ventricular Dysfunction May 2023 TTE showed LVEF of 45-50%. No CHF symptoms -Schedule repeat echocardiogram to reassess cardiac function. -Consider initiation of ARB if LVEF remains mildly reduced.   Follow-up in 3 months to assess response to Rosuvastatin and monitor blood pressure control.             Signed, Perlie Gold, PA-C

## 2023-08-09 ENCOUNTER — Ambulatory Visit (HOSPITAL_COMMUNITY)
Admission: RE | Admit: 2023-08-09 | Discharge: 2023-08-09 | Disposition: A | Discharge status: Home / Self Care | Source: Ambulatory Visit | Attending: Family Medicine | Admitting: Family Medicine

## 2023-08-09 DIAGNOSIS — R635 Abnormal weight gain: Secondary | ICD-10-CM | POA: Insufficient documentation

## 2023-08-09 DIAGNOSIS — Z9884 Bariatric surgery status: Secondary | ICD-10-CM | POA: Insufficient documentation

## 2023-08-30 ENCOUNTER — Other Ambulatory Visit: Payer: Self-pay

## 2023-08-30 ENCOUNTER — Ambulatory Visit (INDEPENDENT_AMBULATORY_CARE_PROVIDER_SITE_OTHER): Payer: No Typology Code available for payment source | Admitting: Internal Medicine

## 2023-08-30 ENCOUNTER — Encounter (INDEPENDENT_AMBULATORY_CARE_PROVIDER_SITE_OTHER): Payer: Self-pay | Admitting: Internal Medicine

## 2023-08-30 ENCOUNTER — Other Ambulatory Visit (HOSPITAL_COMMUNITY): Payer: Self-pay

## 2023-08-30 VITALS — BP 156/99 | HR 86 | Temp 97.8°F | Ht 62.0 in | Wt 162.0 lb

## 2023-08-30 DIAGNOSIS — R7303 Prediabetes: Secondary | ICD-10-CM | POA: Diagnosis not present

## 2023-08-30 DIAGNOSIS — Z6829 Body mass index (BMI) 29.0-29.9, adult: Secondary | ICD-10-CM | POA: Diagnosis not present

## 2023-08-30 DIAGNOSIS — Z9884 Bariatric surgery status: Secondary | ICD-10-CM | POA: Diagnosis not present

## 2023-08-30 DIAGNOSIS — E669 Obesity, unspecified: Secondary | ICD-10-CM | POA: Diagnosis not present

## 2023-08-30 DIAGNOSIS — R948 Abnormal results of function studies of other organs and systems: Secondary | ICD-10-CM

## 2023-08-30 DIAGNOSIS — I10 Essential (primary) hypertension: Secondary | ICD-10-CM

## 2023-08-30 MED ORDER — AMLODIPINE BESYLATE 2.5 MG PO TABS
2.5000 mg | ORAL_TABLET | Freq: Two times a day (BID) | ORAL | 0 refills | Status: DC
  Filled 2023-08-30: qty 60, 30d supply, fill #0

## 2023-08-30 NOTE — Progress Notes (Addendum)
Office: [phone]  /  Fax: [phone]  Weight Summary And Biometrics  Vitals Temp: 97.8 F (36.6 C) BP: (!) 156/99 Pulse Rate: 86 SpO2: 99 %   Anthropometric Measurements Height: 5\' 2"  (1.575 m) Weight: 162 lb (73.5 kg) BMI (Calculated): 29.62 Weight at Last Visit: 162 lb Weight Lost Since Last Visit: 0 lb Weight Gained Since Last Visit: 0 lb Starting Weight: 175 lb Total Weight Loss (lbs): 13 lb (5.897 kg) Peak Weight: 203 lb   Body Composition  Body Fat %: 42.8 % Fat Mass (lbs): 69.4 lbs Muscle Mass (lbs): 88 lbs Total Body Water (lbs): 65 lbs Visceral Fat Rating : 12    No data recorded Today's Visit #: 7  Starting Date: 04/03/23   Subjective   Chief Complaint: Obesity  Teila is here to discuss her progress with her obesity treatment plan. She is on the the Category 1 Plan and states she is following her eating plan approximately 70 % of the time. She states she is exercising 30 minutes 7 times per week.  Interval History:   Discussed the use of AI scribe software for clinical note transcription with the patient, who gave verbal consent to proceed.  History of Present Illness   The patient, with a history of obesity, abnormal metabolism, hypertension, prediabetes, and a Roux-en-Y gastric bypass performed approximately 34 years ago, presents for a follow-up visit. The patient reports a gradual weight regain over the past ten years following an initial significant weight loss post-surgery.  The patient recently underwent an upper GI series to assess the anatomy post-Roux-en-Y. The results suggest a possible reconnection of the stomach to the pouch, which could be contributing to the patient's weight regain. The patient reports no feeling of restriction when eating, although she has noticed a sense of fullness since starting a new diet.  Despite the abnormality detected in the upper GI series, the patient has managed to lose weight, with a current BMI  of 29, no longer in the obesity category. However, the patient expresses a desire to continue losing weight, aiming for the 150s.  The patient's diet consists of two meals and two snacks per day, with a focus on protein intake. However, she expresses difficulty in incorporating enough protein into her diet, particularly from sources other than eggs. The patient also reports a decrease in water intake with the onset of colder weather.  The patient acknowledges the importance of physical activity in boosting metabolism and has been walking regularly. However, she has stopped weight training in the past two weeks, an activity she associates with previous successful weight loss.  The patient's blood pressure has been consistently high, despite taking amlodipine and a diuretic. The patient is scheduled to see her primary care provider for further management of her hypertension. The patient also has an echocardiogram scheduled in the upcoming month.  The patient plans to travel to [place] in the near future for the birth of a grandchild, which may impact her routine and diet. She expresses a commitment to monitoring her blood pressure during this time.        Orexigenic Control:  Denies problems with appetite and hunger signals.  Denies problems with satiety and satiation.  Denies problems with eating patterns and portion control.  Denies abnormal cravings. Denies feeling deprived or restricted.   Barriers identified: none.   Pharmacotherapy for weight loss: She is currently taking no anti-obesity medication.   Assessment and Plan   Treatment Plan For Obesity:  Recommended Dietary  Goals  Mckalyn is currently in the action stage of change. As such, her goal is to continue weight management plan. She has agreed to: continue current plan  Behavioral Intervention  We discussed the following Behavioral Modification Strategies today: continue to work on maintaining a reduced calorie state,  getting the recommended amount of protein, incorporating whole foods, making healthy choices, staying well hydrated and practicing mindfulness when eating..  Additional resources provided today: None  Recommended Physical Activity Goals  Ilar has been advised to work up to 150 minutes of moderate intensity aerobic activity a week and strengthening exercises 2-3 times per week for cardiovascular health, weight loss maintenance and preservation of muscle mass.   She has agreed to :  Think about enjoyable ways to increase daily physical activity and overcoming barriers to exercise and Increase physical activity in their day and reduce sedentary time (increase NEAT).  Pharmacotherapy  We discussed various medication options to help Cedrina with her weight loss efforts and we both agreed to : continue with nutritional and behavioral strategies  Associated Conditions Addressed Today  Assessment and Plan  Assessment and Plan    Obesity   She presents for a follow-up on obesity, with a history of Roux-en-Y gastric bypass 34 years ago, which initially resulted in weight loss followed by a gradual regain. Her current weight is 166 lbs, a decrease from 183 lbs in January. We discussed the impact of inadequate protein intake and reduced metabolism on weight management, emphasizing the importance of consuming 20-30 grams of protein per meal and engaging in physical activity, especially weight training, to boost metabolism and preserve muscle mass. We will increase her protein intake to 20-30 grams per meal, incorporating protein-rich foods such as fish, dairy, lean meats, beans, and protein shakes. She will engage in regular physical activity, including weight training, and monitor weight and dietary intake.  Hypertension   She has hypertension with a current reading of 156/99 mmHg and is on triamterene and amlodipine. We discussed the potential need for twice-daily dosing of amlodipine due to the  rapid transit time associated with her Roux-en-Y gastric bypass, which may reduce the efficacy of extended-release medications. We will adjust amlodipine to twice daily dosing, send the prescription to [facility], and instruct her to monitor her blood pressure regularly at home. She will follow up with her primary care provider for further management.  Prediabetes   She has prediabetes and we discussed the importance of maintaining a balanced diet with adequate protein and regular physical activity to manage blood glucose levels. She will monitor blood glucose levels regularly, maintain a balanced diet with adequate protein, and engage in regular physical activity.  History of Roux-en-Y Gastric Bypass   She has a history of Roux-en-Y gastric bypass 34 years ago. An upper GI series suggests that the stomach is still partially connected to the pouch, which may affect food intake and weight management. No immediate surgical intervention is recommended due to her aversion to further surgery and current weight loss progress. She will continue her current dietary and physical activity regimen and monitor weight and dietary intake.  Follow-up   We will schedule a follow-up appointment in January after the 10th and instruct her to monitor her blood pressure regularly at home.           Objective   Physical Exam:  Blood pressure (!) 156/99, pulse 86, temperature 97.8 F (36.6 C), height 5\' 2"  (1.575 m), weight 162 lb (73.5 kg), SpO2 99%. Body mass  index is 29.63 kg/m.  General: She is overweight, cooperative, alert, well developed, and in no acute distress. PSYCH: Has normal mood, affect and thought process.   HEENT: EOMI, sclerae are anicteric. Lungs: Normal breathing effort, no conversational dyspnea. Extremities: No edema.  Neurologic: No gross sensory or motor deficits. No tremors or fasciculations noted.    Diagnostic Data Reviewed:  BMET    Component Value Date/Time   NA 141  04/03/2023 1014   K 4.1 04/03/2023 1014   CL 101 04/03/2023 1014   CO2 25 04/03/2023 1014   GLUCOSE 87 04/03/2023 1014   GLUCOSE 95 10/14/2022 1424   BUN 24 04/03/2023 1014   CREATININE 0.96 04/03/2023 1014   CREATININE 0.90 06/02/2020 1024   CALCIUM 9.8 04/03/2023 1014   Lab Results  Component Value Date   HGBA1C 5.8 (H) 04/03/2023   HGBA1C 5.9 01/08/2013   Lab Results  Component Value Date   INSULIN 3.9 04/03/2023   Lab Results  Component Value Date   TSH 2.190 04/03/2023   CBC    Component Value Date/Time   WBC 4.3 04/03/2023 1014   WBC 4.4 01/25/2022 1032   RBC 4.95 04/03/2023 1014   RBC 4.84 01/25/2022 1032   HGB 14.4 04/03/2023 1014   HCT 42.2 04/03/2023 1014   PLT 240 04/03/2023 1014   MCV 85 04/03/2023 1014   MCH 29.1 04/03/2023 1014   MCHC 34.1 04/03/2023 1014   MCHC 33.2 01/25/2022 1032   RDW 12.5 04/03/2023 1014   Iron Studies    Component Value Date/Time   IRON 63 04/03/2023 1014   TIBC 311 04/03/2023 1014   FERRITIN 115 04/03/2023 1014   IRONPCTSAT 20 04/03/2023 1014   Lipid Panel     Component Value Date/Time   CHOL 233 (H) 04/03/2023 1014   TRIG 104 04/03/2023 1014   HDL 90 04/03/2023 1014   CHOLHDL 3 11/15/2021 0930   VLDL 16.4 11/15/2021 0930   LDLCALC 125 (H) 04/03/2023 1014   LDLCALC 130 (H) 06/02/2020 1024   Hepatic Function Panel     Component Value Date/Time   PROT 7.0 04/03/2023 1014   ALBUMIN 4.4 04/03/2023 1014   AST 16 04/03/2023 1014   ALT 13 04/03/2023 1014   ALKPHOS 137 (H) 04/03/2023 1014   BILITOT 0.8 04/03/2023 1014      Component Value Date/Time   TSH 2.190 04/03/2023 1014   Nutritional Lab Results  Component Value Date   VD25OH 64.3 04/03/2023    Follow-Up   Return in about 6 weeks (around 10/11/2023) for For Weight Mangement with Dr. Rikki Spearing.Marland Kitchen She was informed of the importance of frequent follow up visits to maximize her success with intensive lifestyle modifications for her multiple health  conditions.  Attestation Statement   Reviewed by clinician on day of visit: allergies, medications, problem list, medical history, surgical history, family history, social history, and previous encounter notes.   I have spent 40 minutes in the care of the patient today including: preparing to see patient (e.g. review and interpretation of tests, old notes ), obtaining and/or reviewing separately obtained history, performing a medically appropriate examination or evaluation, counseling and educating the patient, ordering medications, test or procedures, documenting clinical information in the electronic or other health care record, and independently interpreting results and communicating results to the patient, family, or caregiver   Worthy Rancher, MD

## 2023-08-30 NOTE — Addendum Note (Signed)
Addended by: Lyda Kalata on: 08/30/2023 06:35 PM   Modules accepted: Level of Service

## 2023-08-31 ENCOUNTER — Ambulatory Visit (INDEPENDENT_AMBULATORY_CARE_PROVIDER_SITE_OTHER): Payer: No Typology Code available for payment source | Admitting: Physician Assistant

## 2023-09-14 ENCOUNTER — Other Ambulatory Visit (HOSPITAL_COMMUNITY)

## 2023-09-29 ENCOUNTER — Other Ambulatory Visit: Payer: Self-pay

## 2023-09-29 DIAGNOSIS — I10 Essential (primary) hypertension: Secondary | ICD-10-CM

## 2023-10-11 HISTORY — PX: EYE SURGERY: SHX253

## 2023-10-17 ENCOUNTER — Ambulatory Visit (INDEPENDENT_AMBULATORY_CARE_PROVIDER_SITE_OTHER): Payer: No Typology Code available for payment source | Admitting: Internal Medicine

## 2023-10-19 ENCOUNTER — Other Ambulatory Visit (HOSPITAL_COMMUNITY): Payer: Self-pay

## 2023-10-19 ENCOUNTER — Telehealth: Payer: Self-pay | Admitting: Family Medicine

## 2023-10-19 DIAGNOSIS — I10 Essential (primary) hypertension: Secondary | ICD-10-CM

## 2023-10-19 NOTE — Telephone Encounter (Signed)
 Prescription Request  10/19/2023  LOV: 07/21/2023  What is the name of the medication or equipment? Amlodipine  and Triamterene -hydrochlorothiazide   Have you contacted your pharmacy to request a refill? Yes   Which pharmacy would you like this sent to? [facility] - [place], [place] - [address] [place] [address] Phone: [phone] Fax: [phone]   Patient notified that their request is being sent to the clinical staff for review and that they should receive a response within 2 business days.   Please advise at Mobile [phone] (mobile)

## 2023-10-20 ENCOUNTER — Ambulatory Visit (HOSPITAL_COMMUNITY): Attending: Cardiology

## 2023-10-20 DIAGNOSIS — I340 Nonrheumatic mitral (valve) insufficiency: Secondary | ICD-10-CM

## 2023-10-20 DIAGNOSIS — I10 Essential (primary) hypertension: Secondary | ICD-10-CM | POA: Diagnosis not present

## 2023-10-20 DIAGNOSIS — E7800 Pure hypercholesterolemia, unspecified: Secondary | ICD-10-CM | POA: Diagnosis not present

## 2023-10-20 LAB — ECHOCARDIOGRAM COMPLETE
Area-P 1/2: 3.19 cm2
S' Lateral: 2.8 cm

## 2023-10-20 MED ORDER — AMLODIPINE BESYLATE 2.5 MG PO TABS: 2.5000 mg | ORAL_TABLET | Freq: Two times a day (BID) | ORAL | 2 refills | Status: DC

## 2023-10-20 MED ORDER — TRIAMTERENE-HCTZ 37.5-25 MG PO CAPS: 1.0000 | ORAL_CAPSULE | Freq: Every day | ORAL | 2 refills | Status: DC

## 2023-10-27 DIAGNOSIS — H43812 Vitreous degeneration, left eye: Secondary | ICD-10-CM | POA: Diagnosis not present

## 2023-10-27 DIAGNOSIS — H43392 Other vitreous opacities, left eye: Secondary | ICD-10-CM | POA: Diagnosis not present

## 2023-10-30 ENCOUNTER — Ambulatory Visit (INDEPENDENT_AMBULATORY_CARE_PROVIDER_SITE_OTHER): Payer: Medicare Other | Admitting: Family Medicine

## 2023-10-30 ENCOUNTER — Encounter: Payer: Self-pay | Admitting: Family Medicine

## 2023-10-30 VITALS — BP 128/80 | HR 67 | Resp 16 | Ht 62.0 in | Wt 165.5 lb

## 2023-10-30 DIAGNOSIS — R7303 Prediabetes: Secondary | ICD-10-CM

## 2023-10-30 DIAGNOSIS — E7800 Pure hypercholesterolemia, unspecified: Secondary | ICD-10-CM

## 2023-10-30 DIAGNOSIS — I10 Essential (primary) hypertension: Secondary | ICD-10-CM

## 2023-10-30 DIAGNOSIS — Z1211 Encounter for screening for malignant neoplasm of colon: Secondary | ICD-10-CM

## 2023-10-30 NOTE — Progress Notes (Signed)
HPI: [name] is a 73 y.o. female with a PMHx significant for non rheumatic mitral valve regurgitation, HTN, GERD, IFG, and HLD, who is here today for chronic disease management.   Last seen on 07/21/2023.  She has since seen her cardiologist and weight loss clinic. She also had left eye surgery for floaters on 10/27/2023. Her next follow up for her eye is 11/02/2022.   Hypertension: Currently on amlodipine 2.5 mg daily and triamterene-hydrochlorothiazide 37.5-25 mg daily.  She has not been checking her BP at home because she was traveling in [place] and visiting her daughter in [place] over the last six weeks.   Side effects: none Negative for unusual or severe headache,exertional chest pain, dyspnea,  focal weakness, or edema.  Lab Results  Component Value Date   CREATININE 0.96 04/03/2023   BUN 24 04/03/2023   NA 141 04/03/2023   K 4.1 04/03/2023   CL 101 04/03/2023   CO2 25 04/03/2023   Hyperlipidemia: She has not been taking rosuvastatin 10 mg because she is afraid of possible side effects.  Lab Results  Component Value Date   CHOL 233 (H) 04/03/2023   HDL 90 04/03/2023   LDLCALC 125 (H) 04/03/2023   TRIG 104 04/03/2023   CHOLHDL 3 11/15/2021   Prediabetes: Negative for polyuria,polydipsia,and polyphagia.  Lab Results  Component Value Date   HGBA1C 5.8 (H) 04/03/2023   Review of Systems  Constitutional:  Positive for chills. Negative for fever.  HENT:  Negative for nosebleeds and sore throat.   Eyes:  Negative for discharge.  Respiratory:  Negative for cough and wheezing.   Gastrointestinal:  Negative for abdominal pain, nausea and vomiting.  Endocrine: Negative for cold intolerance and heat intolerance.  Genitourinary:  Negative for decreased urine volume, dysuria and hematuria.  Skin:  Negative for rash.  Neurological:  Negative for syncope and facial asymmetry.  Psychiatric/Behavioral:  Negative for confusion and hallucinations.   See other pertinent  positives and negatives in HPI.  Current Outpatient Medications on File Prior to Visit  Medication Sig Dispense Refill   amLODipine (NORVASC) 2.5 MG tablet Take 1 tablet (2.5 mg total) by mouth 2 (two) times daily. 180 tablet 2   aspirin EC 81 MG tablet Take 81 mg by mouth daily. Swallow whole.     cholecalciferol (VITAMIN D3) 25 MCG (1000 UNIT) tablet Take 1,000 Units by mouth daily.     rosuvastatin (CRESTOR) 10 MG tablet Take 1 tablet (10 mg total) by mouth 3 (three) times a week. 36 tablet 3   triamterene-hydrochlorothiazide (DYAZIDE) 37.5-25 MG capsule Take 1 each (1 capsule total) by mouth daily. 90 capsule 2   vitamin E 180 MG (400 UNITS) capsule Take 400 Units by mouth daily.     No current facility-administered medications on file prior to visit.   Past Medical History:  Diagnosis Date   Allergy    Arthritis    Edema of both lower legs    GERD (gastroesophageal reflux disease)    Hypertension    Joint pain    Multiple food allergies    Prediabetes    Sciatic nerve pain    Allergies  Allergen Reactions   Peanut-Containing Drug Products    Social History   Socioeconomic History   Marital status: Married    Spouse name: Lenzell   Number of children: Not on file   Years of education: Not on file   Highest education level: Not on file  Occupational History   Occupation: retired  Tobacco Use   Smoking status: Never   Smokeless tobacco: Never  Substance and Sexual Activity   Alcohol use: No   Drug use: No   Sexual activity: Not Currently  Other Topics Concern   Not on file  Social History Narrative   Not on file   Social Drivers of Health   Financial Resource Strain: Low Risk  (06/01/2022)   Overall Financial Resource Strain (CARDIA)    Difficulty of Paying Living Expenses: Not hard at all  Food Insecurity: No Food Insecurity (06/01/2022)   Hunger Vital Sign    Worried About Running Out of Food in the Last Year: Never true    Ran Out of Food in the Last  Year: Never true  Transportation Needs: No Transportation Needs (06/01/2022)   PRAPARE - Administrator, Civil Service (Medical): No    Lack of Transportation (Non-Medical): No  Physical Activity: Insufficiently Active (06/01/2022)   Exercise Vital Sign    Days of Exercise per Week: 3 days    Minutes of Exercise per Session: 30 min  Stress: No Stress Concern Present (06/01/2022)   [employer] of Occupational Health - Occupational Stress Questionnaire    Feeling of Stress : Not at all  Social Connections: Socially Integrated (06/01/2022)   Social Connection and Isolation Panel [NHANES]    Frequency of Communication with Friends and Family: More than three times a week    Frequency of Social Gatherings with Friends and Family: More than three times a week    Attends Religious Services: More than 4 times per year    Active Member of [employer] or Organizations: Yes    Attends Banker Meetings: More than 4 times per year    Marital Status: Married   Vitals:   10/30/23 1403  BP: 128/80  Pulse: 67  Resp: 16  SpO2: 97%   Body mass index is 30.27 kg/m.  Physical Exam Vitals and nursing note reviewed.  Constitutional:      General: She is not in acute distress.    Appearance: She is well-developed.  HENT:     Head: Normocephalic and atraumatic.     Mouth/Throat:     Mouth: Mucous membranes are moist.     Pharynx: Oropharynx is clear.  Eyes:     General:        Right eye: No discharge.     Extraocular Movements: Extraocular movements intact.     Conjunctiva/sclera:     Left eye: Hemorrhage (s/p eye surgery 3 days ago.) present.  Cardiovascular:     Rate and Rhythm: Normal rate and regular rhythm.     Pulses:          Posterior tibial pulses are 2+ on the right side and 2+ on the left side.     Heart sounds: No murmur heard. Pulmonary:     Effort: Pulmonary effort is normal. No respiratory distress.     Breath sounds: Normal breath sounds.  Abdominal:      Palpations: Abdomen is soft. There is no hepatomegaly or mass.     Tenderness: There is no abdominal tenderness.  Musculoskeletal:     Right lower leg: No edema.     Left lower leg: No edema.  Skin:    General: Skin is warm.     Findings: No erythema or rash.  Neurological:     General: No focal deficit present.     Mental Status: She is alert and oriented to person, place,  and time.     Cranial Nerves: No cranial nerve deficit.     Gait: Gait normal.  Psychiatric:        Mood and Affect: Mood and affect normal.    ASSESSMENT AND PLAN:  Ms. Riff was seen today for chronic disease management.   Orders Placed This Encounter  Procedures   Comprehensive metabolic panel   Hemoglobin A1c   Ambulatory referral to Gastroenterology   Lab Results  Component Value Date   HGBA1C 5.9 10/30/2023   Lab Results  Component Value Date   NA 138 10/30/2023   CL 100 10/30/2023   K 3.7 10/30/2023   CO2 29 10/30/2023   BUN 17 10/30/2023   CREATININE 0.78 10/30/2023   GFR 76.84 10/30/2023   CALCIUM 9.6 10/30/2023   ALBUMIN 4.4 10/30/2023   GLUCOSE 79 10/30/2023   Lab Results  Component Value Date   ALT 16 10/30/2023   AST 20 10/30/2023   ALKPHOS 97 10/30/2023   BILITOT 0.8 10/30/2023    Prediabetes Assessment & Plan: Last hemoglobin A1c 5.8 in 03/2023. Encouraged consistency with a healthy lifestyle for diabetes prevention. Further recommendation will be given according to hemoglobin A1c result.  Orders: -     Hemoglobin A1c; Future  Pure hypercholesterolemia Assessment & Plan: She is not on pharmacologic treatment, she is afraid of possible side effects from rosuvastatin. Continue nonpharmacologic treatment. Will plan on checking lipid panel at next visit.  Orders: -     Comprehensive metabolic panel; Future  Hypertension, essential, benign Assessment & Plan: Otherwise adequately controlled. Recommend monitoring BP at home. Continue amlodipine 2.5 mg daily and  triamterene-HCTZ 37.5-25 mg daily as well as low-salt diet. Eye exam is current.  Orders: -     Comprehensive metabolic panel; Future  Colon cancer screening -     Ambulatory referral to Gastroenterology   Return for chronic problems.  I, Rolla Etienne Wierda, acting as a scribe for Innocence Schlotzhauer [place], MD., have documented all relevant documentation on the behalf of Kenasia Scheller [place], MD, as directed by  Janasia Coverdale [place], MD while in the presence of Saidee Geremia [place], MD.   I, Berry Godsey [place], MD, have reviewed all documentation for this visit. The documentation on 10/30/23 for the exam, diagnosis, procedures, and orders are all accurate and complete.  Drexel Ivey G. [place], MD  [facility]. [place] office.

## 2023-10-30 NOTE — Assessment & Plan Note (Signed)
She is not on pharmacologic treatment, she is afraid of possible side effects from rosuvastatin. Continue nonpharmacologic treatment. Will plan on checking lipid panel at next visit.

## 2023-10-30 NOTE — Patient Instructions (Addendum)
A few things to remember from today's visit:  Prediabetes - Plan: Hemoglobin A1c  Pure hypercholesterolemia - Plan: Comprehensive metabolic panel  Hypertension, essential, benign - Plan: Comprehensive metabolic panel  Colon cancer screening - Plan: Ambulatory referral to Gastroenterology  No changes today. Monitor blood pressure at home.  If you need refills for medications you take chronically, please call your pharmacy. Do not use My Chart to request refills or for acute issues that need immediate attention. If you send a my chart message, it may take a few days to be addressed, specially if I am not in the office.  Please be sure medication list is accurate. If a new problem present, please set up appointment sooner than planned today.

## 2023-10-30 NOTE — Assessment & Plan Note (Signed)
Last hemoglobin A1c 5.8 in 03/2023. Encouraged consistency with a healthy lifestyle for diabetes prevention. Further recommendation will be given according to hemoglobin A1c result.

## 2023-10-30 NOTE — Assessment & Plan Note (Signed)
Otherwise adequately controlled. Recommend monitoring BP at home. Continue amlodipine 2.5 mg daily and triamterene-HCTZ 37.5-25 mg daily as well as low-salt diet. Eye exam is current.

## 2023-10-31 LAB — COMPREHENSIVE METABOLIC PANEL
ALT: 16 U/L (ref 0–35)
AST: 20 U/L (ref 0–37)
Albumin: 4.4 g/dL (ref 3.5–5.2)
Alkaline Phosphatase: 97 U/L (ref 39–117)
BUN: 17 mg/dL (ref 6–23)
CO2: 29 meq/L (ref 19–32)
Calcium: 9.6 mg/dL (ref 8.4–10.5)
Chloride: 100 meq/L (ref 96–112)
Creatinine, Ser: 0.78 mg/dL (ref 0.40–1.20)
GFR: 76.84 mL/min (ref >60.00)
Glucose, Bld: 79 mg/dL (ref 70–99)
Potassium: 3.7 meq/L (ref 3.5–5.1)
Sodium: 138 meq/L (ref 135–145)
Total Bilirubin: 0.8 mg/dL (ref 0.2–1.2)
Total Protein: 7.6 g/dL (ref 6.0–8.3)

## 2023-10-31 LAB — HEMOGLOBIN A1C: Hgb A1c MFr Bld: 5.9 % (ref 4.6–6.5)

## 2023-11-02 ENCOUNTER — Encounter: Payer: Self-pay | Admitting: Family Medicine

## 2023-11-08 ENCOUNTER — Encounter (INDEPENDENT_AMBULATORY_CARE_PROVIDER_SITE_OTHER): Payer: Self-pay | Admitting: Internal Medicine

## 2023-11-08 ENCOUNTER — Ambulatory Visit (INDEPENDENT_AMBULATORY_CARE_PROVIDER_SITE_OTHER): Payer: Medicare Other | Admitting: Internal Medicine

## 2023-11-08 VITALS — BP 134/82 | HR 70 | Temp 98.2°F | Ht 62.0 in | Wt 161.0 lb

## 2023-11-08 DIAGNOSIS — Z9884 Bariatric surgery status: Secondary | ICD-10-CM | POA: Diagnosis not present

## 2023-11-08 DIAGNOSIS — R948 Abnormal results of function studies of other organs and systems: Secondary | ICD-10-CM | POA: Diagnosis not present

## 2023-11-08 DIAGNOSIS — E669 Obesity, unspecified: Secondary | ICD-10-CM | POA: Diagnosis not present

## 2023-11-08 DIAGNOSIS — Z6829 Body mass index (BMI) 29.0-29.9, adult: Secondary | ICD-10-CM

## 2023-11-08 DIAGNOSIS — I10 Essential (primary) hypertension: Secondary | ICD-10-CM

## 2023-11-08 DIAGNOSIS — R7303 Prediabetes: Secondary | ICD-10-CM

## 2023-11-08 NOTE — Assessment & Plan Note (Signed)
Most recent A1c is  Lab Results  Component Value Date   HGBA1C 5.9 10/30/2023   HGBA1C 5.9 01/08/2013    Patient aware of disease state and risk of progression. This may contribute to abnormal cravings, fatigue and diabetic complications without having diabetes.   Treatment options include losing 7 to 10% of body weight, increasing physical activity to a goal of 150 minutes a week at moderate intensity.  She may also be a candidate for pharmacoprophylaxis with metformin

## 2023-11-08 NOTE — Assessment & Plan Note (Signed)
I reviewed vitamin levels and she does not have any serological evidence of nutritional deficiencies.  She is not taking a multivitamin.  I reviewed metabolic adaptation changes that occur post Roux-en-Y.   Upper GI from 2024 showed "IMPRESSION: Proximal gastric banding and gastrojejunostomy, with prompt preferential emptying into the proximal jejunum. No evidence of anastomotic stricture, ulcer, or contrast extravasation.   The remainder of the stomach is unremarkable in appearance, and has not been surgically divided, as seen with typical Roux-en-Y gastric bypass procedure.   Mild esophageal dysmotility.   No evidence of hiatal hernia or esophageal stricture."

## 2023-11-08 NOTE — Progress Notes (Signed)
Office: [phone]  /  Fax: [phone]  Weight Summary And Biometrics  Vitals Temp: 98.2 F (36.8 C) BP: 134/82 Pulse Rate: 70 SpO2: 100 %   Anthropometric Measurements Height: 5\' 2"  (1.575 m) Weight: 161 lb (73 kg) BMI (Calculated): 29.44 Weight at Last Visit: 162 lb Weight Lost Since Last Visit: 1 lb Weight Gained Since Last Visit: 0 lb Starting Weight: 175 lb Total Weight Loss (lbs): 14 lb (6.35 kg) Peak Weight: 203 lb   Body Composition  Body Fat %: 43.2 % Fat Mass (lbs): 69.6 lbs Muscle Mass (lbs): 87 lbs Total Body Water (lbs): 64.4 lbs Visceral Fat Rating : 12    No data recorded Today's Visit #: 8  Starting Date: 04/03/23   Subjective   Chief Complaint: Obesity  Robin Martinez is here to discuss her progress with her obesity treatment plan. She is on the the Category 1 Plan and states she is following her eating plan approximately 60 % of the time. She states she is exercising 30 minutes 5 times per week.  Weight Progress Since Last Visit:  Discussed the use of AI scribe software for clinical note transcription with the patient, who gave verbal consent to proceed.  History of Present Illness   The patient, with abnormal metabolic rate, hypertension, prediabetes, and history of Roux-en-Y surgery, presents for medical weight management.  She has lost one pound since her last visit, with a current BMI of 29. She is focused on weight loss and understands the importance of nutrition and meal frequency in boosting her metabolic rate.  She faces challenges in maintaining nutrition, particularly during a recent trip to [place] where frequent dining out made it difficult to adhere to her dietary preferences. Despite this, she has managed to lose weight and is working on incorporating more protein into her diet, expressing a strong desire for protein. She finds eating every five to six hours with meals containing 30 grams of protein effective.  She is  hesitant to take a bariatric multivitamin due to previously high B12 levels from supplements. She is concerned about elevated B12 levels recurring.  She recently underwent surgery on her left eye for floaters, which had been numerous and connected, impacting her vision.  She has a history of using appetite suppressants but found them ineffective long-term and is not interested in using them again.       Challenges affecting patient progress: none.   Orexigenic Control: Denies problems with appetite and hunger signals.  Denies problems with satiety and satiation.  Denies problems with eating patterns and portion control.  Denies abnormal cravings. Denies feeling deprived or restricted.   Pharmacotherapy for weight management: She is currently taking no anti-obesity medication.   Assessment and Plan   Treatment Plan For Obesity:  Recommended Dietary Goals  Robin Martinez is currently in the action stage of change. As such, her goal is to continue weight management plan. She has agreed to: continue current plan  Behavioral Health and Counseling  We discussed the following behavioral modification strategies today: continue to work on maintaining a reduced calorie state, getting the recommended amount of protein, incorporating whole foods, making healthy choices, staying well hydrated and practicing mindfulness when eating..  Additional education and resources provided today: None  Recommended Physical Activity Goals  Robin Martinez has been advised to work up to 150 minutes of moderate intensity aerobic activity a week and strengthening exercises 2-3 times per week for cardiovascular health, weight loss maintenance and preservation of muscle mass.   She  has agreed to :  Think about enjoyable ways to increase daily physical activity and overcoming barriers to exercise and Increase physical activity in their day and reduce sedentary time (increase NEAT).  Pharmacotherapy  We discussed various  medication options to help Robin Martinez with her weight loss efforts and we both agreed to : continue with nutritional and behavioral strategies  Associated Conditions Impacted by Obesity Treatment  Abnormal metabolism Assessment & Plan: Patient has a slower than predicted metabolism. IC 950 vs. calculated 1337.This may contribute to weight gain, chronic fatigue and difficulty losing weight. We reviewed measures to improve metabolism including progressive strengthening exercises, increasing protein intake at every meal and maintaining adequate hydration and sleep.      History of Roux-en-Y gastric bypass Assessment & Plan: I reviewed vitamin levels and she does not have any serological evidence of nutritional deficiencies.  She is not taking a multivitamin.  I reviewed metabolic adaptation changes that occur post Roux-en-Y.   Upper GI from 2024 showed "IMPRESSION: Proximal gastric banding and gastrojejunostomy, with prompt preferential emptying into the proximal jejunum. No evidence of anastomotic stricture, ulcer, or contrast extravasation.   The remainder of the stomach is unremarkable in appearance, and has not been surgically divided, as seen with typical Roux-en-Y gastric bypass procedure.   Mild esophageal dysmotility.   No evidence of hiatal hernia or esophageal stricture."   Prediabetes Assessment & Plan: Most recent A1c is  Lab Results  Component Value Date   HGBA1C 5.9 10/30/2023   HGBA1C 5.9 01/08/2013    Patient aware of disease state and risk of progression. This may contribute to abnormal cravings, fatigue and diabetic complications without having diabetes.   Treatment options include losing 7 to 10% of body weight, increasing physical activity to a goal of 150 minutes a week at moderate intensity.  She may also be a candidate for pharmacoprophylaxis with metformin     Generalized obesity  Hypertension, essential, benign Assessment & Plan: Improved on  triamterene hydrochlorothiazide and amlodipine.  Continue current regimen      Assessment and Plan    Abnormal Metabolic Rate   Post-Roux-en-Y Gastric Bypass Nutritional Deficiency Not taking a bariatric multivitamin due to concerns about high B12 levels. Explained the importance of a comprehensive bariatric multivitamin and addressed B12 concerns. Advised stopping individual supplements and switching to a bariatric multivitamin. Informed that high B12 levels could be due to recent intake before testing and recommended stopping vitamins 2 days before future tests. - Order Bariatric Choice once daily multivitamin - Stop individual vitamin supplements - Recheck vitamin levels, ensuring to stop vitamins 2 days before testing  Hypertension Well-managed hypertension. No changes in treatment discussed during this visit.  Prediabetes Well-managed prediabetes. No changes in treatment discussed during this visit.  General Health Maintenance Emphasized maintaining a healthy lifestyle through consistent dietary habits and physical activity. Explained that physical activity, protein intake, and meal frequency are key to increasing metabolic rate. - Encourage consistent healthy eating habits - Normalize physical activity as part of daily routine  Follow-up - Schedule follow-up appointment in one month.        Objective   Physical Exam:  Blood pressure 134/82, pulse 70, temperature 98.2 F (36.8 C), height 5\' 2"  (1.575 m), weight 161 lb (73 kg), SpO2 100%. Body mass index is 29.45 kg/m.  General: She is overweight, cooperative, alert, well developed, and in no acute distress. PSYCH: Has normal mood, affect and thought process.   HEENT: EOMI, sclerae are anicteric. Lungs: Normal breathing  effort, no conversational dyspnea. Extremities: No edema.  Neurologic: No gross sensory or motor deficits. No tremors or fasciculations noted.    Diagnostic Data Reviewed:  BMET    Component  Value Date/Time   NA 138 10/30/2023 1431   NA 141 04/03/2023 1014   K 3.7 10/30/2023 1431   CL 100 10/30/2023 1431   CO2 29 10/30/2023 1431   GLUCOSE 79 10/30/2023 1431   BUN 17 10/30/2023 1431   BUN 24 04/03/2023 1014   CREATININE 0.78 10/30/2023 1431   CREATININE 0.90 06/02/2020 1024   CALCIUM 9.6 10/30/2023 1431   Lab Results  Component Value Date   HGBA1C 5.9 10/30/2023   HGBA1C 5.9 01/08/2013   Lab Results  Component Value Date   INSULIN 3.9 04/03/2023   Lab Results  Component Value Date   TSH 2.190 04/03/2023   CBC    Component Value Date/Time   WBC 4.3 04/03/2023 1014   WBC 4.4 01/25/2022 1032   RBC 4.95 04/03/2023 1014   RBC 4.84 01/25/2022 1032   HGB 14.4 04/03/2023 1014   HCT 42.2 04/03/2023 1014   PLT 240 04/03/2023 1014   MCV 85 04/03/2023 1014   MCH 29.1 04/03/2023 1014   MCHC 34.1 04/03/2023 1014   MCHC 33.2 01/25/2022 1032   RDW 12.5 04/03/2023 1014   Iron Studies    Component Value Date/Time   IRON 63 04/03/2023 1014   TIBC 311 04/03/2023 1014   FERRITIN 115 04/03/2023 1014   IRONPCTSAT 20 04/03/2023 1014   Lipid Panel     Component Value Date/Time   CHOL 233 (H) 04/03/2023 1014   TRIG 104 04/03/2023 1014   HDL 90 04/03/2023 1014   CHOLHDL 3 11/15/2021 0930   VLDL 16.4 11/15/2021 0930   LDLCALC 125 (H) 04/03/2023 1014   LDLCALC 130 (H) 06/02/2020 1024   Hepatic Function Panel     Component Value Date/Time   PROT 7.6 10/30/2023 1431   PROT 7.0 04/03/2023 1014   ALBUMIN 4.4 10/30/2023 1431   ALBUMIN 4.4 04/03/2023 1014   AST 20 10/30/2023 1431   ALT 16 10/30/2023 1431   ALKPHOS 97 10/30/2023 1431   BILITOT 0.8 10/30/2023 1431   BILITOT 0.8 04/03/2023 1014      Component Value Date/Time   TSH 2.190 04/03/2023 1014   Nutritional Lab Results  Component Value Date   VD25OH 64.3 04/03/2023    Follow-Up   Return in about 4 weeks (around 12/06/2023) for For Weight Mangement with Dr. Rikki Spearing.Marland Kitchen She was informed of the  importance of frequent follow up visits to maximize her success with intensive lifestyle modifications for her multiple health conditions.  Attestation Statement   Reviewed by clinician on day of visit: allergies, medications, problem list, medical history, surgical history, family history, social history, and previous encounter notes.     Worthy Rancher, MD

## 2023-11-08 NOTE — Assessment & Plan Note (Signed)
Patient has a slower than predicted metabolism. IC 950 vs. calculated 1337.This may contribute to weight gain, chronic fatigue and difficulty losing weight. We reviewed measures to improve metabolism including progressive strengthening exercises, increasing protein intake at every meal and maintaining adequate hydration and sleep.

## 2023-11-08 NOTE — Assessment & Plan Note (Signed)
Improved on triamterene hydrochlorothiazide and amlodipine.  Continue current regimen

## 2023-11-09 ENCOUNTER — Ambulatory Visit: Payer: Medicare Other | Admitting: Podiatry

## 2023-11-09 ENCOUNTER — Encounter: Payer: Self-pay | Admitting: Podiatry

## 2023-11-09 DIAGNOSIS — M7752 Other enthesopathy of left foot: Secondary | ICD-10-CM

## 2023-11-09 DIAGNOSIS — Q828 Other specified congenital malformations of skin: Secondary | ICD-10-CM | POA: Diagnosis not present

## 2023-11-09 MED ORDER — TRIAMCINOLONE ACETONIDE 10 MG/ML IJ SUSP
10.0000 mg | Freq: Once | INTRAMUSCULAR | Status: AC
  Administered 2023-11-09: 10 mg via INTRA_ARTICULAR

## 2023-11-09 NOTE — Progress Notes (Signed)
Subjective:   Patient ID: Robin Martinez, female   DOB: 73 y.o.   MRN: [id number]   HPI Patient presents stating I have several lesions that are very painful   ROS      Objective:  Physical Exam  Neurovascular status intact inflammation pain around the fifth MPJ left fluid buildup with porokeratotic type material underneath     Assessment:  Inflammatory capsulitis fifth MPJ left painful when pressed with patient also having lucent lesions plantar aspect both feet     Plan:  H&P reviewed condition and at this point sterile prep injected the fifth MPJ left plantar capsule 3 mg Dexasone Kenalog 5 mg Xylocaine and then debrided lesions bilateral no iatrogenic bleeding reappoint routine care

## 2023-11-16 ENCOUNTER — Ambulatory Visit: Payer: Medicare Other | Attending: Cardiology | Admitting: Cardiology

## 2023-11-16 ENCOUNTER — Encounter: Payer: Self-pay | Admitting: Cardiology

## 2023-11-16 VITALS — BP 130/64 | HR 57 | Resp 16 | Ht 62.0 in | Wt 163.0 lb

## 2023-11-16 DIAGNOSIS — I2510 Atherosclerotic heart disease of native coronary artery without angina pectoris: Secondary | ICD-10-CM | POA: Diagnosis not present

## 2023-11-16 DIAGNOSIS — I519 Heart disease, unspecified: Secondary | ICD-10-CM

## 2023-11-16 DIAGNOSIS — I10 Essential (primary) hypertension: Secondary | ICD-10-CM

## 2023-11-16 DIAGNOSIS — E7800 Pure hypercholesterolemia, unspecified: Secondary | ICD-10-CM

## 2023-11-16 DIAGNOSIS — I340 Nonrheumatic mitral (valve) insufficiency: Secondary | ICD-10-CM

## 2023-11-16 NOTE — Patient Instructions (Signed)
 Medication Instructions:  Your physician recommends that you continue on your current medications as directed. Please refer to the Current Medication list given to you today. *If you need a refill on your cardiac medications before your next appointment, please call your pharmacy*   Follow-Up: At [facility], you and your health needs are our priority.  As part of our continuing mission to provide you with exceptional heart care, we have created designated Provider Care Teams.  These Care Teams include your primary Cardiologist (physician) and Advanced Practice Providers (APPs -  Physician Assistants and Nurse Practitioners) who all work together to provide you with the care you need, when you need it.  We recommend signing up for the patient portal called MyChart.  Sign up information is provided on this After Visit Summary.  MyChart is used to connect with patients for Virtual Visits (Telemedicine).  Patients are able to view lab/test results, encounter notes, upcoming appointments, etc.  Non-urgent messages can be sent to your provider as well.   To learn more about what you can do with MyChart, go to [email or web address].    Your next appointment:   1 year(s)  Provider:   Artist Pouch, PA  Other Instructions Also, please consider Nexlizet for better cholesterol management - attaching some information on this for you

## 2023-11-16 NOTE — Progress Notes (Signed)
 Cardiology Office Note:   Date:  11/16/2023  ID:  Robin Martinez, DOB 12-22-1950, MRN [id number] PCP: Jordan, Betty G, MD  [facility] Providers Cardiologist:  Candyce Reek, MD    History of Present Illness:   Discussed the use of AI scribe software for clinical note transcription with the patient, who gave verbal consent to proceed.  History of Present Illness   Robin Martinez is a 73 year old female with mild coronary artery disease, mitral regurgitation, and hypertension who presents for follow-up of her cardiac conditions.  She has a history of mild coronary artery disease, initially evaluated in May 2023 for shortness of breath and long-standing palpitations. Diagnostic tests revealed possible mitral regurgitation and a left ventricular ejection fraction of 45-50%. A transthoracic echocardiogram showed mild mitral valve regurgitation, and cardiac catheterization revealed 50% stenosis in the proximal right coronary artery, an aneurysmal segment in the left anterior descending artery, mild plaque in the mid LAD, and no obstructive disease in the circumflex artery. Medical management was recommended for her nonobstructive coronary artery disease.  In October 2024, she experienced blood pressure fluctuations, leading her primary care provider to add amlodipine  to her medication regimen. Her blood pressure was noted to be 134/82 mmHg at that time. No symptoms such as dizziness or leg swelling after starting amlodipine .  January TTE showed recovery of normal LVEF. Her mild mitral valve regurgitation is stable and unchanged from previous echocardiograms. No current palpitations and she notes an improvement in her blood pressure, attributing some of this to dietary changes.  Her current medications include aspirin , amlodipine , and a triamterene -hydrochlorothiazide  combination. She was also prescribed Crestor  but had not started taking it yet and later decided to stop due to concerns about  side effects, despite not experiencing any adverse effects herself.  She engages in regular physical activity, including walking on a treadmill for 30 minutes and walking two miles at a park. No symptoms during these activities. She experienced numbness in her legs and feet upon returning from [place] , which she attributes to the cold weather. She is originally from [place]  and currently resides in [place]  but is considering moving to a less humid area like [place] .      Studies Reviewed:    10/20/23 TTE  IMPRESSIONS     1. Left ventricular ejection fraction, by estimation, is 55 to 60%. Left  ventricular ejection fraction by 3D volume is 55 %. The left ventricle has  normal function. The left ventricle has no regional wall motion  abnormalities. Left ventricular diastolic   parameters were normal.   2. Right ventricular systolic function is normal. The right ventricular  size is mildly enlarged. There is normal pulmonary artery systolic  pressure. The estimated right ventricular systolic pressure is 31.1 mmHg.   3. Atrial septal aneurysm noted.   4. Right atrial size was mildly dilated.   5. The mitral valve is grossly normal. Mild mitral valve regurgitation.  No evidence of mitral stenosis.   6. The aortic valve is tricuspid. Aortic valve regurgitation is not  visualized. No aortic stenosis is present.   7. The inferior vena cava is normal in size with greater than 50%  respiratory variability, suggesting right atrial pressure of 3 mmHg.   Comparison(s): The left ventricular function has improved. MR has improved  with LVEF improvement, suggesting prior MR was functional.   FINDINGS   Left Ventricle: Left ventricular ejection fraction, by estimation, is 55  to 60%. Left ventricular ejection fraction by 3D  volume is 55 %. The left  ventricle has normal function. The left ventricle has no regional wall  motion abnormalities. The left  ventricular internal  cavity size was normal in size. There is no left  ventricular hypertrophy. Left ventricular diastolic parameters were  normal.   Right Ventricle: The right ventricular size is mildly enlarged. No  increase in right ventricular wall thickness. Right ventricular systolic  function is normal. There is normal pulmonary artery systolic pressure.  The tricuspid regurgitant velocity is 2.65   m/s, and with an assumed right atrial pressure of 3 mmHg, the estimated  right ventricular systolic pressure is 31.1 mmHg.   Left Atrium: Left atrial size was normal in size.   Right Atrium: Right atrial size was mildly dilated.   Pericardium: There is no evidence of pericardial effusion.   Mitral Valve: The mitral valve is grossly normal. Mild mitral valve  regurgitation. No evidence of mitral valve stenosis.   Tricuspid Valve: The tricuspid valve is grossly normal. Tricuspid valve  regurgitation is mild . No evidence of tricuspid stenosis.   Aortic Valve: The aortic valve is tricuspid. Aortic valve regurgitation is  not visualized. No aortic stenosis is present.   Pulmonic Valve: The pulmonic valve was grossly normal. Pulmonic valve  regurgitation is not visualized. No evidence of pulmonic stenosis.   Aorta: The aortic root and ascending aorta are structurally normal, with  no evidence of dilitation.   Venous: The right lower pulmonary vein is normal. The inferior vena cava  is normal in size with greater than 50% respiratory variability,  suggesting right atrial pressure of 3 mmHg.   IAS/Shunts: The interatrial septum is aneurysmal. The atrial septum is  grossly normal.    Risk Assessment/Calculations:              Physical Exam:   VS:  BP 130/64 (BP Location: Right Arm, Patient Position: Sitting, Cuff Size: Small)   Pulse (!) 57   Resp 16   Ht 5' 2 (1.575 m)   Wt 163 lb (73.9 kg)   SpO2 98%   BMI 29.81 kg/m    Wt Readings from Last 3 Encounters:  11/16/23 163 lb (73.9 kg)   11/08/23 161 lb (73 kg)  10/30/23 165 lb 8 oz (75.1 kg)     Physical Exam Vitals reviewed.  Constitutional:      Appearance: Normal appearance.  HENT:     Head: Normocephalic.     Nose: Nose normal.  Eyes:     Pupils: Pupils are equal, round, and reactive to light.  Cardiovascular:     Rate and Rhythm: Normal rate and regular rhythm.     Pulses: Normal pulses.     Heart sounds: Murmur (2/6 systolic over apex) heard.     No friction rub. No gallop.  Pulmonary:     Effort: Pulmonary effort is normal.     Breath sounds: Normal breath sounds.  Abdominal:     General: Abdomen is flat.  Musculoskeletal:     Right lower leg: No edema.     Left lower leg: No edema.  Skin:    General: Skin is warm and dry.     Capillary Refill: Capillary refill takes less than 2 seconds.  Neurological:     General: No focal deficit present.     Mental Status: She is alert and oriented to person, place, and time.  Psychiatric:        Mood and Affect: Mood normal.  Behavior: Behavior normal.        Thought Content: Thought content normal.        Judgment: Judgment normal.     ASSESSMENT AND PLAN:     Assessment and Plan    Coronary Artery Disease Nonobstructive disease with 50% stenosis in the proximal right coronary artery and an aneurysmal segment in the left anterior descending artery. LDL was 125 in June 2024. Patient continues to decline statin therapy due to concerns about side effects. -Recommended non-statin cholesterol medication Nexlizet for consideration. -Per patient preference, will defer recheck lipid panel to June 2025 with PCP  Mitral Regurgitation Atrial Septal Aneurysm Stable mild regurgitation with improved LVEF on recent echocardiogram. No symptoms of exertional intolerance. -Continue annual echocardiograms to monitor.  Hypertension Improved control with addition of amlodipine  to medication regimen. Current blood pressure 130/64. -Continue current regimen of  amlodipine  and dyazide .  General Health Maintenance -Continue daily aspirin . -Consider dietary changes, provided handout on Mediterranean diet. -Schedule follow-up visit in one year.            Signed, Artist Pouch, PA-C

## 2023-12-07 DIAGNOSIS — H35423 Microcystoid degeneration of retina, bilateral: Secondary | ICD-10-CM | POA: Diagnosis not present

## 2023-12-07 DIAGNOSIS — H43821 Vitreomacular adhesion, right eye: Secondary | ICD-10-CM | POA: Diagnosis not present

## 2023-12-11 ENCOUNTER — Ambulatory Visit (INDEPENDENT_AMBULATORY_CARE_PROVIDER_SITE_OTHER): Payer: Medicare Other | Admitting: Internal Medicine

## 2023-12-12 ENCOUNTER — Ambulatory Visit (INDEPENDENT_AMBULATORY_CARE_PROVIDER_SITE_OTHER): Admitting: Internal Medicine

## 2023-12-12 ENCOUNTER — Encounter (INDEPENDENT_AMBULATORY_CARE_PROVIDER_SITE_OTHER): Payer: Self-pay | Admitting: Internal Medicine

## 2023-12-12 VITALS — BP 136/84 | HR 63 | Temp 97.3°F | Ht 62.0 in | Wt 159.0 lb

## 2023-12-12 DIAGNOSIS — Z9884 Bariatric surgery status: Secondary | ICD-10-CM | POA: Diagnosis not present

## 2023-12-12 DIAGNOSIS — R7303 Prediabetes: Secondary | ICD-10-CM

## 2023-12-12 DIAGNOSIS — Z6829 Body mass index (BMI) 29.0-29.9, adult: Secondary | ICD-10-CM | POA: Diagnosis not present

## 2023-12-12 DIAGNOSIS — R948 Abnormal results of function studies of other organs and systems: Secondary | ICD-10-CM | POA: Diagnosis not present

## 2023-12-12 DIAGNOSIS — E669 Obesity, unspecified: Secondary | ICD-10-CM | POA: Diagnosis not present

## 2023-12-12 MED ORDER — METFORMIN HCL 500 MG PO TABS: 500.0000 mg | ORAL_TABLET | Freq: Every day | ORAL | 0 refills | Status: DC

## 2023-12-12 NOTE — Assessment & Plan Note (Signed)
 Continue bariatric vitamin.  Upper GI from 2024 showed "IMPRESSION: Proximal gastric banding and gastrojejunostomy, with prompt preferential emptying into the proximal jejunum. No evidence of anastomotic stricture, ulcer, or contrast extravasation.   The remainder of the stomach is unremarkable in appearance, and has not been surgically divided, as seen with typical Roux-en-Y gastric bypass procedure.   Mild esophageal dysmotility.   No evidence of hiatal hernia or esophageal stricture."

## 2023-12-12 NOTE — Assessment & Plan Note (Signed)
 Most recent A1c is  Lab Results  Component Value Date   HGBA1C 5.9 10/30/2023   HGBA1C 5.9 01/08/2013    Patient aware of disease state and risk of progression. This may contribute to abnormal cravings, fatigue and diabetic complications without having diabetes.   Treatment options include losing 7 to 10% of body weight, increasing physical activity to a goal of 150 minutes a week at moderate intensity.  After discussion of benefits and side effect she will be started on metformin immediate release 500 mg 1 tablet daily

## 2023-12-12 NOTE — Assessment & Plan Note (Signed)
 She is affected by obesity with a history of Roux-en-Y gastric bypass surgery. Her metabolic rate is approximately 950 calories per day, lower than the expected 1269 calories. Despite this, she has gradually lost weight from 181 pounds to 159 pounds, with a current BMI of 29. Emphasis is placed on maintaining a calorie deficit, balanced diet, and regular exercise. Metformin was discussed as a potential aid for weight management. - Maintain calorie intake of 1100-1200 calories per day. - Continue regular exercise, varying routines to increase intensity. - Focus on balanced nutrition, including adequate protein intake. - Increase water intake. - Start metformin immediate release 500 mg once a day

## 2023-12-12 NOTE — Assessment & Plan Note (Signed)
 Patient has a slower than predicted metabolism. IC 950 vs. calculated 1337.This may contribute to weight gain, chronic fatigue and difficulty losing weight. We reviewed measures to improve metabolism including progressive strengthening exercises, increasing protein intake at every meal and maintaining adequate hydration and sleep.

## 2023-12-12 NOTE — Progress Notes (Signed)
 Office: [phone]  /  Fax: [phone]  Weight Summary And Biometrics  Vitals Temp: (!) 97.3 F (36.3 C) BP: 136/84 Pulse Rate: 63 SpO2: 100 %   Anthropometric Measurements Height: 5\' 2"  (1.575 m) Weight: 159 lb (72.1 kg) BMI (Calculated): 29.07 Weight at Last Visit: 161 lb Weight Lost Since Last Visit: 2 lb Weight Gained Since Last Visit: 0 Starting Weight: 175 lb Total Weight Loss (lbs): 16 lb (7.258 kg) Peak Weight: 203 lb   Body Composition  Body Fat %: 43 % Fat Mass (lbs): 68.6 lbs Muscle Mass (lbs): 86.2 lbs Total Body Water (lbs): 64.6 lbs Visceral Fat Rating : 12    No data recorded Today's Visit #: 9  Starting Date: 04/03/23   Subjective   Chief Complaint: Obesity  Interval History Discussed the use of AI scribe software for clinical note transcription with the patient, who gave verbal consent to proceed.  History of Present Illness   Robin Martinez is a 73 year old female who presents for medical weight management.  She has a history of Roux-en-Y gastric bypass surgery and is currently seeking medical weight management due to obesity. Since her last visit, she has lost two pounds, bringing her weight down to 159 pounds from a previous high of 181 pounds in January of the previous year. Her body mass index is currently 29. She feels that her weight loss is slow and expresses concern about being on a plateau.  She has an abnormal metabolic rate, with a previous indirect calorimetry indicating her body burns approximately 950 calories, which is lower than the expected 1269 calories. She maintains a calorie intake between 1100 and 1200 calories to facilitate weight loss.  She is taking bariatric vitamins and notes that they may increase her hunger. She tracks her food intake and mentions eating yogurt with fruit. She has increased her vegetable intake but admits to not drinking enough water. She exercises five days a week, incorporating a variety of  activities.  She has difficulty sleeping, stating she goes to bed late and wakes up after about five hours, feeling sleepy throughout the day. No sleep apnea, confirmed by a sleep study. She does not have sugar cravings anymore and feels bored with her current eating routine, expressing a desire for more variety in her diet.        Challenges affecting patient progress:  Metabolic adaptations associated with Roux-en-Y, weight loss and chronic calorie restriction .    Pharmacotherapy for weight management: She is currently taking no anti-obesity medication.   Assessment and Plan   Treatment Plan For Obesity:  Recommended Dietary Goals  Robin Martinez is currently in the action stage of change. As such, her goal is to continue weight management plan. She has agreed to: continue current plan  Behavioral Health and Counseling  We discussed the following behavioral modification strategies today: continue to work on maintaining a reduced calorie state, getting the recommended amount of protein, incorporating whole foods, making healthy choices, staying well hydrated and practicing mindfulness when eating..  Additional education and resources provided today: Principles and weight management  Recommended Physical Activity Goals  Robin Martinez has been advised to work up to 150 minutes of moderate intensity aerobic activity a week and strengthening exercises 2-3 times per week for cardiovascular health, weight loss maintenance and preservation of muscle mass.   She has agreed to :  continue to gradually increase the amount and intensity of exercise routine  Pharmacotherapy  We discussed various medication options to help  Robin Martinez with her weight loss efforts and we both agreed to :  Start metformin immediate release 500 mg 1 tablet daily for diabetes prevention and weight management  Associated Conditions Impacted by Obesity Treatment  Prediabetes Assessment & Plan: Most recent A1c is  Lab Results   Component Value Date   HGBA1C 5.9 10/30/2023   HGBA1C 5.9 01/08/2013    Patient aware of disease state and risk of progression. This may contribute to abnormal cravings, fatigue and diabetic complications without having diabetes.   Treatment options include losing 7 to 10% of body weight, increasing physical activity to a goal of 150 minutes a week at moderate intensity.  After discussion of benefits and side effect she will be started on metformin immediate release 500 mg 1 tablet daily   Orders: -     metFORMIN HCl; Take 1 tablet (500 mg total) by mouth daily with breakfast.  Dispense: 30 tablet; Refill: 0  History of Roux-en-Y gastric bypass Assessment & Plan: Continue bariatric vitamin.  Upper GI from 2024 showed "IMPRESSION: Proximal gastric banding and gastrojejunostomy, with prompt preferential emptying into the proximal jejunum. No evidence of anastomotic stricture, ulcer, or contrast extravasation.   The remainder of the stomach is unremarkable in appearance, and has not been surgically divided, as seen with typical Roux-en-Y gastric bypass procedure.   Mild esophageal dysmotility.   No evidence of hiatal hernia or esophageal stricture."   Abnormal metabolism Assessment & Plan: Patient has a slower than predicted metabolism. IC 950 vs. calculated 1337.This may contribute to weight gain, chronic fatigue and difficulty losing weight. We reviewed measures to improve metabolism including progressive strengthening exercises, increasing protein intake at every meal and maintaining adequate hydration and sleep.      Generalized obesity Assessment & Plan: She is affected by obesity with a history of Roux-en-Y gastric bypass surgery. Her metabolic rate is approximately 950 calories per day, lower than the expected 1269 calories. Despite this, she has gradually lost weight from 181 pounds to 159 pounds, with a current BMI of 29. Emphasis is placed on maintaining a calorie  deficit, balanced diet, and regular exercise. Metformin was discussed as a potential aid for weight management. - Maintain calorie intake of 1100-1200 calories per day. - Continue regular exercise, varying routines to increase intensity. - Focus on balanced nutrition, including adequate protein intake. - Increase water intake. - Start metformin immediate release 500 mg once a day      General Health Maintenance Advised on the importance of hydration, balanced nutrition, and regular exercise. Emphasized the role of protein in boosting metabolism and the benefits of a varied diet. - Increase water intake. - Ensure a balanced diet with variety, including fiber, vitamins, and nutrients. - Incorporate protein-rich foods such as chicken, fish, beans, eggs, and nuts into meals.          Objective   Physical Exam:  Blood pressure 136/84, pulse 63, temperature (!) 97.3 F (36.3 C), height 5\' 2"  (1.575 m), weight 159 lb (72.1 kg), SpO2 100%. Body mass index is 29.08 kg/m.  General: She is overweight, cooperative, alert, well developed, and in no acute distress. PSYCH: Has normal mood, affect and thought process.   HEENT: EOMI, sclerae are anicteric. Lungs: Normal breathing effort, no conversational dyspnea. Extremities: No edema.  Neurologic: No gross sensory or motor deficits. No tremors or fasciculations noted.    Diagnostic Data Reviewed:  BMET    Component Value Date/Time   NA 138 10/30/2023 1431   NA 141  04/03/2023 1014   K 3.7 10/30/2023 1431   CL 100 10/30/2023 1431   CO2 29 10/30/2023 1431   GLUCOSE 79 10/30/2023 1431   BUN 17 10/30/2023 1431   BUN 24 04/03/2023 1014   CREATININE 0.78 10/30/2023 1431   CREATININE 0.90 06/02/2020 1024   CALCIUM 9.6 10/30/2023 1431   Lab Results  Component Value Date   HGBA1C 5.9 10/30/2023   HGBA1C 5.9 01/08/2013   Lab Results  Component Value Date   INSULIN 3.9 04/03/2023   Lab Results  Component Value Date   TSH 2.190  04/03/2023   CBC    Component Value Date/Time   WBC 4.3 04/03/2023 1014   WBC 4.4 01/25/2022 1032   RBC 4.95 04/03/2023 1014   RBC 4.84 01/25/2022 1032   HGB 14.4 04/03/2023 1014   HCT 42.2 04/03/2023 1014   PLT 240 04/03/2023 1014   MCV 85 04/03/2023 1014   MCH 29.1 04/03/2023 1014   MCHC 34.1 04/03/2023 1014   MCHC 33.2 01/25/2022 1032   RDW 12.5 04/03/2023 1014   Iron Studies    Component Value Date/Time   IRON 63 04/03/2023 1014   TIBC 311 04/03/2023 1014   FERRITIN 115 04/03/2023 1014   IRONPCTSAT 20 04/03/2023 1014   Lipid Panel     Component Value Date/Time   CHOL 233 (H) 04/03/2023 1014   TRIG 104 04/03/2023 1014   HDL 90 04/03/2023 1014   CHOLHDL 3 11/15/2021 0930   VLDL 16.4 11/15/2021 0930   LDLCALC 125 (H) 04/03/2023 1014   LDLCALC 130 (H) 06/02/2020 1024   Hepatic Function Panel     Component Value Date/Time   PROT 7.6 10/30/2023 1431   PROT 7.0 04/03/2023 1014   ALBUMIN 4.4 10/30/2023 1431   ALBUMIN 4.4 04/03/2023 1014   AST 20 10/30/2023 1431   ALT 16 10/30/2023 1431   ALKPHOS 97 10/30/2023 1431   BILITOT 0.8 10/30/2023 1431   BILITOT 0.8 04/03/2023 1014      Component Value Date/Time   TSH 2.190 04/03/2023 1014   Nutritional Lab Results  Component Value Date   VD25OH 64.3 04/03/2023    Medications: Outpatient Encounter Medications as of 12/12/2023  Medication Sig   amLODipine (NORVASC) 2.5 MG tablet Take 1 tablet (2.5 mg total) by mouth 2 (two) times daily.   aspirin EC 81 MG tablet Take 81 mg by mouth daily. Swallow whole.   metFORMIN (GLUCOPHAGE) 500 MG tablet Take 1 tablet (500 mg total) by mouth daily with breakfast.   Multiple Vitamin (MULTIVITAMIN) tablet Take 1 tablet by mouth daily. Bariatric Advantage   niacin 50 MG tablet Take 50 mg by mouth. Every other day   triamterene-hydrochlorothiazide (DYAZIDE) 37.5-25 MG capsule Take 1 each (1 capsule total) by mouth daily.   No facility-administered encounter medications on file as  of 12/12/2023.     Follow-Up   Return in about 4 weeks (around 01/09/2024) for For Weight Mangement with Dr. Rikki Spearing.Marland Kitchen She was informed of the importance of frequent follow up visits to maximize her success with intensive lifestyle modifications for her multiple health conditions.  Attestation Statement   Reviewed by clinician on day of visit: allergies, medications, problem list, medical history, surgical history, family history, social history, and previous encounter notes.     Worthy Rancher, MD

## 2024-01-01 ENCOUNTER — Other Ambulatory Visit (INDEPENDENT_AMBULATORY_CARE_PROVIDER_SITE_OTHER): Payer: Self-pay | Admitting: Internal Medicine

## 2024-01-01 DIAGNOSIS — R7303 Prediabetes: Secondary | ICD-10-CM

## 2024-01-11 ENCOUNTER — Encounter (INDEPENDENT_AMBULATORY_CARE_PROVIDER_SITE_OTHER): Payer: Self-pay | Admitting: Internal Medicine

## 2024-01-11 ENCOUNTER — Ambulatory Visit (INDEPENDENT_AMBULATORY_CARE_PROVIDER_SITE_OTHER): Admitting: Internal Medicine

## 2024-01-11 VITALS — BP 136/89 | HR 70 | Temp 97.7°F | Ht 62.0 in | Wt 156.0 lb

## 2024-01-11 DIAGNOSIS — E669 Obesity, unspecified: Secondary | ICD-10-CM | POA: Diagnosis not present

## 2024-01-11 DIAGNOSIS — Z6828 Body mass index (BMI) 28.0-28.9, adult: Secondary | ICD-10-CM | POA: Diagnosis not present

## 2024-01-11 DIAGNOSIS — R7303 Prediabetes: Secondary | ICD-10-CM | POA: Diagnosis not present

## 2024-01-11 DIAGNOSIS — I10 Essential (primary) hypertension: Secondary | ICD-10-CM

## 2024-01-11 DIAGNOSIS — Z9884 Bariatric surgery status: Secondary | ICD-10-CM

## 2024-01-11 NOTE — Progress Notes (Signed)
 Office: [phone]  /  Fax: [phone]  Weight Summary And Biometrics  Vitals Temp: 97.7 F (36.5 C) BP: 136/89 Pulse Rate: 70 SpO2: 97 %   Anthropometric Measurements Height: 5\' 2"  (1.575 m) Weight: 156 lb (70.8 kg) BMI (Calculated): 28.53 Weight at Last Visit: 159 lb Weight Lost Since Last Visit: 3 lb Weight Gained Since Last Visit: 0 Starting Weight: 175 lb Total Weight Loss (lbs): 19 lb (8.618 kg) Peak Weight: 203 lb   Body Composition  Body Fat %: 33.6 % Fat Mass (lbs): 52.6 lbs Muscle Mass (lbs): 98.8 lbs Total Body Water (lbs): 64.8 lbs Visceral Fat Rating : 10    No data recorded Today's Visit #: 10  Starting Date: 04/03/23   Subjective   Chief Complaint: Obesity  Interval History Discussed the use of AI scribe software for clinical note transcription with the patient, who gave verbal consent to proceed.  History of Present Illness Robin Martinez is a 73 year old female with hypertension and prediabetes who presents for medical weight management.  She has lost three pounds since her last visit. She follows a category two meal plan 70% of the time, consumes more whole foods, and meets the recommended protein intake. She exercises four days a week for about 30 minutes each session and does not skip meals.  She has hypertension and is on thiazide and amlodipine. Her blood pressure is 136/89 mmHg. She sometimes forgets to take her blood pressure medication at night, which she attributes to her elevated blood pressure at home.  She has prediabetes and was on metformin for pharmacoprophylaxis but discontinued it after five days due to intolerance. She is focusing on nutritional and behavioral strategies to manage her condition.  She has a history of Roux-en-Y gastric bypass and an abnormal metabolism. Her metabolic rate has improved with increased exercise, meal frequency, and higher protein intake. She consumes about 1200 calories, which is more than  her previously measured metabolic rate of 962 calories, and she has noticed weight loss with this regimen.  She reports not drinking enough water, which she attributes to her dry skin and waking up feeling dry. She drinks water only when she feels thirsty.    Challenges affecting patient progress: slow metabolism for age.    Pharmacotherapy for weight management: She is currently taking Metformin (off label use for incretin effect and / or insulin resistance and / or diabetes prevention) with adequate clinical response  and experiencing the following side effects: Diarrhea so medication was stopped after 5 days..   Assessment and Plan   Treatment Plan For Obesity:  Recommended Dietary Goals  Andalyn is currently in the action stage of change. As such, her goal is to continue weight management plan. She has agreed to: continue current plan  Behavioral Health and Counseling  We discussed the following behavioral modification strategies today: continue to work on maintaining a reduced calorie state, getting the recommended amount of protein, incorporating whole foods, making healthy choices, staying well hydrated and practicing mindfulness when eating..  Additional education and resources provided today: None  Recommended Physical Activity Goals  Caitlain has been advised to work up to 150 minutes of moderate intensity aerobic activity a week and strengthening exercises 2-3 times per week for cardiovascular health, weight loss maintenance and preservation of muscle mass.   She has agreed to :  Think about enjoyable ways to increase daily physical activity and overcoming barriers to exercise and Increase physical activity in their day and reduce sedentary  time (increase NEAT).  Pharmacotherapy  We discussed various medication options to help Kendalynn with her weight loss efforts and we both agreed to : continue with nutritional and behavioral strategies and has declined  pharmacotherapy  Associated Conditions Impacted by Obesity Treatment  Hypertension, essential, benign  Prediabetes  History of Roux-en-Y gastric bypass  Generalized obesity -with current BMI of 28    Assessment and Plan Assessment & Plan Obesity Lost three pounds with medical weight management. Following meal plan 70% of the time, consuming more whole foods, achieving protein intake, and exercising regularly. Goal to lose 16 more pounds to reach 140 pounds. Emphasized not skipping meals, balanced diet, and hydration. - Continue category two meal plan. - Encourage intake of whole foods and adequate protein. - Maintain exercise routine of four days a week for 30 minutes. - Set weight loss goal to 140 pounds. - Reinforce importance of not skipping meals. - Increase water intake to support metabolism and weight loss.  Hypertension Managed with thiazide and amlodipine. Blood pressure 136/89 mmHg, above target. Emphasized medication adherence, home monitoring, and risks of stroke and heart disease. Highlighted need for increased water intake with diuretics. - Take blood pressure medication daily. - Monitor blood pressure at home regularly. - Aim for blood pressure target of less than 130/80 mmHg. - Increase water intake to support diuretic use.  Prediabetes Discontinued metformin due to intolerance. Encouraged focus on nutritional and behavioral strategies. Metabolic rate improved with exercise and meal frequency. - Discontinue metformin. - Continue nutritional and behavioral strategies for prediabetes management. - Maintain increased exercise and meal frequency.  General Health Maintenance Reported inadequate water intake and dry skin. Advised increasing water intake. - Increase water intake. - Use a water bottle to track hydration.      Objective   Physical Exam:  Blood pressure 136/89, pulse 70, temperature 97.7 F (36.5 C), height 5\' 2"  (1.575 m), weight 156 lb (70.8  kg), SpO2 97%. Body mass index is 28.53 kg/m.  General: She is overweight, cooperative, alert, well developed, and in no acute distress. PSYCH: Has normal mood, affect and thought process.   HEENT: EOMI, sclerae are anicteric. Lungs: Normal breathing effort, no conversational dyspnea. Extremities: No edema.  Neurologic: No gross sensory or motor deficits. No tremors or fasciculations noted.    Diagnostic Data Reviewed:  BMET    Component Value Date/Time   NA 138 10/30/2023 1431   NA 141 04/03/2023 1014   K 3.7 10/30/2023 1431   CL 100 10/30/2023 1431   CO2 29 10/30/2023 1431   GLUCOSE 79 10/30/2023 1431   BUN 17 10/30/2023 1431   BUN 24 04/03/2023 1014   CREATININE 0.78 10/30/2023 1431   CREATININE 0.90 06/02/2020 1024   CALCIUM 9.6 10/30/2023 1431   Lab Results  Component Value Date   HGBA1C 5.9 10/30/2023   HGBA1C 5.9 01/08/2013   Lab Results  Component Value Date   INSULIN 3.9 04/03/2023   Lab Results  Component Value Date   TSH 2.190 04/03/2023   CBC    Component Value Date/Time   WBC 4.3 04/03/2023 1014   WBC 4.4 01/25/2022 1032   RBC 4.95 04/03/2023 1014   RBC 4.84 01/25/2022 1032   HGB 14.4 04/03/2023 1014   HCT 42.2 04/03/2023 1014   PLT 240 04/03/2023 1014   MCV 85 04/03/2023 1014   MCH 29.1 04/03/2023 1014   MCHC 34.1 04/03/2023 1014   MCHC 33.2 01/25/2022 1032   RDW 12.5 04/03/2023 1014   Iron  Studies    Component Value Date/Time   IRON 63 04/03/2023 1014   TIBC 311 04/03/2023 1014   FERRITIN 115 04/03/2023 1014   IRONPCTSAT 20 04/03/2023 1014   Lipid Panel     Component Value Date/Time   CHOL 233 (H) 04/03/2023 1014   TRIG 104 04/03/2023 1014   HDL 90 04/03/2023 1014   CHOLHDL 3 11/15/2021 0930   VLDL 16.4 11/15/2021 0930   LDLCALC 125 (H) 04/03/2023 1014   LDLCALC 130 (H) 06/02/2020 1024   Hepatic Function Panel     Component Value Date/Time   PROT 7.6 10/30/2023 1431   PROT 7.0 04/03/2023 1014   ALBUMIN 4.4 10/30/2023 1431    ALBUMIN 4.4 04/03/2023 1014   AST 20 10/30/2023 1431   ALT 16 10/30/2023 1431   ALKPHOS 97 10/30/2023 1431   BILITOT 0.8 10/30/2023 1431   BILITOT 0.8 04/03/2023 1014      Component Value Date/Time   TSH 2.190 04/03/2023 1014   Nutritional Lab Results  Component Value Date   VD25OH 64.3 04/03/2023    Medications: Outpatient Encounter Medications as of 01/11/2024  Medication Sig Note   amLODipine (NORVASC) 2.5 MG tablet Take 1 tablet (2.5 mg total) by mouth 2 (two) times daily.    aspirin EC 81 MG tablet Take 81 mg by mouth daily. Swallow whole.    Multiple Vitamin (MULTIVITAMIN) tablet Take 1 tablet by mouth daily. Bariatric Advantage    niacin 50 MG tablet Take 50 mg by mouth. Every other day    triamterene-hydrochlorothiazide (DYAZIDE) 37.5-25 MG capsule Take 1 each (1 capsule total) by mouth daily.    [DISCONTINUED] metFORMIN (GLUCOPHAGE) 500 MG tablet Take 1 tablet (500 mg total) by mouth daily with breakfast. (Patient not taking: Reported on 01/11/2024) 01/11/2024: Made her sick   No facility-administered encounter medications on file as of 01/11/2024.     Follow-Up   Return in about 4 weeks (around 02/08/2024) for For Weight Mangement with Dr. Rikki Spearing.Marland Kitchen She was informed of the importance of frequent follow up visits to maximize her success with intensive lifestyle modifications for her multiple health conditions.  Attestation Statement   Reviewed by clinician on day of visit: allergies, medications, problem list, medical history, surgical history, family history, social history, and previous encounter notes.     Worthy Rancher, MD

## 2024-02-08 DIAGNOSIS — Z1231 Encounter for screening mammogram for malignant neoplasm of breast: Secondary | ICD-10-CM | POA: Diagnosis not present

## 2024-02-08 LAB — HM MAMMOGRAPHY

## 2024-02-09 ENCOUNTER — Encounter: Payer: Self-pay | Admitting: Family Medicine

## 2024-02-13 ENCOUNTER — Ambulatory Visit: Admitting: Podiatry

## 2024-02-21 ENCOUNTER — Ambulatory Visit (INDEPENDENT_AMBULATORY_CARE_PROVIDER_SITE_OTHER): Admitting: Podiatry

## 2024-02-21 ENCOUNTER — Encounter: Payer: Self-pay | Admitting: Podiatry

## 2024-02-21 DIAGNOSIS — B079 Viral wart, unspecified: Secondary | ICD-10-CM | POA: Diagnosis not present

## 2024-02-21 NOTE — Progress Notes (Signed)
 Complaint of painful warts plantar feet bilaterally pain with walking and wearing shoes  Physical exam:  General appearance: Pleasant, and in no acute distress. AOx3.  Vascular: Pedal pulses: DP palpable bilaterally, PT palpable bilaterally.  Minimal edema lower legs bilaterally. Capillary fill time immediate.  Neurological: Light touch intact feet bilaterally.  No clonus or spasticity noted.   Dermatologic:   Painful circular lesions 1 on the sulcus plantar right foot near the fourth toe and the other plantar lateral foot left.  Lesions are tender with lateral pressure and have some pinpoint hemorrhages within them.  And lines partially go around the lesions.  Skin normal temperature bilaterally.  Skin normal color, tone, and texture bilaterally.   Musculoskeletal:    Radiographs: None  Diagnosis: 1.  Plantar verrucae  bilaterally x2  Plan: Discussed lesions and possible verruca versus Porro keratoma or other benign neoplasm.  Etiology and treatment -Applied Salinocaine compound to lesion(s) as noted in physical exam after debriding lesions to pinpoint bleeding.  Salinocaine applied to lesion(s) and covered with an occlusive dressing with Coban wrap.  Written and oral instructions given to patient.  -Return in 2 weeks for follow-up and reevaluation.

## 2024-03-06 ENCOUNTER — Ambulatory Visit (INDEPENDENT_AMBULATORY_CARE_PROVIDER_SITE_OTHER): Admitting: Internal Medicine

## 2024-03-06 ENCOUNTER — Encounter (INDEPENDENT_AMBULATORY_CARE_PROVIDER_SITE_OTHER): Payer: Self-pay | Admitting: Internal Medicine

## 2024-03-06 VITALS — BP 122/88 | HR 67 | Temp 97.8°F | Ht 62.0 in | Wt 156.0 lb

## 2024-03-06 DIAGNOSIS — Z9884 Bariatric surgery status: Secondary | ICD-10-CM

## 2024-03-06 DIAGNOSIS — R7303 Prediabetes: Secondary | ICD-10-CM | POA: Diagnosis not present

## 2024-03-06 DIAGNOSIS — Z6828 Body mass index (BMI) 28.0-28.9, adult: Secondary | ICD-10-CM

## 2024-03-06 DIAGNOSIS — R948 Abnormal results of function studies of other organs and systems: Secondary | ICD-10-CM

## 2024-03-06 DIAGNOSIS — E669 Obesity, unspecified: Secondary | ICD-10-CM | POA: Diagnosis not present

## 2024-03-06 NOTE — Assessment & Plan Note (Signed)
 Patient has a slower than predicted metabolism. IC 950 vs. calculated 1337.This may contribute to weight gain, chronic fatigue and difficulty losing weight. We reviewed measures to improve metabolism including progressive strengthening exercises, increasing protein intake at every meal and maintaining adequate hydration and sleep.

## 2024-03-06 NOTE — Assessment & Plan Note (Signed)
 Patient has an abnormal metabolic rate likely due to metabolic adaptations associated with chronic calorie restriction following Roux-en-Y.  We discussed the importance of increasing frequency of her meals, protein intake and physical activity to offset some of these changes.  She does not benefit from antiobesity medications.

## 2024-03-06 NOTE — Assessment & Plan Note (Signed)
 Obesity management is ongoing. Post-gastric bypass with some weight regain, she has lost 19 pounds but struggles with maintaining weight loss due to reduced appetite and meal skipping, which slows metabolism. Emphasized the importance of regular meal frequency and adequate protein intake to enhance metabolism. Discussed the psychological aspect of eating and potential benefits of behavioral health support. Advised against exercising 2-3 hours before bedtime to improve sleep quality. - Increase meal frequency and protein intake. - Encourage regular physical activity, avoiding exercise 2-3 hours before bedtime. - Consider referral to behavioral health specialist, Dr. Delaine Favorite, for support in restructuring thoughts around eating.

## 2024-03-06 NOTE — Assessment & Plan Note (Signed)
 Most recent A1c is  Lab Results  Component Value Date   HGBA1C 5.9 10/30/2023   HGBA1C 5.9 01/08/2013    Patient aware of disease state and risk of progression. This may contribute to abnormal cravings, fatigue and diabetic complications without having diabetes.   She did not tolerate metformin .  Continue with nutrition and behavioral strategies for diabetes prevention

## 2024-03-06 NOTE — Progress Notes (Signed)
 Office: [phone]  /  Fax: [phone]  Weight Summary And Biometrics  Vitals Temp: 97.8 F (36.6 C) BP: 122/88 Pulse Rate: 67 SpO2: 100 %   Anthropometric Measurements Height: 5\' 2"  (1.575 m) Weight: 156 lb (70.8 kg) BMI (Calculated): 28.53 Weight at Last Visit: 156lb Weight Lost Since Last Visit: 0lb Weight Gained Since Last Visit: 0lb Starting Weight: 175lb Total Weight Loss (lbs): 19 lb (8.618 kg) Peak Weight: 203lb   Body Composition  Body Fat %: 42.2 % Fat Mass (lbs): 66 lbs Muscle Mass (lbs): 85.8 lbs Total Body Water (lbs): 61.6 lbs Visceral Fat Rating : 11    No data recorded Today's Visit #: 11  Starting Date: 04/03/23   Subjective   Chief Complaint: Obesity  Interval History  Robin Martinez is a 73 year old female with obesity, hypertension, and prediabetes who presents for medical weight management.  She follows a 1200 calorie meal plan approximately 70-80% of the time, tracking calories and incorporating more whole foods into her diet. She feels she is not getting the recommended amount of protein and is not maintaining adequate hydration. She skips meals.  She exercises six days a week for about 30 minutes each session. She reports inadequate sleep and no high levels of stress. She has a spare room at home equipped with a treadmill and some weights, which she uses for exercise. She enjoys walking on the treadmill while watching TV or listening to the radio.  She underwent surgery which initially resulted in a 19-pound weight loss. She now experiences a loss of appetite and a reduction in meal frequency. She feels guilt after eating and struggles with the mental aspect of eating when not hungry due to the surgery's effects on her appetite. She does not weigh herself at home and has negative emotions tied to eating.  Challenges affecting patient progress: Hx of Roux en Y, menopause and slow metabolic rate.    Pharmacotherapy for weight  management: She is currently taking no anti-obesity medication.   Assessment and Plan   Treatment Plan For Obesity:  Recommended Dietary Goals  Robin Martinez is currently in the action stage of change. As such, her goal is to continue weight management plan. She has agreed to: continue current plan  Behavioral Health and Counseling  We discussed the following behavioral modification strategies today: increasing lean protein intake to established goals, avoiding skipping meals, work on meal planning and preparation, and change mindset regarding calorie restriction.  Additional education and resources provided today: None  Recommended Physical Activity Goals  Robin Martinez has been advised to work up to 150 minutes of moderate intensity aerobic activity a week and strengthening exercises 2-3 times per week for cardiovascular health, weight loss maintenance and preservation of muscle mass.   She has agreed to :  Think about enjoyable ways to increase daily physical activity and overcoming barriers to exercise and Increase physical activity in their day and reduce sedentary time (increase NEAT).  Pharmacotherapy  We discussed various medication options to help Robin Martinez with her weight loss efforts and we both agreed to : continue with nutritional and behavioral strategies  Associated Conditions Impacted by Obesity Treatment  Prediabetes Assessment & Plan: Most recent A1c is  Lab Results  Component Value Date   HGBA1C 5.9 10/30/2023   HGBA1C 5.9 01/08/2013    Patient aware of disease state and risk of progression. This may contribute to abnormal cravings, fatigue and diabetic complications without having diabetes.   She did not tolerate metformin .  Continue with nutrition and behavioral strategies for diabetes prevention    History of Roux-en-Y gastric bypass Assessment & Plan: Patient has an abnormal metabolic rate likely due to metabolic adaptations associated with chronic calorie  restriction following Roux-en-Y.  We discussed the importance of increasing frequency of her meals, protein intake and physical activity to offset some of these changes.  She does not benefit from antiobesity medications.     Abnormal metabolism Assessment & Plan: Patient has a slower than predicted metabolism. IC 950 vs. calculated 1337.This may contribute to weight gain, chronic fatigue and difficulty losing weight. We reviewed measures to improve metabolism including progressive strengthening exercises, increasing protein intake at every meal and maintaining adequate hydration and sleep.      Generalized obesity Assessment & Plan: Obesity management is ongoing. Post-gastric bypass with some weight regain, she has lost 19 pounds but struggles with maintaining weight loss due to reduced appetite and meal skipping, which slows metabolism. Emphasized the importance of regular meal frequency and adequate protein intake to enhance metabolism. Discussed the psychological aspect of eating and potential benefits of behavioral health support. Advised against exercising 2-3 hours before bedtime to improve sleep quality. - Increase meal frequency and protein intake. - Encourage regular physical activity, avoiding exercise 2-3 hours before bedtime. - Consider referral to behavioral health specialist, Dr. Delaine Favorite, for support in restructuring thoughts around eating.         Objective   Physical Exam:  Blood pressure 122/88, pulse 67, temperature 97.8 F (36.6 C), height 5\' 2"  (1.575 m), weight 156 lb (70.8 kg), SpO2 100%. Body mass index is 28.53 kg/m.  General: She is overweight, cooperative, alert, well developed, and in no acute distress. PSYCH: Has normal mood, affect and thought process.   HEENT: EOMI, sclerae are anicteric. Lungs: Normal breathing effort, no conversational dyspnea. Extremities: No edema.  Neurologic: No gross sensory or motor deficits. No tremors or fasciculations  noted.    Diagnostic Data Reviewed:  BMET    Component Value Date/Time   NA 138 10/30/2023 1431   NA 141 04/03/2023 1014   K 3.7 10/30/2023 1431   CL 100 10/30/2023 1431   CO2 29 10/30/2023 1431   GLUCOSE 79 10/30/2023 1431   BUN 17 10/30/2023 1431   BUN 24 04/03/2023 1014   CREATININE 0.78 10/30/2023 1431   CREATININE 0.90 06/02/2020 1024   CALCIUM  9.6 10/30/2023 1431   Lab Results  Component Value Date   HGBA1C 5.9 10/30/2023   HGBA1C 5.9 01/08/2013   Lab Results  Component Value Date   INSULIN  3.9 04/03/2023   Lab Results  Component Value Date   TSH 2.190 04/03/2023   CBC    Component Value Date/Time   WBC 4.3 04/03/2023 1014   WBC 4.4 01/25/2022 1032   RBC 4.95 04/03/2023 1014   RBC 4.84 01/25/2022 1032   HGB 14.4 04/03/2023 1014   HCT 42.2 04/03/2023 1014   PLT 240 04/03/2023 1014   MCV 85 04/03/2023 1014   MCH 29.1 04/03/2023 1014   MCHC 34.1 04/03/2023 1014   MCHC 33.2 01/25/2022 1032   RDW 12.5 04/03/2023 1014   Iron Studies    Component Value Date/Time   IRON 63 04/03/2023 1014   TIBC 311 04/03/2023 1014   FERRITIN 115 04/03/2023 1014   IRONPCTSAT 20 04/03/2023 1014   Lipid Panel     Component Value Date/Time   CHOL 233 (H) 04/03/2023 1014   TRIG 104 04/03/2023 1014   HDL 90 04/03/2023 1014  CHOLHDL 3 11/15/2021 0930   VLDL 16.4 11/15/2021 0930   LDLCALC 125 (H) 04/03/2023 1014   LDLCALC 130 (H) 06/02/2020 1024   Hepatic Function Panel     Component Value Date/Time   PROT 7.6 10/30/2023 1431   PROT 7.0 04/03/2023 1014   ALBUMIN 4.4 10/30/2023 1431   ALBUMIN 4.4 04/03/2023 1014   AST 20 10/30/2023 1431   ALT 16 10/30/2023 1431   ALKPHOS 97 10/30/2023 1431   BILITOT 0.8 10/30/2023 1431   BILITOT 0.8 04/03/2023 1014      Component Value Date/Time   TSH 2.190 04/03/2023 1014   Nutritional Lab Results  Component Value Date   VD25OH 64.3 04/03/2023    Medications: Outpatient Encounter Medications as of 03/06/2024   Medication Sig   amLODipine  (NORVASC ) 2.5 MG tablet Take 1 tablet (2.5 mg total) by mouth 2 (two) times daily.   aspirin  EC 81 MG tablet Take 81 mg by mouth daily. Swallow whole.   Multiple Vitamin (MULTIVITAMIN) tablet Take 1 tablet by mouth daily. Bariatric Advantage   niacin 50 MG tablet Take 50 mg by mouth. Every other day   triamterene -hydrochlorothiazide  (DYAZIDE ) 37.5-25 MG capsule Take 1 each (1 capsule total) by mouth daily. (Patient not taking: Reported on 03/06/2024)   No facility-administered encounter medications on file as of 03/06/2024.     Follow-Up   Return in about 4 weeks (around 04/03/2024) for For Weight Mangement with Dr. Allie Area.Aaron Aas She was informed of the importance of frequent follow up visits to maximize her success with intensive lifestyle modifications for her multiple health conditions.  Attestation Statement   Reviewed by clinician on day of visit: allergies, medications, problem list, medical history, surgical history, family history, social history, and previous encounter notes.     Ladd Picker, MD

## 2024-03-15 ENCOUNTER — Other Ambulatory Visit: Payer: Self-pay | Admitting: Family Medicine

## 2024-03-15 DIAGNOSIS — I10 Essential (primary) hypertension: Secondary | ICD-10-CM

## 2024-04-10 DIAGNOSIS — H43823 Vitreomacular adhesion, bilateral: Secondary | ICD-10-CM | POA: Diagnosis not present

## 2024-04-10 DIAGNOSIS — H35423 Microcystoid degeneration of retina, bilateral: Secondary | ICD-10-CM | POA: Diagnosis not present

## 2024-04-15 ENCOUNTER — Ambulatory Visit (INDEPENDENT_AMBULATORY_CARE_PROVIDER_SITE_OTHER): Admitting: Internal Medicine

## 2024-04-15 ENCOUNTER — Encounter (INDEPENDENT_AMBULATORY_CARE_PROVIDER_SITE_OTHER): Payer: Self-pay | Admitting: Internal Medicine

## 2024-04-15 VITALS — BP 141/86 | HR 69 | Temp 97.6°F | Ht 62.0 in | Wt 159.0 lb

## 2024-04-15 DIAGNOSIS — R948 Abnormal results of function studies of other organs and systems: Secondary | ICD-10-CM

## 2024-04-15 DIAGNOSIS — Z9884 Bariatric surgery status: Secondary | ICD-10-CM | POA: Diagnosis not present

## 2024-04-15 DIAGNOSIS — Z6829 Body mass index (BMI) 29.0-29.9, adult: Secondary | ICD-10-CM

## 2024-04-15 DIAGNOSIS — E66811 Obesity, class 1: Secondary | ICD-10-CM | POA: Diagnosis not present

## 2024-04-15 DIAGNOSIS — R7303 Prediabetes: Secondary | ICD-10-CM | POA: Diagnosis not present

## 2024-04-15 DIAGNOSIS — I10 Essential (primary) hypertension: Secondary | ICD-10-CM | POA: Diagnosis not present

## 2024-04-15 NOTE — Assessment & Plan Note (Signed)
 She has gained three pounds since the last visit. Reports following an 1100 calorie nutrition plan 50-60% of the time, tracking calories, and eating more whole foods. However, she is not getting the recommended amount of protein, is not maintaining adequate hydration, and has been skipping meals. Exercises three days a week for about 40 minutes doing strength and cardio. The weight gain is attributed to recent celebrations and inconsistent adherence to her nutrition plan. Discussed strategies for managing social events, including bringing healthy options and not arriving hungry. - Focus on maintaining current weight during upcoming social events. - Increase protein intake and maintain adequate hydration. - Avoid skipping meals and aim for consistent meal timing. - Bring healthy options to social gatherings. - Use smaller plates to control portion sizes and prioritize vegetables and protein over carbohydrates. - Schedule follow-up appointment in one month to monitor progress.

## 2024-04-15 NOTE — Progress Notes (Signed)
 Office: [phone]  /  Fax: [phone]  Weight Summary And Biometrics  Vitals Temp: 97.6 F (36.4 C) BP: (!) 141/86 Pulse Rate: 69 SpO2: 98 %   Anthropometric Measurements Height: 5' 2 (1.575 m) Weight: 159 lb (72.1 kg) BMI (Calculated): 29.07 Weight at Last Visit: 156 lb Weight Lost Since Last Visit: 0 lb Weight Gained Since Last Visit: 3 lb Starting Weight: 175 lb Total Weight Loss (lbs): 16 lb (7.258 kg) Peak Weight: 203 lb   Body Composition  Body Fat %: 34.7 % Fat Mass (lbs): 55.2 lbs Muscle Mass (lbs): 98.8 lbs Total Body Water (lbs): 68.6 lbs Visceral Fat Rating : 10    RMR: 950  Today's Visit #: 12  Starting Date: 04/03/23   Subjective   Chief Complaint: Obesity  Interval History  Discussed the use of AI scribe software for clinical note transcription with the patient, who gave verbal consent to proceed.  History of Present Illness   The patient is a 72 year old with a history of Roux-en-Y gastric bypass who presents for medical weight management.  Robin Martinez has gained three pounds since her last office visit. Robin Martinez follows an 1100 calorie nutrition plan 50-60% of the time, tracking her calories and eating more whole foods. However, Robin Martinez feels Robin Martinez is not getting enough protein or maintaining adequate hydration and has been skipping meals. Her exercise routine includes strength and cardio workouts three days a week for about 40 minutes each session. Robin Martinez attributes her recent weight gain to attending multiple celebrations, including three birthday parties and Father's Day, which disrupted her usual eating schedule and led to increased caloric intake.  Robin Martinez underwent Roux-en-Y gastric bypass approximately 34-35 years ago, initially reducing her weight from 203 pounds to 130 pounds within a year. Robin Martinez maintained this weight for about ten years before gradually regaining weight. Robin Martinez notes that Robin Martinez can eat more now compared to immediately post-surgery. Robin Martinez has  lost 19 pounds recently but has regained three pounds. Robin Martinez acknowledges difficulty maintaining consistency in her diet, especially when visiting others, due to differing meal schedules and food availability.  Robin Martinez has not been consistently taking her blood pressure medications but has resumed them recently. Robin Martinez reports feeling hungrier and attributes this to increased carbohydrate intake and decreased protein consumption.      Challenges affecting patient progress: Hx of Roux en Y, menopause and slow metabolic rate.    Pharmacotherapy for weight management: Robin Martinez is currently taking no anti-obesity medication.   Assessment and Plan   Treatment Plan For Obesity:  Recommended Dietary Goals  Robin Martinez is currently in the action stage of change. As such, her goal is to continue weight management plan. Robin Martinez has agreed to: continue current plan  Behavioral Health and Counseling  We discussed the following behavioral modification strategies today: increasing lean protein intake to established goals, avoiding skipping meals, work on meal planning and preparation, and change mindset regarding calorie restriction.  Avoid all or none mindset.  Also counseled on travel and holiday strategies to stay on track  Additional education and resources provided today: None  Recommended Physical Activity Goals  Robin Martinez has been advised to work up to 150 minutes of moderate intensity aerobic activity a week and strengthening exercises 2-3 times per week for cardiovascular health, weight loss maintenance and preservation of muscle mass.   Robin Martinez has agreed to :  Think about enjoyable ways to increase daily physical activity and overcoming barriers to exercise and Increase physical activity in their day and reduce  sedentary time (increase NEAT).  Resume previous levels of physical activity  Pharmacotherapy and/or medical interventions  We discussed various medication options to help Robin Martinez with her weight loss  efforts and we both agreed to : continue with nutritional and behavioral strategies.  Reviewed the role of TORe procedure  Associated Conditions Impacted by Obesity Treatment  Assessment & Plan Hypertension, essential, benign Blood pressure had improved today is elevated Robin Martinez acknowledges missing some days.  Counseled on improving adherence Prediabetes Most recent A1c is  Lab Results  Component Value Date   HGBA1C 5.9 10/30/2023   HGBA1C 5.9 01/08/2013    Patient aware of disease state and risk of progression. This may contribute to abnormal cravings, fatigue and diabetic complications without having diabetes.   Robin Martinez did not tolerate metformin .  Continue with nutrition and behavioral strategies for diabetes prevention.  Robin Martinez does not have coverage for GLP-1  History of Roux-en-Y gastric bypass Patient has an abnormal metabolic rate likely due to metabolic adaptations associated with chronic calorie restriction following Roux-en-Y.  We discussed the importance of increasing frequency of her meals, protein intake and physical activity to offset some of these changes.  Robin Martinez does not benefit from antiobesity medications.  May be a candidate for TORe, but procedure is not done here locally.  May also not be covered by her insurance.   Abnormal metabolism Patient has a slower than predicted metabolism. IC 950 vs. calculated 1337.This may contribute to weight gain, chronic fatigue and difficulty losing weight. We reviewed measures to improve metabolism including progressive strengthening exercises, increasing protein intake at every meal and maintaining adequate hydration and sleep.    Class 1 obesity with serious comorbidity and current BMI of 29 Robin Martinez has gained three pounds since the last visit. Reports following an 1100 calorie nutrition plan 50-60% of the time, tracking calories, and eating more whole foods. However, Robin Martinez is not getting the recommended amount of protein, is not maintaining adequate  hydration, and has been skipping meals. Exercises three days a week for about 40 minutes doing strength and cardio. The weight gain is attributed to recent celebrations and inconsistent adherence to her nutrition plan. Discussed strategies for managing social events, including bringing healthy options and not arriving hungry. - Focus on maintaining current weight during upcoming social events. - Increase protein intake and maintain adequate hydration. - Avoid skipping meals and aim for consistent meal timing. - Bring healthy options to social gatherings. - Use smaller plates to control portion sizes and prioritize vegetables and protein over carbohydrates. - Schedule follow-up appointment in one month to monitor progress.          Objective   Physical Exam:  Blood pressure (!) 141/86, pulse 69, temperature 97.6 F (36.4 C), height 5' 2 (1.575 m), weight 159 lb (72.1 kg), SpO2 98%. Body mass index is 29.08 kg/m.  General: Robin Martinez is overweight, cooperative, alert, well developed, and in no acute distress. PSYCH: Has normal mood, affect and thought process.   HEENT: EOMI, sclerae are anicteric. Lungs: Normal breathing effort, no conversational dyspnea. Extremities: No edema.  Neurologic: No gross sensory or motor deficits. No tremors or fasciculations noted.    Diagnostic Data Reviewed:  BMET    Component Value Date/Time   NA 138 10/30/2023 1431   NA 141 04/03/2023 1014   K 3.7 10/30/2023 1431   CL 100 10/30/2023 1431   CO2 29 10/30/2023 1431   GLUCOSE 79 10/30/2023 1431   BUN 17 10/30/2023 1431   BUN 24 04/03/2023  1014   CREATININE 0.78 10/30/2023 1431   CREATININE 0.90 06/02/2020 1024   CALCIUM  9.6 10/30/2023 1431   Lab Results  Component Value Date   HGBA1C 5.9 10/30/2023   HGBA1C 5.9 01/08/2013   Lab Results  Component Value Date   INSULIN  3.9 04/03/2023   Lab Results  Component Value Date   TSH 2.190 04/03/2023   CBC    Component Value Date/Time   WBC 4.3  04/03/2023 1014   WBC 4.4 01/25/2022 1032   RBC 4.95 04/03/2023 1014   RBC 4.84 01/25/2022 1032   HGB 14.4 04/03/2023 1014   HCT 42.2 04/03/2023 1014   PLT 240 04/03/2023 1014   MCV 85 04/03/2023 1014   MCH 29.1 04/03/2023 1014   MCHC 34.1 04/03/2023 1014   MCHC 33.2 01/25/2022 1032   RDW 12.5 04/03/2023 1014   Iron Studies    Component Value Date/Time   IRON 63 04/03/2023 1014   TIBC 311 04/03/2023 1014   FERRITIN 115 04/03/2023 1014   IRONPCTSAT 20 04/03/2023 1014   Lipid Panel     Component Value Date/Time   CHOL 233 (H) 04/03/2023 1014   TRIG 104 04/03/2023 1014   HDL 90 04/03/2023 1014   CHOLHDL 3 11/15/2021 0930   VLDL 16.4 11/15/2021 0930   LDLCALC 125 (H) 04/03/2023 1014   LDLCALC 130 (H) 06/02/2020 1024   Hepatic Function Panel     Component Value Date/Time   PROT 7.6 10/30/2023 1431   PROT 7.0 04/03/2023 1014   ALBUMIN 4.4 10/30/2023 1431   ALBUMIN 4.4 04/03/2023 1014   AST 20 10/30/2023 1431   ALT 16 10/30/2023 1431   ALKPHOS 97 10/30/2023 1431   BILITOT 0.8 10/30/2023 1431   BILITOT 0.8 04/03/2023 1014      Component Value Date/Time   TSH 2.190 04/03/2023 1014   Nutritional Lab Results  Component Value Date   VD25OH 64.3 04/03/2023    Medications: Outpatient Encounter Medications as of 04/15/2024  Medication Sig   amLODipine  (NORVASC ) 2.5 MG tablet TAKE 1 TABLET BY MOUTH TWICE  DAILY   aspirin  EC 81 MG tablet Take 81 mg by mouth daily. Swallow whole.   Multiple Vitamin (MULTIVITAMIN) tablet Take 1 tablet by mouth daily. Bariatric Advantage   niacin 50 MG tablet Take 50 mg by mouth. Every other day   triamterene -hydrochlorothiazide  (DYAZIDE ) 37.5-25 MG capsule Take 1 each (1 capsule total) by mouth daily. (Patient not taking: Reported on 03/06/2024)   No facility-administered encounter medications on file as of 04/15/2024.     Follow-Up   Return in about 4 weeks (around 05/13/2024) for For Weight Mangement with Dr. [email or web address].SABRA Robin Martinez was informed  of the importance of frequent follow up visits to maximize her success with intensive lifestyle modifications for her multiple health conditions.  Attestation Statement   Reviewed by clinician on day of visit: allergies, medications, problem list, medical history, surgical history, family history, social history, and previous encounter notes.     Lucas Francyne, MD

## 2024-04-15 NOTE — Assessment & Plan Note (Signed)
 Patient has a slower than predicted metabolism. IC 950 vs. calculated 1337.This may contribute to weight gain, chronic fatigue and difficulty losing weight. We reviewed measures to improve metabolism including progressive strengthening exercises, increasing protein intake at every meal and maintaining adequate hydration and sleep.

## 2024-04-15 NOTE — Assessment & Plan Note (Signed)
 Blood pressure had improved today is elevated she acknowledges missing some days.  Counseled on improving adherence

## 2024-04-15 NOTE — Assessment & Plan Note (Signed)
 Patient has an abnormal metabolic rate likely due to metabolic adaptations associated with chronic calorie restriction following Roux-en-Y.  We discussed the importance of increasing frequency of her meals, protein intake and physical activity to offset some of these changes.  She does not benefit from antiobesity medications.  May be a candidate for TORe, but procedure is not done here locally.  May also not be covered by her insurance.

## 2024-04-15 NOTE — Assessment & Plan Note (Signed)
 Most recent A1c is  Lab Results  Component Value Date   HGBA1C 5.9 10/30/2023   HGBA1C 5.9 01/08/2013    Patient aware of disease state and risk of progression. This may contribute to abnormal cravings, fatigue and diabetic complications without having diabetes.   She did not tolerate metformin .  Continue with nutrition and behavioral strategies for diabetes prevention.  She does not have coverage for GLP-1

## 2024-05-08 ENCOUNTER — Ambulatory Visit (INDEPENDENT_AMBULATORY_CARE_PROVIDER_SITE_OTHER): Admitting: Podiatry

## 2024-05-08 ENCOUNTER — Encounter: Payer: Self-pay | Admitting: Podiatry

## 2024-05-08 DIAGNOSIS — M7751 Other enthesopathy of right foot: Secondary | ICD-10-CM | POA: Diagnosis not present

## 2024-05-08 DIAGNOSIS — M7752 Other enthesopathy of left foot: Secondary | ICD-10-CM | POA: Diagnosis not present

## 2024-05-08 MED ORDER — TRIAMCINOLONE ACETONIDE 10 MG/ML IJ SUSP
10.0000 mg | Freq: Once | INTRAMUSCULAR | Status: AC
Start: 2024-05-08 — End: 2024-05-08
  Administered 2024-05-08: 10 mg via INTRA_ARTICULAR

## 2024-05-09 NOTE — Progress Notes (Signed)
 Subjective:   Patient ID: Robin Martinez, female   DOB: 73 y.o.   MRN: [id number]   HPI Patient is developing inflammation on the bottom of both feet with lesions that also formed that can be very sore to walk   ROS      Objective:  Physical Exam  Neurovascular status intact with inflammation pain around the fourth MPJ right and base of fifth metatarsal left of the plantar capsule tender when pressed with lesions that are also present that are nucleated with deep  lucent cores     Assessment:  Inflammatory capsulitis fourth MPJ right and plantar fifth MPJ left with patient also having significant lesion formation     Plan:  H&P reviewed and at this point sterile prep and I carefully injected the fourth MPJ right 3 mg dexamethasone Kenalog  5 mg Xylocaine  and the plantar capsule base the fifth MPJ left 3 mg Dexasone Kenalog  5 mg Xylocaine  I did deep debridement of bilateral lesions and patient will be seen back as symptoms indicate

## 2024-05-14 ENCOUNTER — Ambulatory Visit (INDEPENDENT_AMBULATORY_CARE_PROVIDER_SITE_OTHER): Admitting: Internal Medicine

## 2024-05-14 ENCOUNTER — Encounter (INDEPENDENT_AMBULATORY_CARE_PROVIDER_SITE_OTHER): Payer: Self-pay | Admitting: Internal Medicine

## 2024-05-14 VITALS — BP 138/98 | HR 72 | Temp 97.9°F | Ht 62.0 in | Wt 155.0 lb

## 2024-05-14 DIAGNOSIS — R7303 Prediabetes: Secondary | ICD-10-CM | POA: Diagnosis not present

## 2024-05-14 DIAGNOSIS — E669 Obesity, unspecified: Secondary | ICD-10-CM

## 2024-05-14 DIAGNOSIS — E7800 Pure hypercholesterolemia, unspecified: Secondary | ICD-10-CM

## 2024-05-14 DIAGNOSIS — R948 Abnormal results of function studies of other organs and systems: Secondary | ICD-10-CM

## 2024-05-14 DIAGNOSIS — Z6828 Body mass index (BMI) 28.0-28.9, adult: Secondary | ICD-10-CM

## 2024-05-14 NOTE — Assessment & Plan Note (Signed)
 Patient has a slower than predicted metabolism. IC 950 vs. calculated 1337.This may contribute to weight gain, chronic fatigue and difficulty losing weight.  This was secondary to chronic skipping of meals and inadequate food variation.  We reviewed measures to improve metabolism including progressive strengthening exercises, increasing protein intake at every meal and maintaining adequate hydration and sleep.

## 2024-05-14 NOTE — Assessment & Plan Note (Signed)
 Significant weight loss of 15% total body weight, from 181 lbs to 155 lbs, achieved over a year through lifestyle changes without medication. Current BMI is 28, with a body fat percentage reduced from 45% to 32%. Advised against rapid weight loss due to risk of muscle loss and unsustainability. Emphasized importance of body composition over BMI, noting that a BMI of 27-28 may be normal for her due to improved body composition. - Continue 1200 calorie nutrition plan with good adherence. - Maintain exercise routine of 30 minutes, 4 days a week. - Monitor weight weekly in the morning after urination and before eating. - Consider establishing with a weight management doctor in [place]  for continued support. - Avoid rapid weight loss to prevent muscle loss. - Focus on maintaining current weight and body composition.  Loose skin resulting from significant weight loss. Discussed potential for contouring surgery to address redundant skin after maintaining weight loss for six months. Emphasized that loose skin is not indicative of excess fat but rather a result of reduced body size. - Consider contouring surgery after maintaining weight loss for six months to address loose skin.

## 2024-05-14 NOTE — Assessment & Plan Note (Signed)
 LDL is not at goal. Elevated LDL may be secondary to nutrition, genetics and spillover effect from excess adiposity. Recommended LDL goal is <70 to reduce the risk of fatty streaks and the progression to obstructive ASCVD in the future. Her 10 year risk is: The 10-year ASCVD risk score (Arnett DK, et al., 2019) is: 17.6%  Lab Results  Component Value Date   CHOL 233 (H) 04/03/2023   HDL 90 04/03/2023   LDLCALC 125 (H) 04/03/2023   TRIG 104 04/03/2023   CHOLHDL 3 11/15/2021   I counseled her on cardiovascular risk.  I also reviewed home blood pressure monitoring her blood pressures are above goal.  Dr. [place] has also talked to her about her cardiovascular risk she would like to her risk with lifestyle changes but I still recommend medical therapy.  She may have a genetic basis for her cholesterol.  Continue weight loss therapy, losing 10% or more of body weight may improve condition. Also advised to reduce saturated fats in diet to less than 10% of daily calories.

## 2024-05-14 NOTE — Progress Notes (Signed)
 Office: [phone]  /  Fax: [phone]  Weight Summary and Body Composition Analysis (BIA)  Vitals Temp: 97.9 F (36.6 C) BP: (!) 138/98 Pulse Rate: 72 SpO2: 96 %   Anthropometric Measurements Height: 5' 2 (1.575 m) Weight: 155 lb (70.3 kg) BMI (Calculated): 28.34 Weight at Last Visit: 159 lb Weight Lost Since Last Visit: 4 lb Weight Gained Since Last Visit: 0 lb Starting Weight: 175 lb Total Weight Loss (lbs): 20 lb (9.072 kg) Peak Weight: 203 lb   Body Composition  Body Fat %: 32.9 % Fat Mass (lbs): 51.2 lbs Muscle Mass (lbs): 99.2 lbs Total Body Water (lbs): 64.2 lbs Visceral Fat Rating : 10    RMR: 950  Today's Visit #: 13  Starting Date: 04/03/23   Subjective   Chief Complaint: Obesity  Interval History Discussed the use of AI scribe software for clinical note transcription with the patient, who gave verbal consent to proceed.  History of Present Illness   Robin Martinez is a 73 year old female who presents for medical weight management follow-up.  She has achieved a total weight loss of twenty pounds since starting her weight management program, with a four-pound loss since her last visit. She follows a twelve hundred calorie nutrition plan with good adherence, although she occasionally skips meals and feels she is not getting the recommended amount of protein. She exercises four days a week for about thirty minutes each session.  She has maintained her weight loss for over a year without the use of medications, attributing her success to avoiding junk food, eating earlier in the day, and maintaining a consistent exercise routine. She is concerned about her body image, noting that despite her weight loss, she perceives herself as 'so fat' and is concerned about loose skin, particularly on her thighs.  Her starting weight was one hundred eighty-one pounds, and she is currently at one hundred fifty-five pounds, marking a fifteen percent total body  weight loss. Her body fat percentage has decreased from forty-five percent to thirty-two percent.  She is preparing to move to [place]  by September 3rd and plans to stay with her daughter in [place]. She has three grandchildren whom she misses and looks forward to seeing more often.       Challenges affecting patient progress: menopause and weight-centric mindset.    Pharmacotherapy for weight management: She is currently taking no anti-obesity medication.   Assessment and Plan   Treatment Plan For Obesity:  Recommended Dietary Goals  Delfina is currently in the action stage of change. As such, her goal is to continue weight management plan. She has agreed to: continue current plan  Behavioral Health and Counseling  We discussed the following behavioral modification strategies today: continue to practice mindfulness when eating, continue to work on maintaining a reduced calorie state, getting the recommended amount of protein, incorporating whole foods, making healthy choices, staying well hydrated and practicing mindfulness when eating., avoid all or none thinking, and avoid weight centric mindset.  Patient educated on the role of body composition for evaluation and management of obesity..  Additional education and resources provided today: None  Recommended Physical Activity Goals  Rozena has been advised to work up to 150 minutes of moderate intensity aerobic activity a week and strengthening exercises 2-3 times per week for cardiovascular health, weight loss maintenance and preservation of muscle mass.   She has agreed to :  Think about enjoyable ways to increase daily physical activity and overcoming barriers to exercise and Increase  physical activity in their day and reduce sedentary time (increase NEAT).  Medical Interventions and Pharmacotherapy  We discussed various medication options to help Declan with her weight loss efforts and we both agreed to : Continue with  current nutritional and behavioral strategies  Associated Conditions Impacted by Obesity Treatment  Assessment & Plan Prediabetes Most recent A1c is  Lab Results  Component Value Date   HGBA1C 5.9 10/30/2023   HGBA1C 5.9 01/08/2013    Patient aware of disease state and risk of progression. This may contribute to abnormal cravings, fatigue and diabetic complications without having diabetes.   She did not tolerate metformin .  Continue with nutrition and behavioral strategies for diabetes prevention.  She does not have coverage for GLP-1  Pure hypercholesterolemia LDL is not at goal. Elevated LDL may be secondary to nutrition, genetics and spillover effect from excess adiposity. Recommended LDL goal is <70 to reduce the risk of fatty streaks and the progression to obstructive ASCVD in the future. Her 10 year risk is: The 10-year ASCVD risk score (Arnett DK, et al., 2019) is: 17.6%  Lab Results  Component Value Date   CHOL 233 (H) 04/03/2023   HDL 90 04/03/2023   LDLCALC 125 (H) 04/03/2023   TRIG 104 04/03/2023   CHOLHDL 3 11/15/2021   I counseled her on cardiovascular risk.  I also reviewed home blood pressure monitoring her blood pressures are above goal.  Dr. [place] has also talked to her about her cardiovascular risk she would like to her risk with lifestyle changes but I still recommend medical therapy.  She may have a genetic basis for her cholesterol.  Continue weight loss therapy, losing 10% or more of body weight may improve condition. Also advised to reduce saturated fats in diet to less than 10% of daily calories.      Generalized obesity Significant weight loss of 15% total body weight, from 181 lbs to 155 lbs, achieved over a year through lifestyle changes without medication. Current BMI is 28, with a body fat percentage reduced from 45% to 32%. Advised against rapid weight loss due to risk of muscle loss and unsustainability. Emphasized importance of body composition over  BMI, noting that a BMI of 27-28 may be normal for her due to improved body composition. - Continue 1200 calorie nutrition plan with good adherence. - Maintain exercise routine of 30 minutes, 4 days a week. - Monitor weight weekly in the morning after urination and before eating. - Consider establishing with a weight management doctor in [place]  for continued support. - Avoid rapid weight loss to prevent muscle loss. - Focus on maintaining current weight and body composition.  Loose skin resulting from significant weight loss. Discussed potential for contouring surgery to address redundant skin after maintaining weight loss for six months. Emphasized that loose skin is not indicative of excess fat but rather a result of reduced body size. - Consider contouring surgery after maintaining weight loss for six months to address loose skin.   Abnormal metabolism Patient has a slower than predicted metabolism. IC 950 vs. calculated 1337.This may contribute to weight gain, chronic fatigue and difficulty losing weight.  This was secondary to chronic skipping of meals and inadequate food variation.  We reviewed measures to improve metabolism including progressive strengthening exercises, increasing protein intake at every meal and maintaining adequate hydration and sleep.    BMI 28.0-28.9,adult           Objective   Physical Exam:  Blood pressure (!) 138/98, pulse  72, temperature 97.9 F (36.6 C), height 5' 2 (1.575 m), weight 155 lb (70.3 kg), SpO2 96%. Body mass index is 28.35 kg/m.  General: She is overweight, cooperative, alert, well developed, and in no acute distress. PSYCH: Has normal mood, affect and thought process.   HEENT: EOMI, sclerae are anicteric. Lungs: Normal breathing effort, no conversational dyspnea. Extremities: No edema.  Neurologic: No gross sensory or motor deficits. No tremors or fasciculations noted.    Diagnostic Data Reviewed:  BMET    Component Value  Date/Time   NA 138 10/30/2023 1431   NA 141 04/03/2023 1014   K 3.7 10/30/2023 1431   CL 100 10/30/2023 1431   CO2 29 10/30/2023 1431   GLUCOSE 79 10/30/2023 1431   BUN 17 10/30/2023 1431   BUN 24 04/03/2023 1014   CREATININE 0.78 10/30/2023 1431   CREATININE 0.90 06/02/2020 1024   CALCIUM  9.6 10/30/2023 1431   Lab Results  Component Value Date   HGBA1C 5.9 10/30/2023   HGBA1C 5.9 01/08/2013   Lab Results  Component Value Date   INSULIN  3.9 04/03/2023   Lab Results  Component Value Date   TSH 2.190 04/03/2023   CBC    Component Value Date/Time   WBC 4.3 04/03/2023 1014   WBC 4.4 01/25/2022 1032   RBC 4.95 04/03/2023 1014   RBC 4.84 01/25/2022 1032   HGB 14.4 04/03/2023 1014   HCT 42.2 04/03/2023 1014   PLT 240 04/03/2023 1014   MCV 85 04/03/2023 1014   MCH 29.1 04/03/2023 1014   MCHC 34.1 04/03/2023 1014   MCHC 33.2 01/25/2022 1032   RDW 12.5 04/03/2023 1014   Iron Studies    Component Value Date/Time   IRON 63 04/03/2023 1014   TIBC 311 04/03/2023 1014   FERRITIN 115 04/03/2023 1014   IRONPCTSAT 20 04/03/2023 1014   Lipid Panel     Component Value Date/Time   CHOL 233 (H) 04/03/2023 1014   TRIG 104 04/03/2023 1014   HDL 90 04/03/2023 1014   CHOLHDL 3 11/15/2021 0930   VLDL 16.4 11/15/2021 0930   LDLCALC 125 (H) 04/03/2023 1014   LDLCALC 130 (H) 06/02/2020 1024   Hepatic Function Panel     Component Value Date/Time   PROT 7.6 10/30/2023 1431   PROT 7.0 04/03/2023 1014   ALBUMIN 4.4 10/30/2023 1431   ALBUMIN 4.4 04/03/2023 1014   AST 20 10/30/2023 1431   ALT 16 10/30/2023 1431   ALKPHOS 97 10/30/2023 1431   BILITOT 0.8 10/30/2023 1431   BILITOT 0.8 04/03/2023 1014      Component Value Date/Time   TSH 2.190 04/03/2023 1014   Nutritional Lab Results  Component Value Date   VD25OH 64.3 04/03/2023    Medications: Outpatient Encounter Medications as of 05/14/2024  Medication Sig   amLODipine  (NORVASC ) 2.5 MG tablet TAKE 1 TABLET BY MOUTH  TWICE  DAILY   aspirin  EC 81 MG tablet Take 81 mg by mouth daily. Swallow whole.   Multiple Vitamin (MULTIVITAMIN) tablet Take 1 tablet by mouth daily. Bariatric Advantage   niacin 50 MG tablet Take 50 mg by mouth. Every other day   triamterene -hydrochlorothiazide  (DYAZIDE ) 37.5-25 MG capsule Take 1 each (1 capsule total) by mouth daily.   No facility-administered encounter medications on file as of 05/14/2024.     Follow-Up   Return in about 4 weeks (around 06/11/2024).SABRA She was informed of the importance of frequent follow up visits to maximize her success with intensive lifestyle modifications for her  multiple health conditions.  Attestation Statement   Reviewed by clinician on day of visit: allergies, medications, problem list, medical history, surgical history, family history, social history, and previous encounter notes.     Lucas Parker, MD

## 2024-05-14 NOTE — Assessment & Plan Note (Signed)
 Most recent A1c is  Lab Results  Component Value Date   HGBA1C 5.9 10/30/2023   HGBA1C 5.9 01/08/2013    Patient aware of disease state and risk of progression. This may contribute to abnormal cravings, fatigue and diabetic complications without having diabetes.   She did not tolerate metformin .  Continue with nutrition and behavioral strategies for diabetes prevention.  She does not have coverage for GLP-1

## 2024-05-31 ENCOUNTER — Other Ambulatory Visit: Payer: Self-pay | Admitting: Family Medicine

## 2024-07-02 ENCOUNTER — Ambulatory Visit: Admitting: Family Medicine

## 2024-07-02 NOTE — Progress Notes (Deleted)
 PATIENT CHECK-IN and HEALTH RISK ASSESSMENT QUESTIONNAIRE:  -completed by phone/video for upcoming Medicare Preventive Visit  -PLEASE SELECT NOT IN PERSON for the method of visit.   FIRST check to see if the patient completed the online questionnaire - if so, this can be found under the rooming tab, then go to the questionnaires tab. Some of the questions are the same and you can use the answers to complete all of the bold questions below before calling the patient. Question #s below: 6 (fall risk screening), under habits # 1, 2, 3, 8 and 9,  under everyday activities #2, 3 and 8.   Pre-Visit Check-in: 1)Vitals (height, wt, BP, etc) - record in vitals section for visit on day of visit Request home vitals (wt, BP, etc.) and enter into vitals, THEN update Vital Signs SmartPhrase below at the top of the HPI. See below.  2)Review and Update Medications, Allergies PMH, Surgeries, Social history in Epic 3)Hospitalizations in the last year with date/reason? ***  4)Review and Update Care Team (patient's specialists) in Epic 5) Complete PHQ9 in Epic  6) Complete Fall Screening in Epic 7)Review all Health Maintenance Due and order if not done.  Medicare Wellness Patient Questionnaire:  Answer theses question about your habits: How often do you have a drink containing alcohol?*** How many drinks containing alcohol do you have on a typical day when you are drinking?*** How often do you have six or more drinks on one occasion?*** Have you ever smoked?*** Quit date if applicable? ***  How many packs a day do/did you smoke? *** Do you use smokeless tobacco?*** Do you use an illicit drugs?*** On average, how many days per week do you engage in moderate to strenuous exercise (like a brisk walk)?*** On average, how many minutes do you engage in exercise at this level?*** Are you sexually active? ***Number of partners?*** Typical breakfast**** Typical lunch*** Typical dinner*** Typical  snacks:****  Beverages: ***  Answer theses question about your everyday activities: Can you perform most household chores?*** Are you deaf or have significant trouble hearing?*** Do you feel that you have a problem with memory?*** Do you feel safe at home?*** Last dentist visit?*** 8. Do you have any difficulty performing your everyday activities?*** Are you having any difficulty walking, taking medications on your own, and or difficulty managing daily home needs?*** Do you have difficulty walking or climbing stairs?*** Do you have difficulty dressing or bathing?*** Do you have difficulty doing errands alone such as visiting a doctor's office or shopping?*** Do you currently have any difficulty preparing food and eating?*** Do you currently have any difficulty using the toilet?*** Do you have any difficulty managing your finances?*** Do you have any difficulties with housekeeping of managing your housekeeping?***   Do you have Advanced Directives in place (Living Will, Healthcare Power or Attorney)? ***   Last eye Exam and location?***   Do you currently use prescribed or non-prescribed narcotic or opioid pain medications?***  Do you have a history or close family history of breast, ovarian, tubal or peritoneal cancer or a family member with BRCA (breast cancer susceptibility 1 and 2) gene mutations?  ***Request home vitals (wt, BP, etc.) and enter into vitals, THEN update Vital Signs SmartPhrase below at the top of the HPI. See below.   Nurse/Assistant Credentials/time stamp:    ----------------------------------------------------------------------------------------------------------------------------------------------------------------------------------------------------------------------  Because this visit was a virtual/telehealth visit, some criteria may be missing or patient reported. Any vitals not documented were not able to be obtained and vitals that  have been  documented are patient reported.    MEDICARE ANNUAL PREVENTIVE VISIT WITH PROVIDER: (Welcome to [facility], initial annual wellness or annual wellness exam)  Virtual Visit via Video***Phone Note  I connected with Melvin Marmo on 07/02/24 by phone *** a video enabled telemedicine application and verified that I am speaking with the correct person using two identifiers.  Location patient: home Location provider:work or home office Persons participating in the virtual visit: patient, provider  Concerns and/or follow up today:   See HM section in Epic for other details of completed HM.    ROS: negative for report of fevers, unintentional weight loss, vision changes, vision loss, hearing loss or change, chest pain, sob, hemoptysis, melena, hematochezia, hematuria, falls, bleeding or bruising, thoughts of suicide or self harm, memory loss  Patient-completed extensive health risk assessment - reviewed and discussed with the patient: See Health Risk Assessment completed with patient prior to the visit either above or in recent phone note. This was reviewed in detailed with the patient today and appropriate recommendations, orders and referrals were placed as needed per Summary below and patient instructions.   Review of Medical History: -PMH, PSH, Family History and current specialty and care providers reviewed and updated and listed below   Patient Care Team: [place], Betty G, MD as PCP - General (Family Medicine) Dann Candyce RAMAN, MD as PCP - Cardiology (Cardiology)   Past Medical History:  Diagnosis Date   Allergy    Arthritis    Edema of both lower legs    GERD (gastroesophageal reflux disease)    Hypertension    Joint pain    Multiple food allergies    Prediabetes    Sciatic nerve pain     Past Surgical History:  Procedure Laterality Date   BREAST SURGERY     biopsy   ENDOVENOUS ABLATION SAPHENOUS VEIN W/ LASER Right 12/12/2018   endovenous laser ablation right  greater saphenous vein by Carlin Haddock MD   EYE SURGERY Left 10/2023   RIGHT/LEFT HEART CATH AND CORONARY ANGIOGRAPHY N/A 03/10/2022   Procedure: RIGHT/LEFT HEART CATH AND CORONARY ANGIOGRAPHY;  Surgeon: Verlin Lonni BIRCH, MD;  Location: [facility] INVASIVE [facility] LAB;  Service: Cardiovascular;  Laterality: N/A;   stab phlebectomy  Right 09/11/2019   stab phlebectomy > 20 incisions right leg by Carlin Haddock MD    TEE WITHOUT CARDIOVERSION N/A 03/10/2022   Procedure: TRANSESOPHAGEAL ECHOCARDIOGRAM (TEE);  Surgeon: Alvan Ronal BRAVO, MD;  Location: [facility] ENDOSCOPY;  Service: Cardiovascular;  Laterality: N/A;    Social History   Socioeconomic History   Marital status: Married    Spouse name: Lenzell   Number of children: 2   Years of education: Not on file   Highest education level: Not on file  Occupational History   Occupation: retired  Tobacco Use   Smoking status: Never   Smokeless tobacco: Never  Vaping Use   Vaping status: Never Used  Substance and Sexual Activity   Alcohol use: No   Drug use: No   Sexual activity: Not Currently  Other Topics Concern   Not on file  Social History Narrative   Not on file   Social Drivers of Health   Financial Resource Strain: Low Risk  (06/01/2022)   Overall Financial Resource Strain (CARDIA)    Difficulty of Paying Living Expenses: Not hard at all  Food Insecurity: No Food Insecurity (06/01/2022)   Hunger Vital Sign    Worried About Running Out of Food in the Last Year: Never  true    Ran Out of Food in the Last Year: Never true  Transportation Needs: No Transportation Needs (06/01/2022)   PRAPARE - Administrator, Civil Service (Medical): No    Lack of Transportation (Non-Medical): No  Physical Activity: Insufficiently Active (06/01/2022)   Exercise Vital Sign    Days of Exercise per Week: 3 days    Minutes of Exercise per Session: 30 min  Stress: No Stress Concern Present (06/01/2022)   [employer] of Occupational Health -  Occupational Stress Questionnaire    Feeling of Stress : Not at all  Social Connections: Socially Integrated (06/01/2022)   Social Connection and Isolation Panel    Frequency of Communication with Friends and Family: More than three times a week    Frequency of Social Gatherings with Friends and Family: More than three times a week    Attends Religious Services: More than 4 times per year    Active Member of [employer] or Organizations: Yes    Attends Engineer, structural: More than 4 times per year    Marital Status: Married  Catering manager Violence: Not At Risk (06/01/2022)   Humiliation, Afraid, Rape, and Kick questionnaire    Fear of Current or Ex-Partner: No    Emotionally Abused: No    Physically Abused: No    Sexually Abused: No    Family History  Problem Relation Age of Onset   Hypertension Mother    Dementia Mother    Stroke Father    Hypertension Father    Hypertension Brother    Cancer Maternal Grandmother        gyn (vaginal)   Diabetes Maternal Grandfather     Current Outpatient Medications on File Prior to Visit  Medication Sig Dispense Refill   amLODipine  (NORVASC ) 2.5 MG tablet TAKE 1 TABLET BY MOUTH TWICE  DAILY 200 tablet 2   aspirin  EC 81 MG tablet Take 81 mg by mouth daily. Swallow whole.     Multiple Vitamin (MULTIVITAMIN) tablet Take 1 tablet by mouth daily. Bariatric Advantage     niacin 50 MG tablet Take 50 mg by mouth. Every other day     triamterene -hydrochlorothiazide  (MAXZIDE -25) 37.5-25 MG tablet TAKE 1 TABLET BY MOUTH DAILY 100 tablet 0   No current facility-administered medications on file prior to visit.    Allergies  Allergen Reactions   Metformin  And Related Nausea And Vomiting    Took for 5 days and felt sick to stomach   Peanut-Containing Drug Products        Physical Exam Vitals requested from patient and listed below if patient had equipment and was able to obtain at home for this virtual visit: There were no vitals filed  for this visit. Estimated body mass index is 28.35 kg/m as calculated from the following:   Height as of 05/14/24: 5' 2 (1.575 m).   Weight as of 05/14/24: 155 lb (70.3 kg).  EKG (optional): deferred due to virtual visit  GENERAL: alert, oriented, no acute distress detected, full vision exam deferred due to pandemic and/or virtual encounter  *** HEENT: atraumatic, conjunttiva clear, no obvious abnormalities on inspection of external nose and ears  NECK: normal movements of the head and neck  LUNGS: on inspection no signs of respiratory distress, breathing rate appears normal, no obvious gross SOB, gasping or wheezing  CV: no obvious cyanosis  MS: moves all visible extremities without noticeable abnormality  PSYCH/NEURO: pleasant and cooperative, no obvious depression or anxiety, speech and  thought processing grossly intact, Cognitive function grossly intact  Flowsheet Row Office Visit from 06/26/2023 in [facility] [place]  PHQ-9 Total Score 1        06/26/2023   12:01 PM 06/06/2023   11:45 AM 12/09/2022    2:23 PM 10/14/2022    2:01 PM 08/09/2022   12:44 PM  Depression screen PHQ 2/9  Decreased Interest 0 0 0 0 0  Down, Depressed, Hopeless 0 0 0 0 0  PHQ - 2 Score 0 0 0 0 0  Altered sleeping 1 0     Tired, decreased energy 0 0     Change in appetite 0 0     Feeling bad or failure about yourself  0 1     Trouble concentrating 0 0     Moving slowly or fidgety/restless 0 0     Suicidal thoughts 0 0     PHQ-9 Score 1 1     Difficult doing work/chores Not difficult at all Not difficult at all          08/09/2022   12:44 PM 10/14/2022    2:01 PM 12/09/2022    2:23 PM 05/31/2023    4:35 PM 06/06/2023   11:45 AM  Fall Risk  Falls in the past year? 0 0 0 0 0  Was there an injury with Fall? 0 0 0 0 0  Fall Risk Category Calculator 0 0 0 0  0  Fall Risk Category (Retired) Low  Low      (RETIRED) Patient Fall Risk Level Low fall risk  Low fall risk       Patient at Risk for Falls Due to No Fall Risks No Fall Risks No Fall Risks    Fall risk Follow up Falls evaluation completed  Falls evaluation completed  Falls evaluation completed  Falls evaluation completed     Patient-reported   Data saved with a previous flowsheet row definition     SUMMARY AND PLAN:  No diagnosis found.  Visit coding *** [phone] (annual wellness visit -initial); G0439 (annual wellness subsequent); G0402 Welcome to Medicare(initial preventive physical exam)   Discussed applicable health maintenance/preventive health measures and advised and referred or ordered per patient preferences:  Health Maintenance  Topic Date Due   Pneumococcal Vaccine: 50+ Years (1 of 2 - PCV) Never done   Zoster Vaccines- Shingrix (1 of 2) Never done   Colonoscopy  10/13/2023   Influenza Vaccine  Never done   Medicare Annual Wellness (AWV)  06/05/2024   COVID-19 Vaccine (2 - 2025-26 season) 06/10/2024   Mammogram  02/07/2025   DEXA SCAN  Completed   Hepatitis C Screening  Completed   HPV VACCINES  Aged Out   Meningococcal B Vaccine  Aged Out   DTaP/Tdap/Td  Discontinued      Education and counseling on the following was provided based on the above review of health and a plan/checklist for the patient, along with additional information discussed, was provided for the patient in the patient instructions :  -Advised on importance of completing advanced directives, discussed options for completing and provided information in patient instructions as well -Provided counseling and plan for difficulty hearing  -Provided counseling and plan for increased risk of falling if applicable per above screening. Reviewed and demonstrated safe balance exercises that can be done at home to improve balance and discussed exercise guidelines for adults with include balance exercises at least 3 days per week.  -Advised and counseled on  a healthy lifestyle - including the importance of a healthy diet,  regular physical activity, social connections and stress management. -Reviewed patient's current diet. Advised and counseled on a whole foods based healthy diet. A summary of a healthy diet was provided in the Patient Instructions.  -reviewed patient's current physical activity level and discussed exercise guidelines for adults. Discussed community resources and ideas for safe exercise at home to assist in meeting exercise guideline recommendations in a safe and healthy way.  -Advise yearly dental visits at minimum and regular eye exams -Advised and counseled on alcohol safe limits, risks/ tobacco use, risks of smoking and offered counseling/help, drug, opoid use/misuse   Follow up: see patient instructions     There are no Patient Instructions on file for this visit.  Chiquita JONELLE Cramp, DO

## 2024-08-31 ENCOUNTER — Other Ambulatory Visit: Payer: Self-pay | Admitting: Family Medicine
